# Patient Record
Sex: Female | Born: 1945 | Race: White | Hispanic: No | Marital: Married | Smoking: Never smoker
Health system: Southern US, Community
[De-identification: ages and names within clinical notes are randomized; demographics above are authoritative.]

## PROBLEM LIST (undated history)

## (undated) DIAGNOSIS — E1100 Type 2 diabetes mellitus with hyperosmolarity without nonketotic hyperglycemic-hyperosmolar coma (NKHHC): Secondary | ICD-10-CM

## (undated) DIAGNOSIS — A419 Sepsis, unspecified organism: Secondary | ICD-10-CM

## (undated) DIAGNOSIS — E079 Disorder of thyroid, unspecified: Secondary | ICD-10-CM

## (undated) DIAGNOSIS — M6282 Rhabdomyolysis: Secondary | ICD-10-CM

## (undated) DIAGNOSIS — I4891 Unspecified atrial fibrillation: Secondary | ICD-10-CM

## (undated) DIAGNOSIS — E119 Type 2 diabetes mellitus without complications: Secondary | ICD-10-CM

## (undated) DIAGNOSIS — N179 Acute kidney failure, unspecified: Secondary | ICD-10-CM

## (undated) HISTORY — PX: CHOLECYSTECTOMY: SHX55

---

## 1998-08-01 ENCOUNTER — Other Ambulatory Visit: Admission: RE | Admit: 1998-08-01 | Discharge: 1998-08-01 | Payer: Self-pay | Admitting: Gynecology

## 1999-02-18 ENCOUNTER — Encounter: Payer: Self-pay | Admitting: Gynecology

## 1999-02-18 ENCOUNTER — Encounter: Admission: RE | Admit: 1999-02-18 | Discharge: 1999-02-18 | Payer: Self-pay | Admitting: Gynecology

## 2002-01-25 ENCOUNTER — Encounter: Payer: Self-pay | Admitting: Gynecology

## 2002-01-25 ENCOUNTER — Encounter: Admission: RE | Admit: 2002-01-25 | Discharge: 2002-01-25 | Payer: Self-pay | Admitting: Gynecology

## 2014-04-17 ENCOUNTER — Inpatient Hospital Stay (HOSPITAL_COMMUNITY)
Admission: EM | Admit: 2014-04-17 | Discharge: 2014-04-25 | DRG: 871 | Disposition: A | Discharge status: Skilled Nursing Facility | Payer: Medicare Other | Attending: Internal Medicine | Admitting: Internal Medicine

## 2014-04-17 ENCOUNTER — Emergency Department (HOSPITAL_COMMUNITY): Payer: Medicare Other

## 2014-04-17 ENCOUNTER — Encounter (HOSPITAL_COMMUNITY): Payer: Self-pay | Admitting: Emergency Medicine

## 2014-04-17 DIAGNOSIS — Z833 Family history of diabetes mellitus: Secondary | ICD-10-CM | POA: Diagnosis not present

## 2014-04-17 DIAGNOSIS — B372 Candidiasis of skin and nail: Secondary | ICD-10-CM | POA: Diagnosis present

## 2014-04-17 DIAGNOSIS — A419 Sepsis, unspecified organism: Secondary | ICD-10-CM | POA: Diagnosis present

## 2014-04-17 DIAGNOSIS — I493 Ventricular premature depolarization: Secondary | ICD-10-CM | POA: Diagnosis present

## 2014-04-17 DIAGNOSIS — E663 Overweight: Secondary | ICD-10-CM | POA: Diagnosis present

## 2014-04-17 DIAGNOSIS — Y92009 Unspecified place in unspecified non-institutional (private) residence as the place of occurrence of the external cause: Secondary | ICD-10-CM

## 2014-04-17 DIAGNOSIS — E1100 Type 2 diabetes mellitus with hyperosmolarity without nonketotic hyperglycemic-hyperosmolar coma (NKHHC): Secondary | ICD-10-CM | POA: Diagnosis present

## 2014-04-17 DIAGNOSIS — W19XXXA Unspecified fall, initial encounter: Secondary | ICD-10-CM | POA: Diagnosis present

## 2014-04-17 DIAGNOSIS — E876 Hypokalemia: Secondary | ICD-10-CM | POA: Diagnosis present

## 2014-04-17 DIAGNOSIS — R7989 Other specified abnormal findings of blood chemistry: Secondary | ICD-10-CM

## 2014-04-17 DIAGNOSIS — Z6841 Body Mass Index (BMI) 40.0 and over, adult: Secondary | ICD-10-CM

## 2014-04-17 DIAGNOSIS — Z8249 Family history of ischemic heart disease and other diseases of the circulatory system: Secondary | ICD-10-CM

## 2014-04-17 DIAGNOSIS — N179 Acute kidney failure, unspecified: Secondary | ICD-10-CM | POA: Diagnosis present

## 2014-04-17 DIAGNOSIS — J189 Pneumonia, unspecified organism: Secondary | ICD-10-CM | POA: Diagnosis present

## 2014-04-17 DIAGNOSIS — N183 Chronic kidney disease, stage 3 (moderate): Secondary | ICD-10-CM | POA: Diagnosis present

## 2014-04-17 DIAGNOSIS — E872 Acidosis, unspecified: Secondary | ICD-10-CM

## 2014-04-17 DIAGNOSIS — M6282 Rhabdomyolysis: Secondary | ICD-10-CM

## 2014-04-17 DIAGNOSIS — I480 Paroxysmal atrial fibrillation: Secondary | ICD-10-CM | POA: Diagnosis present

## 2014-04-17 DIAGNOSIS — M79606 Pain in leg, unspecified: Secondary | ICD-10-CM

## 2014-04-17 DIAGNOSIS — D72829 Elevated white blood cell count, unspecified: Secondary | ICD-10-CM

## 2014-04-17 DIAGNOSIS — G9341 Metabolic encephalopathy: Secondary | ICD-10-CM | POA: Diagnosis present

## 2014-04-17 DIAGNOSIS — R531 Weakness: Secondary | ICD-10-CM

## 2014-04-17 DIAGNOSIS — I4891 Unspecified atrial fibrillation: Secondary | ICD-10-CM

## 2014-04-17 DIAGNOSIS — T796XXA Traumatic ischemia of muscle, initial encounter: Secondary | ICD-10-CM

## 2014-04-17 DIAGNOSIS — Z9111 Patient's noncompliance with dietary regimen: Secondary | ICD-10-CM | POA: Diagnosis present

## 2014-04-17 DIAGNOSIS — E860 Dehydration: Secondary | ICD-10-CM | POA: Diagnosis present

## 2014-04-17 DIAGNOSIS — I248 Other forms of acute ischemic heart disease: Secondary | ICD-10-CM | POA: Diagnosis present

## 2014-04-17 DIAGNOSIS — E870 Hyperosmolality and hypernatremia: Secondary | ICD-10-CM | POA: Diagnosis present

## 2014-04-17 DIAGNOSIS — R4182 Altered mental status, unspecified: Secondary | ICD-10-CM

## 2014-04-17 DIAGNOSIS — E1101 Type 2 diabetes mellitus with hyperosmolarity with coma: Secondary | ICD-10-CM

## 2014-04-17 DIAGNOSIS — R739 Hyperglycemia, unspecified: Secondary | ICD-10-CM

## 2014-04-17 DIAGNOSIS — R778 Other specified abnormalities of plasma proteins: Secondary | ICD-10-CM | POA: Diagnosis present

## 2014-04-17 DIAGNOSIS — R799 Abnormal finding of blood chemistry, unspecified: Secondary | ICD-10-CM

## 2014-04-17 DIAGNOSIS — E8729 Other acidosis: Secondary | ICD-10-CM

## 2014-04-17 HISTORY — DX: Disorder of thyroid, unspecified: E07.9

## 2014-04-17 HISTORY — DX: Type 2 diabetes mellitus with hyperosmolarity without nonketotic hyperglycemic-hyperosmolar coma (NKHHC): E11.00

## 2014-04-17 LAB — CBC WITH DIFFERENTIAL/PLATELET
BASOS ABS: 0 10*3/uL (ref 0.0–0.1)
Basophils Relative: 0 % (ref 0–1)
Eosinophils Absolute: 0 10*3/uL (ref 0.0–0.7)
Eosinophils Relative: 0 % (ref 0–5)
HEMATOCRIT: 46.8 % — AB (ref 36.0–46.0)
Hemoglobin: 15.2 g/dL — ABNORMAL HIGH (ref 12.0–15.0)
LYMPHS ABS: 1.2 10*3/uL (ref 0.7–4.0)
LYMPHS PCT: 4 % — AB (ref 12–46)
MCH: 30.2 pg (ref 26.0–34.0)
MCHC: 32.5 g/dL (ref 30.0–36.0)
MCV: 92.9 fL (ref 78.0–100.0)
Monocytes Absolute: 1.2 10*3/uL — ABNORMAL HIGH (ref 0.1–1.0)
Monocytes Relative: 4 % (ref 3–12)
NEUTROS ABS: 26.8 10*3/uL — AB (ref 1.7–7.7)
Neutrophils Relative %: 92 % — ABNORMAL HIGH (ref 43–77)
Platelets: 485 10*3/uL — ABNORMAL HIGH (ref 150–400)
RBC: 5.04 MIL/uL (ref 3.87–5.11)
RDW: 14.2 % (ref 11.5–15.5)
WBC: 29.2 10*3/uL — ABNORMAL HIGH (ref 4.0–10.5)

## 2014-04-17 LAB — BLOOD GAS, VENOUS
Acid-Base Excess: 4 mmol/L — ABNORMAL HIGH (ref 0.0–2.0)
BICARBONATE: 28.3 meq/L — AB (ref 20.0–24.0)
Drawn by: 426371
FIO2: 0.21 %
O2 Saturation: 60.1 %
PCO2 VEN: 41.9 mmHg — AB (ref 45.0–50.0)
PH VEN: 7.442 — AB (ref 7.250–7.300)
Patient temperature: 97.8
TCO2: 24.5 mmol/L (ref 0–100)

## 2014-04-17 LAB — URINALYSIS, ROUTINE W REFLEX MICROSCOPIC
Bilirubin Urine: NEGATIVE
Glucose, UA: >1000 mg/dL — AB
Hgb urine dipstick: NEGATIVE
Ketones, ur: NEGATIVE mg/dL
Leukocytes, UA: NEGATIVE
Nitrite: NEGATIVE
Protein, ur: NEGATIVE mg/dL
SPECIFIC GRAVITY, URINE: 1.028 (ref 1.005–1.030)
UROBILINOGEN UA: 1 mg/dL (ref 0.0–1.0)
pH: 5.5 (ref 5.0–8.0)

## 2014-04-17 LAB — CBG MONITORING, ED: Glucose-Capillary: >600 mg/dL (ref 70–99)

## 2014-04-17 LAB — TROPONIN I: Troponin I: <0.3 ng/mL (ref <0.30)

## 2014-04-17 LAB — COMPREHENSIVE METABOLIC PANEL
ALT: 17 U/L (ref 0–35)
AST: 44 U/L — AB (ref 0–37)
Albumin: 2.6 g/dL — ABNORMAL LOW (ref 3.5–5.2)
Alkaline Phosphatase: 155 U/L — ABNORMAL HIGH (ref 39–117)
Anion gap: 25 — ABNORMAL HIGH (ref 5–15)
BILIRUBIN TOTAL: 1.1 mg/dL (ref 0.3–1.2)
BUN: 17 mg/dL (ref 6–23)
CO2: 25 mEq/L (ref 19–32)
Calcium: 9.4 mg/dL (ref 8.4–10.5)
Chloride: 85 mEq/L — ABNORMAL LOW (ref 96–112)
Creatinine, Ser: 1.92 mg/dL — ABNORMAL HIGH (ref 0.50–1.10)
GFR calc Af Amer: 30 mL/min — ABNORMAL LOW (ref >90)
GFR, EST NON AFRICAN AMERICAN: 26 mL/min — AB (ref >90)
Glucose, Bld: 953 mg/dL (ref 70–99)
Potassium: 2.4 mEq/L — CL (ref 3.7–5.3)
Sodium: 135 mEq/L — ABNORMAL LOW (ref 137–147)
Total Protein: 7.5 g/dL (ref 6.0–8.3)

## 2014-04-17 LAB — PROTIME-INR
INR: 1.49 (ref 0.00–1.49)
Prothrombin Time: 18.2 seconds — ABNORMAL HIGH (ref 11.6–15.2)

## 2014-04-17 LAB — URINE MICROSCOPIC-ADD ON

## 2014-04-17 LAB — PRO B NATRIURETIC PEPTIDE: PRO B NATRI PEPTIDE: 7693 pg/mL — AB (ref 0–125)

## 2014-04-17 LAB — CK: Total CK: 1917 U/L — ABNORMAL HIGH (ref 7–177)

## 2014-04-17 LAB — I-STAT CG4 LACTIC ACID, ED: LACTIC ACID, VENOUS: 8.74 mmol/L — AB (ref 0.5–2.2)

## 2014-04-17 MED ORDER — SODIUM CHLORIDE 0.9 % IV BOLUS (SEPSIS)
1000.0000 mL | Freq: Once | INTRAVENOUS | Status: AC
  Administered 2014-04-17: 1000 mL via INTRAVENOUS

## 2014-04-17 MED ORDER — PIPERACILLIN-TAZOBACTAM 3.375 G IVPB 30 MIN
3.3750 g | Freq: Once | INTRAVENOUS | Status: AC
  Administered 2014-04-17: 3.375 g via INTRAVENOUS
  Filled 2014-04-17: qty 50

## 2014-04-17 MED ORDER — SODIUM CHLORIDE 0.9 % IV SOLN
INTRAVENOUS | Status: AC
  Administered 2014-04-17: 5.4 [IU]/h via INTRAVENOUS
  Filled 2014-04-17: qty 2.5

## 2014-04-17 MED ORDER — DEXTROSE-NACL 5-0.45 % IV SOLN
INTRAVENOUS | Status: DC
  Administered 2014-04-18: 50 mL/h via INTRAVENOUS
  Administered 2014-04-19: 02:00:00 via INTRAVENOUS

## 2014-04-17 MED ORDER — VANCOMYCIN HCL IN DEXTROSE 1-5 GM/200ML-% IV SOLN
1000.0000 mg | Freq: Once | INTRAVENOUS | Status: AC
  Administered 2014-04-17: 1000 mg via INTRAVENOUS
  Filled 2014-04-17: qty 200

## 2014-04-17 NOTE — ED Notes (Signed)
CBG READ HIGH

## 2014-04-17 NOTE — ED Provider Notes (Signed)
CSN: [id number]     Arrival date & time 04/17/14  1932 History   First MD Initiated Contact with Patient 04/17/14 1938     Chief Complaint  Patient presents with  . Fall  . Hyperglycemia     (Consider location/radiation/quality/duration/timing/severity/associated sxs/prior Treatment) HPI Comments: Fall at home - was unable to get up because she was too weak. Per husband, has been sick for a while, hasn't been eating well, stopped eating food, is only drinking.   Patient is a 68 y.o. female presenting with fall and hyperglycemia. The history is provided by the patient.  Fall This is a new problem. The current episode started 6 to 12 hours ago. The problem occurs constantly. The problem has been resolved. Pertinent negatives include no chest pain, no abdominal pain and no shortness of breath. Nothing aggravates the symptoms. Nothing relieves the symptoms. She has tried nothing for the symptoms.  Hyperglycemia Associated symptoms: no abdominal pain, no chest pain, no fever, no shortness of breath and no vomiting     Past Medical History  Diagnosis Date  . Thyroid disease    Past Surgical History  Procedure Laterality Date  . Cholecystectomy     Family History  Problem Relation Age of Onset  . Osteoarthritis Mother   . Cancer Mother   . Diabetes Mother   . Heart failure Mother   . Hypertension Mother   . Osteoarthritis Father   . Cancer Father   . Osteoarthritis Sister   . Cancer Sister   . Osteoarthritis Brother   . Cancer Brother    History  Substance Use Topics  . Smoking status: Never Smoker   . Smokeless tobacco: Not on file  . Alcohol Use: No   OB History    No data available     Review of Systems  Constitutional: Negative for fever.  Respiratory: Negative for cough and shortness of breath.   Cardiovascular: Negative for chest pain.  Gastrointestinal: Negative for vomiting and abdominal pain.  Musculoskeletal: Positive for back pain.  All other systems  reviewed and are negative.     Allergies  Review of patient's allergies indicates not on file.  Home Medications   Prior to Admission medications   Not on File   BP 157/133 mmHg  Pulse 79  Temp(Src) 97.8 F (36.6 C) (Oral)  Resp 22  Ht 5\' 7"  (1.702 m)  Wt 300 lb (136.079 kg)  BMI 46.98 kg/m2  SpO2 92% Physical Exam  Constitutional: She is oriented to person, place, and time. She appears well-developed and well-nourished. No distress.  HENT:  Head: Normocephalic and atraumatic.  Mouth/Throat: Oropharynx is clear and moist.  Eyes: EOM are normal. Pupils are equal, round, and reactive to light.  Neck: Normal range of motion. Neck supple.  Cardiovascular: Normal rate and regular rhythm.  Exam reveals no friction rub.   No murmur heard. Pulmonary/Chest: Effort normal and breath sounds normal. No respiratory distress. She has no wheezes. She has no rales.  Abdominal: Soft. She exhibits no distension. There is no tenderness. There is no rebound.  Musculoskeletal: Normal range of motion. She exhibits no edema.       Cervical back: She exhibits no bony tenderness.       Thoracic back: She exhibits no bony tenderness.       Lumbar back: She exhibits no bony tenderness.  Neurological: She is alert and oriented to person, place, and time.  Skin: She is not diaphoretic.  Nursing note and vitals reviewed.  ED Course  Procedures (including critical care time) Labs Review Labs Reviewed  CULTURE, BLOOD (ROUTINE X 2)  CULTURE, BLOOD (ROUTINE X 2)  URINE CULTURE  COMPREHENSIVE METABOLIC PANEL  TROPONIN I  CBC WITH DIFFERENTIAL  PROTIME-INR  BLOOD GAS, VENOUS  PRO B NATRIURETIC PEPTIDE  URINALYSIS, ROUTINE W REFLEX MICROSCOPIC  TSH  I-STAT CG4 LACTIC ACID, ED    Imaging Review Dg Chest 2 View  04/17/2014   CLINICAL DATA:  Pt arrived via EMS with report of falling without injury d/t "legs given out" on concrete garage floor since 1300 and was found by spouse ast 1845. EMS  reported that CBG was greater than 600, poor appetite, noncompliant with diet d/t noted soft drinks and sweeten tea in house, strong urine smell, and red rash to abd folds. Pt c/o lower back pain, large bruising to left hip and groin, denies any chest complaints, best obtainable images due to pt size and condition  EXAM: CHEST  2 VIEW  COMPARISON:  None.  FINDINGS: There are low lung volumes with patient rotation to the right. The lateral view is limited. There is elevation of the right hemidiaphragm. The aorta appears ectatic and somewhat unfolded. The heart size is normal. Mild atelectasis is present at the right lung base. There is no consolidation, significant pleural effusion or pneumothorax. No acute osseous findings identified.  IMPRESSION: Elevated right hemidiaphragm with associated right basilar atelectasis. No consolidation or significant edema.   Electronically Signed   By: Roxy Horseman M.D.   On: 04/17/2014 21:36   Dg Lumbar Spine 2-3 Views  04/17/2014   CLINICAL DATA:  Fall.  Weakness.  Initial evaluation.  EXAM: LUMBAR SPINE - 2-3 VIEW  COMPARISON:  None.  FINDINGS: Five lumbar type vertebral bodies are present. There is mild levoscoliosis. Vertebral bodies are otherwise normally aligned with preservation of the normal lumbar lordosis.  There is anterior wedging of the L1 and L2 vertebral bodies, favored to be chronic in nature. Vertebral body heights are otherwise preserved. No definite acute fracture listhesis.  No soft tissue abnormality.  Moderate multilevel degenerative disc disease present within the visualized spine.  IMPRESSION: 1. No definite acute traumatic injury within the lumbar spine. 2. Anterior wedging of the L1 and L2 vertebral bodies, favored to be chronic in nature. Possible acute compression fractures are not entirely excluded. Correlation with site of pain recommended.   Electronically Signed   By: Rise Mu M.D.   On: 04/17/2014 21:37   Dg Pelvis 1-2  Views  04/17/2014   CLINICAL DATA:  Patient fell on concrete floor earlier in the day  EXAM: PELVIS - 1-2 VIEW  COMPARISON:  None.  FINDINGS: There is no evidence of pelvic fracture or dislocation. 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 No erosive change.  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 No fracture or dislocation.   Electronically Signed   By: Bretta Bang M.D.   On: 04/17/2014 21:37   Dg Tibia/fibula Left  04/17/2014   CLINICAL DATA:  Status post fall; acute onset of left lower leg pain. Initial encounter.  EXAM: LEFT TIBIA AND FIBULA - 2 VIEW  COMPARISON:  None.  FINDINGS: There is no evidence of fracture or dislocation. The tibia and fibula appear intact.  Marginal osteophytes are seen arising at the medial and lateral compartments of the knee, with wall osteophytes  also seen. No knee joint effusion is identified.  The ankle mortise is incompletely assessed, but appears grossly unremarkable. A plantar calcaneal spur is incidentally noted. No significant soft tissue abnormalities are identified.  IMPRESSION: No evidence of fracture or dislocation.   Electronically Signed   By: Roanna RaiderJeffery  Chang M.D.   On: 04/17/2014 22:52     EKG Interpretation   Date/Time:  Monday April 17 2014 19:37:09 EST Ventricular Rate:  143 PR Interval:  191 QRS Duration: 79 QT Interval:  309 QTC Calculation: 477 R Axis:   -14 Text Interpretation:  Atrial fibrillation with RVR Paired ventricular  premature complexes Inferior infarct, old Anterior infarct, old Lateral  leads are also involved No prior EKG for comparision Confirmed by Gwendolyn GrantWALDEN   MD, Lacosta Hargan [phone]([id number]) on 04/17/2014 8:43:06 PM     Angiocath insertion Performed by: Dagmar HaitWALDEN, WILLIAM Deanette Tullius  Consent: Verbal consent obtained. Risks and benefits: risks, benefits and alternatives were discussed Time out:  Immediately prior to procedure a "time out" was called to verify the correct patient, procedure, equipment, support staff and site/side marked as required.  Preparation: Patient was prepped and draped in the usual sterile fashion.  Vein Location: L AC  Yes Ultrasound Guided  Gauge: 20  Normal blood return and flush without difficulty Patient tolerance: Patient tolerated the procedure well with no immediate complications.   CRITICAL CARE Performed by: Dagmar HaitWALDEN, WILLIAM Macguire Holsinger   Total critical care time: 40 minutes  Critical care time was exclusive of separately billable procedures and treating other patients.  Critical care was necessary to treat or prevent imminent or life-threatening deterioration.  Critical care was time spent personally by me on the following activities: development of treatment plan with patient and/or surrogate as well as nursing, discussions with consultants, evaluation of patient's response to treatment, examination of patient, obtaining history from patient or surrogate, ordering and performing treatments and interventions, ordering and review of laboratory studies, ordering and review of radiographic studies, pulse oximetry and re-evaluation of patient's condition.  MDM   Final diagnoses:  Weakness  Leg pain  HHNC (hyperglycemic hyperosmolar nonketotic coma)  Hyperglycemia  Hypokalemia    86F here after a fall at home. Was too weak to get up. Spent about 6 hours on the floor, only states lower back pain. No preceding symptoms prior to the fall. Patient here with Afib with RVR on the monitor. Patient has no history other than thyroid disease, no PCP, is on no medications. No abdominal pain, no SOB, no CP. Plan on sepsis workup. Will resuscitate prior to starting diltiazem.  Lactate elevated at 8.7. Antibiotics immediately initiated. HR improving with fluids, patient's BP remain soft. Labs show hyperglycemia, hypokalemia. Sugar markedly elevated in the  900s. I spoke with Critical Care who are unable to come over immediately, but did recommended ICU admission. They will be available for consult via the video link. Dr. Konrad DoloresMerrell with the hospitalists with admit to ICU. IV started by me to make for 3 points of access.   Elwin MochaBlair Maximino Cozzolino, MD 04/17/14 2325

## 2014-04-17 NOTE — ED Notes (Signed)
Bed: WA12 Expected date:  Expected time:  Means of arrival:  Comments: Fall 

## 2014-04-17 NOTE — ED Notes (Signed)
Portable xray at bedside.

## 2014-04-17 NOTE — ED Notes (Addendum)
Pt's entire gorin area and abd folds with redness, irritation and lg amt of yeast noted and foul odor.

## 2014-04-17 NOTE — ED Notes (Signed)
Pt arrived via EMS with report of falling without injury d/t "legs given out" on concrete garage floor since 1300 and was found by spouse ast 1845. EMS reported that CBG was greater than 600, poor appetite, noncompliant with diet d/t noted soft drinks and sweeten tea in house, strong urine smell, and red rash to abd folds.

## 2014-04-17 NOTE — ED Notes (Signed)
Patient transported to X-ray 

## 2014-04-17 NOTE — ED Notes (Addendum)
Awake. Verbally responsive. A/O x4. Resp even and unlabored. No audible adventitious breath sounds noted. ABC's intact. A.Fib at 113 on monitor. Family at bedside. Pt had no adverse reaction from IV ABT.

## 2014-04-17 NOTE — H&P (Signed)
[facility] History and Physical  Natalie Renshawlma C Vannote ONG:[id number] DOB: 09/10/1945 DOA: 04/17/2014  Referring physician: Dr. Ellin SabaWalden - [facility] PCP: No primary care provider on file.   Chief Complaint: Fall  HPI: [name] is a 68 y.o. female  Pt reports becoming weak and falling at home several hours ago.  Pt reports becoming weak and having her legs give way while she was walking. Denies striking head, LOC, tongue biting, loss of bowel or bladder function, fevers, CP, SOB, Abd pain, nause or vomiting. Pt has become progressively weaker over the past several weeks. Associated w/ polyuria and polydipsia and anorexia.   LEVEL 5 CAVEAT - pt only able to remember small parts of history.  Much of history obtained from family members in the room at time of admission Family states that pt has been getting progressively sicker for 3-4 years and is in a poor home situation (per brother).   Review of Systems:  Per HPI. Pt altered   Past Medical History  Diagnosis Date  . Thyroid disease    Past Surgical History  Procedure Laterality Date  . Cholecystectomy     Social History:  reports that she has never smoked. She does not have any smokeless tobacco history on file. She reports that she does not drink alcohol or use illicit drugs.  No Known Allergies  Family History  Problem Relation Age of Onset  . Osteoarthritis Mother   . Cancer Mother   . Diabetes Mother   . Heart failure Mother   . Hypertension Mother   . Osteoarthritis Father   . Cancer Father   . Osteoarthritis Sister   . Cancer Sister   . Osteoarthritis Brother   . Cancer Brother      Prior to Admission medications   Not on File   Physical Exam: Filed Vitals:   04/17/14 2200 04/17/14 2230 04/17/14 2300 04/17/14 2330  BP: 96/70 85/53 81/53  124/69  Pulse: 113 84 107 105  Temp:      TempSrc:      Resp: 29  27   Height:      Weight:      SpO2: 96% 97% 95% 96%    Wt Readings from Last 3 Encounters:  04/17/14  136.079 kg (300 lb)    General: Appears uncomfortable  Eyes:  PERRL, normal lids, irises & conjunctiva ENT: Extremely dry mucus membranes Neck:  no LAD, masses or thyromegaly Cardiovascular: II/VI systolic murmur, irregularly irregular.  Trace LE edema  Telemetry: A-Fib Respiratory:  CTA bilaterally, no w/r/r. Normal respiratory effort. Abdomen:  soft, ntnd Skin:  no rash or induration seen on limited exam Musculoskeletal:  grossly normal tone BUE/BLE Psychiatric: AAO to person and month only  Neurologic:  grossly non-focal.          Labs on Admission:  Basic Metabolic Panel:  Recent Labs Lab 04/17/14 2033  NA 135*  K 2.4*  CL 85*  CO2 25  GLUCOSE 953*  BUN 17  CREATININE 1.92*  CALCIUM 9.4   Liver Function Tests:  Recent Labs Lab 04/17/14 2033  AST 44*  ALT 17  ALKPHOS 155*  BILITOT 1.1  PROT 7.5  ALBUMIN 2.6*   No results for input(s): LIPASE, AMYLASE in the last 168 hours. No results for input(s): AMMONIA in the last 168 hours. CBC:  Recent Labs Lab 04/17/14 2033  WBC 29.2*  NEUTROABS 26.8*  HGB 15.2*  HCT 46.8*  MCV 92.9  PLT 485*   Cardiac Enzymes:  Recent  Labs Lab 04/17/14 2033  CKTOTAL 1917*  TROPONINI <0.30    BNP (last 3 results)  Recent Labs  04/17/14 2033  PROBNP 7693.0*   CBG:  Recent Labs Lab 04/17/14 2344  GLUCAP >600*    Radiological Exams on Admission: Dg Chest 2 View  04/17/2014   CLINICAL DATA:  Pt arrived via EMS with report of falling without injury d/t "legs given out" on concrete garage floor since 1300 and was found by spouse ast 1845. EMS reported that CBG was greater than 600, poor appetite, noncompliant with diet d/t noted soft drinks and sweeten tea in house, strong urine smell, and red rash to abd folds. Pt c/o lower back pain, large bruising to left hip and groin, denies any chest complaints, best obtainable images due to pt size and condition  EXAM: CHEST  2 VIEW  COMPARISON:  None.  FINDINGS: There  are low lung volumes with patient rotation to the right. The lateral view is limited. There is elevation of the right hemidiaphragm. The aorta appears ectatic and somewhat unfolded. The heart size is normal. Mild atelectasis is present at the right lung base. There is no consolidation, significant pleural effusion or pneumothorax. No acute osseous findings identified.  IMPRESSION: Elevated right hemidiaphragm with associated right basilar atelectasis. No consolidation or significant edema.   Electronically Signed   By: Roxy HorsemanBill  Veazey M.D.   On: 04/17/2014 21:36   Dg Lumbar Spine 2-3 Views  04/17/2014   CLINICAL DATA:  Fall.  Weakness.  Initial evaluation.  EXAM: LUMBAR SPINE - 2-3 VIEW  COMPARISON:  None.  FINDINGS: Five lumbar type vertebral bodies are present. There is mild levoscoliosis. Vertebral bodies are otherwise normally aligned with preservation of the normal lumbar lordosis.  There is anterior wedging of the L1 and L2 vertebral bodies, favored to be chronic in nature. Vertebral body heights are otherwise preserved. No definite acute fracture listhesis.  No soft tissue abnormality.  Moderate multilevel degenerative disc disease present within the visualized spine.  IMPRESSION: 1. No definite acute traumatic injury within the lumbar spine. 2. Anterior wedging of the L1 and L2 vertebral bodies, favored to be chronic in nature. Possible acute compression fractures are not entirely excluded. Correlation with site of pain recommended.   Electronically Signed   By: Rise MuBenjamin  McClintock M.D.   On: 04/17/2014 21:37   Dg Pelvis 1-2 Views  04/17/2014   CLINICAL DATA:  Patient fell on concrete floor earlier in the day  EXAM: PELVIS - 1-2 VIEW  COMPARISON:  None.  FINDINGS: There is no evidence of pelvic fracture or dislocation. 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 No erosive change.  IMPRESSION: Evidence of osteoarthritic change in the hip  joints, slightly more severe on the right than on the left. No fracture or dislocation.   Electronically Signed   By: Bretta BangWilliam  Woodruff M.D.   On: 04/17/2014 21:37   Dg Tibia/fibula Left  04/17/2014   CLINICAL DATA:  Status post fall; acute onset of left lower leg pain. Initial encounter.  EXAM: LEFT TIBIA AND FIBULA - 2 VIEW  COMPARISON:  None.  FINDINGS: There is no evidence of fracture or dislocation. The tibia and fibula appear intact.  Marginal osteophytes are seen arising at the medial and lateral compartments of the knee, with wall osteophytes also seen. No knee joint effusion is identified.  The ankle mortise is incompletely assessed, but appears grossly unremarkable. A plantar calcaneal spur  is incidentally noted. No significant soft tissue abnormalities are identified.  IMPRESSION: No evidence of fracture or dislocation.   Electronically Signed   By: Roanna Raider M.D.   On: 04/17/2014 22:52    EKG: Independently reviewed. Afib, no ACS, old ant infarct  Assessment/Plan Principal Problem:   Sepsis Active Problems:   Hyperosmolar non-ketotic state in patient with type 2 diabetes mellitus   Elevated brain natriuretic peptide (BNP) level   Atrial fibrillation with RVR   Leukocytosis   Hypokalemia   Rhabdomyolysis   AKI (acute kidney injury)   High anion gap metabolic acidosis   Lactic acidosis   Altered mental status  Sepsis: unknown source. Pt altered on admission. Pt unable to reliably participate in admission process. Process likely complicated by severe HHNK state. CCM consulted and will review pts care. CXR w/o sign of acute infection. Tachycardic, tachypnea, hypotensive, Lacitc acid 8.7, WBC 29.2. Ammonia nml - Admit to ICU - Vanc/Zosyn - BCX and UCX sent - ABD CT - hypoactive BS and abd discomfort on palpation w/ altered pt  - Repeat lactic acid - lipase - ammonia  HHNK: Glucose 953 on admission. No h/o DM. No ketones in urine.  - Glucose stabilizer - A1c - D5 NS  K 153ml/hr.  - BMET Q2  Hypokalemia: 2.4 on admission. Anticipate continued drop as HHNK corrects. Unable to take PO. Anion Gap 33 (25 w/o Na correction) - K in D5 1/2 NS and piggyback ( x6) - Mag level - 2gm Mag  CHF: BNP - [id number]. Not likely in acute failure. Possible false elevation from AKI. Troponin neg. - Echo - I/O - Daily wts  AKI: Cr 1.92. No previous value to compare. Likely from longstanding undiagnosed DM - IVF as above - trend  Afib: RVR vs dehydration. Likely due acute myocardial stress from extreme hyperglycemic stress vs paroxysmal. Improving w/ fluids alone - Consider starting dilt if BP improves and HR remains elevated - Cardiology consult in the am.  - Heparin  Social: pt seen and examined w/ pts brother in the room. Husband had left for the day. There is concern by the brother that the pt is not cared for well at home. States that the pts husband is not allowing the pt to get the proper medical care that is needed adn has been in physical decline for some time.  - social work  Code Status: FULL DVT Prophylaxis: Heparin Family Communication: Brother and brother in Social worker Disposition Plan: pending improvement   Fumio Vandam J [email or web address] Password [email or web address]

## 2014-04-18 ENCOUNTER — Inpatient Hospital Stay (HOSPITAL_COMMUNITY): Payer: Medicare Other

## 2014-04-18 DIAGNOSIS — E872 Acidosis, unspecified: Secondary | ICD-10-CM

## 2014-04-18 DIAGNOSIS — M6282 Rhabdomyolysis: Secondary | ICD-10-CM

## 2014-04-18 DIAGNOSIS — A419 Sepsis, unspecified organism: Secondary | ICD-10-CM

## 2014-04-18 DIAGNOSIS — R531 Weakness: Secondary | ICD-10-CM | POA: Insufficient documentation

## 2014-04-18 DIAGNOSIS — E1101 Type 2 diabetes mellitus with hyperosmolarity with coma: Secondary | ICD-10-CM | POA: Insufficient documentation

## 2014-04-18 DIAGNOSIS — D72829 Elevated white blood cell count, unspecified: Secondary | ICD-10-CM

## 2014-04-18 DIAGNOSIS — N179 Acute kidney failure, unspecified: Secondary | ICD-10-CM

## 2014-04-18 DIAGNOSIS — R778 Other specified abnormalities of plasma proteins: Secondary | ICD-10-CM | POA: Diagnosis present

## 2014-04-18 DIAGNOSIS — E8729 Other acidosis: Secondary | ICD-10-CM

## 2014-04-18 DIAGNOSIS — B372 Candidiasis of skin and nail: Secondary | ICD-10-CM | POA: Diagnosis present

## 2014-04-18 DIAGNOSIS — I4891 Unspecified atrial fibrillation: Secondary | ICD-10-CM

## 2014-04-18 DIAGNOSIS — R739 Hyperglycemia, unspecified: Secondary | ICD-10-CM | POA: Insufficient documentation

## 2014-04-18 DIAGNOSIS — R7989 Other specified abnormal findings of blood chemistry: Secondary | ICD-10-CM | POA: Diagnosis present

## 2014-04-18 DIAGNOSIS — E876 Hypokalemia: Secondary | ICD-10-CM

## 2014-04-18 DIAGNOSIS — R4182 Altered mental status, unspecified: Secondary | ICD-10-CM

## 2014-04-18 DIAGNOSIS — I517 Cardiomegaly: Secondary | ICD-10-CM

## 2014-04-18 HISTORY — DX: Acute kidney failure, unspecified: N17.9

## 2014-04-18 HISTORY — DX: Sepsis, unspecified organism: A41.9

## 2014-04-18 HISTORY — DX: Rhabdomyolysis: M62.82

## 2014-04-18 LAB — BASIC METABOLIC PANEL
ANION GAP: 16 — AB (ref 5–15)
ANION GAP: 15 (ref 5–15)
ANION GAP: 12 (ref 5–15)
ANION GAP: 12 (ref 5–15)
Anion gap: 13 (ref 5–15)
Anion gap: 12 (ref 5–15)
BUN: 21 mg/dL (ref 6–23)
BUN: 21 mg/dL (ref 6–23)
BUN: 21 mg/dL (ref 6–23)
BUN: 22 mg/dL (ref 6–23)
BUN: 23 mg/dL (ref 6–23)
BUN: 23 mg/dL (ref 6–23)
CALCIUM: 7.7 mg/dL — AB (ref 8.4–10.5)
CALCIUM: 7.9 mg/dL — AB (ref 8.4–10.5)
CALCIUM: 8 mg/dL — AB (ref 8.4–10.5)
CHLORIDE: 105 meq/L (ref 96–112)
CHLORIDE: 104 meq/L (ref 96–112)
CHLORIDE: 109 meq/L (ref 96–112)
CHLORIDE: 108 meq/L (ref 96–112)
CO2: 27 mmol/L (ref 19–32)
CO2: 23 mmol/L (ref 19–32)
CO2: 25 mmol/L (ref 19–32)
CO2: 25 mmol/L (ref 19–32)
CO2: 24 mmol/L (ref 19–32)
CO2: 25 mmol/L (ref 19–32)
CREATININE: 2.55 mg/dL — AB (ref 0.50–1.10)
CREATININE: 2.58 mg/dL — AB (ref 0.50–1.10)
CREATININE: 2.73 mg/dL — AB (ref 0.50–1.10)
CREATININE: 2.64 mg/dL — AB (ref 0.50–1.10)
Calcium: 8 mg/dL — ABNORMAL LOW (ref 8.4–10.5)
Calcium: 8.1 mg/dL — ABNORMAL LOW (ref 8.4–10.5)
Calcium: 7.6 mg/dL — ABNORMAL LOW (ref 8.4–10.5)
Chloride: 98 mEq/L (ref 96–112)
Chloride: 106 mEq/L (ref 96–112)
Creatinine, Ser: 2.58 mg/dL — ABNORMAL HIGH (ref 0.50–1.10)
Creatinine, Ser: 2.45 mg/dL — ABNORMAL HIGH (ref 0.50–1.10)
GFR calc Af Amer: 22 mL/min — ABNORMAL LOW (ref >90)
GFR calc non Af Amer: 19 mL/min — ABNORMAL LOW (ref >90)
GFR calc non Af Amer: 18 mL/min — ABNORMAL LOW (ref >90)
GFR calc non Af Amer: 18 mL/min — ABNORMAL LOW (ref >90)
GFR calc non Af Amer: 17 mL/min — ABNORMAL LOW (ref >90)
GFR, EST AFRICAN AMERICAN: 19 mL/min — AB (ref >90)
GFR, EST AFRICAN AMERICAN: 20 mL/min — AB (ref >90)
GFR, EST AFRICAN AMERICAN: 21 mL/min — AB (ref >90)
GFR, EST AFRICAN AMERICAN: 21 mL/min — AB (ref >90)
GFR, EST AFRICAN AMERICAN: 21 mL/min — AB (ref >90)
GFR, EST NON AFRICAN AMERICAN: 17 mL/min — AB (ref >90)
GFR, EST NON AFRICAN AMERICAN: 18 mL/min — AB (ref >90)
GLUCOSE: 822 mg/dL — AB (ref 70–99)
Glucose, Bld: 353 mg/dL — ABNORMAL HIGH (ref 70–99)
Glucose, Bld: 281 mg/dL — ABNORMAL HIGH (ref 70–99)
Glucose, Bld: 204 mg/dL — ABNORMAL HIGH (ref 70–99)
Glucose, Bld: 116 mg/dL — ABNORMAL HIGH (ref 70–99)
Glucose, Bld: 131 mg/dL — ABNORMAL HIGH (ref 70–99)
POTASSIUM: 2.2 mmol/L — AB (ref 3.5–5.1)
POTASSIUM: 2.3 mmol/L — AB (ref 3.5–5.1)
Potassium: 2.1 mmol/L — CL (ref 3.5–5.1)
Potassium: 2.8 mmol/L — ABNORMAL LOW (ref 3.5–5.1)
Potassium: 2.8 mmol/L — ABNORMAL LOW (ref 3.5–5.1)
Potassium: 3 mmol/L — ABNORMAL LOW (ref 3.5–5.1)
Sodium: 138 mmol/L (ref 135–145)
Sodium: 144 mmol/L (ref 135–145)
Sodium: 144 mmol/L (ref 135–145)
Sodium: 143 mmol/L (ref 135–145)
Sodium: 145 mmol/L (ref 135–145)
Sodium: 145 mmol/L (ref 135–145)

## 2014-04-18 LAB — TSH: TSH: 3.143 u[IU]/mL (ref 0.350–4.500)

## 2014-04-18 LAB — GLUCOSE, CAPILLARY
GLUCOSE-CAPILLARY: 317 mg/dL — AB (ref 70–99)
GLUCOSE-CAPILLARY: 295 mg/dL — AB (ref 70–99)
GLUCOSE-CAPILLARY: 168 mg/dL — AB (ref 70–99)
GLUCOSE-CAPILLARY: 121 mg/dL — AB (ref 70–99)
GLUCOSE-CAPILLARY: 119 mg/dL — AB (ref 70–99)
Glucose-Capillary: 347 mg/dL — ABNORMAL HIGH (ref 70–99)
Glucose-Capillary: 426 mg/dL — ABNORMAL HIGH (ref 70–99)
Glucose-Capillary: >600 mg/dL (ref 70–99)
Glucose-Capillary: >600 mg/dL (ref 70–99)
Glucose-Capillary: >600 mg/dL (ref 70–99)
Glucose-Capillary: 191 mg/dL — ABNORMAL HIGH (ref 70–99)
Glucose-Capillary: >600 mg/dL (ref 70–99)
Glucose-Capillary: 188 mg/dL — ABNORMAL HIGH (ref 70–99)
Glucose-Capillary: 186 mg/dL — ABNORMAL HIGH (ref 70–99)
Glucose-Capillary: 136 mg/dL — ABNORMAL HIGH (ref 70–99)
Glucose-Capillary: 141 mg/dL — ABNORMAL HIGH (ref 70–99)
Glucose-Capillary: 147 mg/dL — ABNORMAL HIGH (ref 70–99)
Glucose-Capillary: 152 mg/dL — ABNORMAL HIGH (ref 70–99)

## 2014-04-18 LAB — CBC
HEMATOCRIT: 39.3 % (ref 36.0–46.0)
HEMOGLOBIN: 13.3 g/dL (ref 12.0–15.0)
MCH: 30.2 pg (ref 26.0–34.0)
MCHC: 33.8 g/dL (ref 30.0–36.0)
MCV: 89.1 fL (ref 78.0–100.0)
Platelets: 306 10*3/uL (ref 150–400)
RBC: 4.41 MIL/uL (ref 3.87–5.11)
RDW: 13.7 % (ref 11.5–15.5)
WBC: 23.5 10*3/uL — ABNORMAL HIGH (ref 4.0–10.5)

## 2014-04-18 LAB — TYPE AND SCREEN
ABO/RH(D): AB POS
Antibody Screen: NEGATIVE

## 2014-04-18 LAB — TROPONIN I
TROPONIN I: 0.21 ng/mL — AB (ref <0.031)
Troponin I: 0.18 ng/mL — ABNORMAL HIGH (ref <0.031)
Troponin I: 0.27 ng/mL — ABNORMAL HIGH (ref <0.031)
Troponin I: 0.28 ng/mL — ABNORMAL HIGH (ref <0.031)

## 2014-04-18 LAB — LIPASE, BLOOD: LIPASE: 29 U/L (ref 11–59)

## 2014-04-18 LAB — RAPID URINE DRUG SCREEN, HOSP PERFORMED
Amphetamines: NOT DETECTED
Barbiturates: NOT DETECTED
Benzodiazepines: NOT DETECTED
Cocaine: NOT DETECTED
Opiates: POSITIVE — AB
TETRAHYDROCANNABINOL: NOT DETECTED

## 2014-04-18 LAB — APTT: aPTT: 36 seconds (ref 24–37)

## 2014-04-18 LAB — MRSA PCR SCREENING: MRSA BY PCR: NEGATIVE

## 2014-04-18 LAB — ABO/RH: ABO/RH(D): AB POS

## 2014-04-18 LAB — POTASSIUM: Potassium: 3.4 mmol/L — ABNORMAL LOW (ref 3.5–5.1)

## 2014-04-18 LAB — HEPARIN LEVEL (UNFRACTIONATED): Heparin Unfractionated: 0.13 IU/mL — ABNORMAL LOW (ref 0.30–0.70)

## 2014-04-18 LAB — CORTISOL: CORTISOL PLASMA: 41.3 ug/dL

## 2014-04-18 LAB — AMMONIA: Ammonia: 35 umol/L — ABNORMAL HIGH (ref 11–32)

## 2014-04-18 LAB — MAGNESIUM: Magnesium: 1.7 mg/dL (ref 1.5–2.5)

## 2014-04-18 LAB — FIBRINOGEN: FIBRINOGEN: 674 mg/dL — AB (ref 204–475)

## 2014-04-18 LAB — LACTIC ACID, PLASMA: Lactic Acid, Venous: 5.1 mmol/L — ABNORMAL HIGH (ref 0.5–2.2)

## 2014-04-18 MED ORDER — POTASSIUM CHLORIDE 10 MEQ/100ML IV SOLN
10.0000 meq | INTRAVENOUS | Status: AC
  Administered 2014-04-18 (×6): 10 meq via INTRAVENOUS
  Filled 2014-04-18 (×5): qty 100

## 2014-04-18 MED ORDER — MORPHINE SULFATE 2 MG/ML IJ SOLN
2.0000 mg | INTRAMUSCULAR | Status: DC | PRN
  Administered 2014-04-18 – 2014-04-20 (×7): 2 mg via INTRAVENOUS
  Filled 2014-04-18 (×7): qty 1

## 2014-04-18 MED ORDER — HEPARIN BOLUS VIA INFUSION
4000.0000 [IU] | Freq: Once | INTRAVENOUS | Status: DC
  Administered 2014-04-18: 4000 [IU] via INTRAVENOUS
  Filled 2014-04-18: qty 4000

## 2014-04-18 MED ORDER — VANCOMYCIN HCL 10 G IV SOLR: 1500.0000 mg | INTRAVENOUS | Status: DC

## 2014-04-18 MED ORDER — SODIUM CHLORIDE 0.9 % IJ SOLN
3.0000 mL | Freq: Two times a day (BID) | INTRAMUSCULAR | Status: DC
  Administered 2014-04-18 – 2014-04-19 (×3): 3 mL via INTRAVENOUS

## 2014-04-18 MED ORDER — HEPARIN (PORCINE) IN NACL 100-0.45 UNIT/ML-% IJ SOLN
1800.0000 [IU]/h | INTRAMUSCULAR | Status: DC
  Administered 2014-04-18: 1200 [IU]/h via INTRAVENOUS
  Administered 2014-04-18: 1450 [IU]/h via INTRAVENOUS
  Administered 2014-04-18: 1200 [IU]/h via INTRAVENOUS
  Filled 2014-04-18 (×4): qty 250

## 2014-04-18 MED ORDER — POTASSIUM CHLORIDE IN NACL 40-0.9 MEQ/L-% IV SOLN
INTRAVENOUS | Status: DC
  Administered 2014-04-18: 125 mL/h via INTRAVENOUS
  Filled 2014-04-18 (×3): qty 1000

## 2014-04-18 MED ORDER — INSULIN ASPART 100 UNIT/ML ~~LOC~~ SOLN: 0.0000 [IU] | SUBCUTANEOUS | Status: DC

## 2014-04-18 MED ORDER — ACETAMINOPHEN 325 MG PO TABS
650.0000 mg | ORAL_TABLET | Freq: Four times a day (QID) | ORAL | Status: DC | PRN
  Administered 2014-04-24: 650 mg via ORAL
  Filled 2014-04-18: qty 2

## 2014-04-18 MED ORDER — METOPROLOL TARTRATE 1 MG/ML IV SOLN
INTRAVENOUS | Status: AC
  Administered 2014-04-18: 5 mg
  Filled 2014-04-18: qty 5

## 2014-04-18 MED ORDER — POTASSIUM CHLORIDE 10 MEQ/100ML IV SOLN
10.0000 meq | INTRAVENOUS | Status: AC
  Administered 2014-04-18 (×4): 10 meq via INTRAVENOUS
  Filled 2014-04-18 (×4): qty 100

## 2014-04-18 MED ORDER — CHLORHEXIDINE GLUCONATE 0.12 % MT SOLN
15.0000 mL | Freq: Two times a day (BID) | OROMUCOSAL | Status: DC
  Administered 2014-04-18 – 2014-04-25 (×13): 15 mL via OROMUCOSAL
  Filled 2014-04-18 (×15): qty 15

## 2014-04-18 MED ORDER — DIAZEPAM 5 MG/ML IJ SOLN
INTRAMUSCULAR | Status: AC
  Filled 2014-04-18: qty 2

## 2014-04-18 MED ORDER — POLYETHYLENE GLYCOL 3350 17 G PO PACK
17.0000 g | PACK | Freq: Every day | ORAL | Status: DC | PRN
  Filled 2014-04-18: qty 1

## 2014-04-18 MED ORDER — TERBINAFINE HCL 1 % EX CREA
TOPICAL_CREAM | Freq: Two times a day (BID) | CUTANEOUS | Status: DC
  Administered 2014-04-18 (×2): via TOPICAL
  Administered 2014-04-19 (×2): 1 via TOPICAL
  Administered 2014-04-20 – 2014-04-25 (×10): via TOPICAL
  Filled 2014-04-18: qty 12
  Filled 2014-04-18: qty 30

## 2014-04-18 MED ORDER — PIPERACILLIN-TAZOBACTAM 3.375 G IVPB
3.3750 g | Freq: Three times a day (TID) | INTRAVENOUS | Status: DC
  Administered 2014-04-18: 3.375 g via INTRAVENOUS
  Filled 2014-04-18: qty 50

## 2014-04-18 MED ORDER — METOPROLOL TARTRATE 1 MG/ML IV SOLN: 2.5000 mg | Freq: Four times a day (QID) | INTRAVENOUS | Status: DC

## 2014-04-18 MED ORDER — INSULIN ASPART 100 UNIT/ML ~~LOC~~ SOLN: 0.0000 [IU] | Freq: Every day | SUBCUTANEOUS | Status: DC

## 2014-04-18 MED ORDER — POTASSIUM CHLORIDE 10 MEQ/100ML IV SOLN
10.0000 meq | INTRAVENOUS | Status: AC
  Administered 2014-04-18 – 2014-04-19 (×6): 10 meq via INTRAVENOUS
  Filled 2014-04-18 (×4): qty 100

## 2014-04-18 MED ORDER — ONDANSETRON HCL 4 MG/2ML IJ SOLN
4.0000 mg | Freq: Four times a day (QID) | INTRAMUSCULAR | Status: DC | PRN
  Administered 2014-04-24: 4 mg via INTRAVENOUS
  Filled 2014-04-18: qty 2

## 2014-04-18 MED ORDER — LEVOFLOXACIN IN D5W 500 MG/100ML IV SOLN
500.0000 mg | Freq: Once | INTRAVENOUS | Status: AC
  Administered 2014-04-18: 500 mg via INTRAVENOUS
  Filled 2014-04-18: qty 100

## 2014-04-18 MED ORDER — CETYLPYRIDINIUM CHLORIDE 0.05 % MT LIQD
7.0000 mL | Freq: Two times a day (BID) | OROMUCOSAL | Status: DC
  Administered 2014-04-18 – 2014-04-23 (×10): 7 mL via OROMUCOSAL

## 2014-04-18 MED ORDER — LEVOFLOXACIN IN D5W 250 MG/50ML IV SOLN
250.0000 mg | INTRAVENOUS | Status: DC
  Filled 2014-04-18: qty 50

## 2014-04-18 MED ORDER — ACETAMINOPHEN 650 MG RE SUPP: 650.0000 mg | Freq: Four times a day (QID) | RECTAL | Status: DC | PRN

## 2014-04-18 MED ORDER — ONDANSETRON HCL 4 MG PO TABS: 4.0000 mg | ORAL_TABLET | Freq: Four times a day (QID) | ORAL | Status: DC | PRN

## 2014-04-18 MED ORDER — SODIUM CHLORIDE 0.9 % IV BOLUS (SEPSIS): 2500.0000 mL | Freq: Once | INTRAVENOUS | Status: DC

## 2014-04-18 MED ORDER — DIAZEPAM 5 MG/ML IJ SOLN
5.0000 mg | INTRAMUSCULAR | Status: DC | PRN
  Administered 2014-04-18 – 2014-04-22 (×2): 5 mg via INTRAVENOUS
  Filled 2014-04-18: qty 2

## 2014-04-18 MED ORDER — PERFLUTREN LIPID MICROSPHERE
1.0000 mL | Freq: Once | INTRAVENOUS | Status: AC | PRN
  Administered 2014-04-18: 2 mL via INTRAVENOUS
  Filled 2014-04-18: qty 10

## 2014-04-18 MED ORDER — KCL IN DEXTROSE-NACL 40-5-0.45 MEQ/L-%-% IV SOLN
INTRAVENOUS | Status: DC
  Filled 2014-04-18: qty 1000

## 2014-04-18 MED ORDER — INSULIN ASPART 100 UNIT/ML ~~LOC~~ SOLN
0.0000 [IU] | Freq: Three times a day (TID) | SUBCUTANEOUS | Status: DC
  Administered 2014-04-19 (×3): 5 [IU] via SUBCUTANEOUS
  Administered 2014-04-20: 3 [IU] via SUBCUTANEOUS
  Administered 2014-04-20: 5 [IU] via SUBCUTANEOUS
  Administered 2014-04-20 – 2014-04-21 (×3): 3 [IU] via SUBCUTANEOUS
  Administered 2014-04-21: 2 [IU] via SUBCUTANEOUS
  Administered 2014-04-22: 3 [IU] via SUBCUTANEOUS
  Administered 2014-04-22: 2 [IU] via SUBCUTANEOUS
  Administered 2014-04-22 – 2014-04-23 (×3): 3 [IU] via SUBCUTANEOUS
  Administered 2014-04-23: 2 [IU] via SUBCUTANEOUS
  Administered 2014-04-24: 3 [IU] via SUBCUTANEOUS
  Administered 2014-04-24: 2 [IU] via SUBCUTANEOUS
  Administered 2014-04-24: 3 [IU] via SUBCUTANEOUS

## 2014-04-18 MED ORDER — INSULIN GLARGINE 100 UNIT/ML ~~LOC~~ SOLN
10.0000 [IU] | Freq: Every day | SUBCUTANEOUS | Status: DC
  Administered 2014-04-18 – 2014-04-24 (×6): 10 [IU] via SUBCUTANEOUS
  Filled 2014-04-18 (×7): qty 0.1

## 2014-04-18 MED ORDER — INSULIN ASPART 100 UNIT/ML ~~LOC~~ SOLN
4.0000 [IU] | Freq: Three times a day (TID) | SUBCUTANEOUS | Status: DC
  Administered 2014-04-24 – 2014-04-25 (×2): 4 [IU] via SUBCUTANEOUS

## 2014-04-18 MED ORDER — SODIUM CHLORIDE 0.9 % IV SOLN
INTRAVENOUS | Status: DC
  Administered 2014-04-18: 125 mL/h via INTRAVENOUS
  Administered 2014-04-18 – 2014-04-22 (×5): via INTRAVENOUS

## 2014-04-18 MED ORDER — SODIUM CHLORIDE 0.9 % IV BOLUS (SEPSIS)
1000.0000 mL | Freq: Once | INTRAVENOUS | Status: AC
Start: 2014-04-18 — End: 2014-04-18
  Administered 2014-04-18: 1000 mL via INTRAVENOUS

## 2014-04-18 MED ORDER — METOPROLOL TARTRATE 1 MG/ML IV SOLN
5.0000 mg | Freq: Four times a day (QID) | INTRAVENOUS | Status: DC
  Administered 2014-04-18 – 2014-04-21 (×11): 5 mg via INTRAVENOUS
  Filled 2014-04-18 (×9): qty 5

## 2014-04-18 MED ORDER — MAGNESIUM SULFATE 2 GM/50ML IV SOLN
2.0000 g | Freq: Once | INTRAVENOUS | Status: AC
  Administered 2014-04-18: 2 g via INTRAVENOUS
  Filled 2014-04-18: qty 50

## 2014-04-18 MED ORDER — VANCOMYCIN HCL 10 G IV SOLR
1500.0000 mg | Freq: Once | INTRAVENOUS | Status: AC
  Administered 2014-04-18: 1500 mg via INTRAVENOUS
  Filled 2014-04-18: qty 1500

## 2014-04-18 NOTE — Progress Notes (Signed)
90 ml of urine from 7 am to 1800.

## 2014-04-18 NOTE — Progress Notes (Signed)
ANTICOAGULATION CONSULT NOTE - Initial Consult  Pharmacy Consult for IV Heparin Indication: atrial fibrillation  No Known Allergies  Patient Measurements: Height: 5\' 7"  (170.2 cm) Weight: 300 lb (136.079 kg) IBW/kg (Calculated) : 61.6 Heparin Dosing Weight: 84 kg  Vital Signs: Temp: 97.8 F (36.6 C) (12/22 0800) Temp Source: Oral (12/22 0800) BP: 104/77 mmHg (12/22 0900)  Labs:  Recent Labs  04/17/14 2033 04/18/14 0017 04/18/14 0047 04/18/14 0209  04/18/14 0650 04/18/14 0900 04/18/14 1045  HGB 15.2*  --   --  13.3  --   --   --   --   HCT 46.8*  --   --  39.3  --   --   --   --   PLT 485*  --   --  306  --   --   --   --   APTT  --   --  36  --   --   --   --   --   LABPROT 18.2*  --   --   --   --   --   --   --   INR 1.49  --   --   --   --   --   --   --   HEPARINUNFRC  --   --   --   --   --   --   --  <0.10*  CREATININE 1.92*  --  2.58*  --   < > 2.55* 2.55* 2.58*  CKTOTAL 1917*  --   --   --   --   --   --   --   TROPONINI <0.30 0.18*  --   --   --   --  0.27*  --   < > = values in this interval not displayed.  Estimated Creatinine Clearance: 29.7 mL/min (by C-G formula based on Cr of 2.58).   Medical History: Past Medical History  Diagnosis Date  . Thyroid disease     Medications:  Scheduled:  . antiseptic oral rinse  7 mL Mouth Rinse q12n4p  . chlorhexidine  15 mL Mouth Rinse BID  . insulin aspart  0-24 Units Subcutaneous 6 times per day  . [START ON 04/19/2014] levofloxacin (LEVAQUIN) IV  250 mg Intravenous Q24H  . levofloxacin (LEVAQUIN) IV  500 mg Intravenous Once  . potassium chloride  10 mEq Intravenous Q1 Hr x 6  . sodium chloride  3 mL Intravenous Q12H  . terbinafine   Topical BID   Infusions:  . sodium chloride 125 mL/hr at 04/18/14 0946  . 0.9 % NaCl with KCl 40 mEq / L 125 mL/hr at 04/18/14 0700  . dextrose 5 % and 0.45% NaCl Stopped (04/17/14 2300)  . heparin 1,200 Units/hr (04/18/14 0800)  . insulin (NOVOLIN-R) infusion 5.1  Units/hr (04/18/14 0957)    Assessment: 89 yoF admitted s/p weakness/fall.  Found to be in A-fib.  Given UFH bolus of 4000 units, then started at 1200 units/hr. PMH thyroid disease.  Note also treating for sepsis from CAP.   Hgb, plt wnl  Baseline INR, aPTT both on high end of normal  1st heparin level SUBtherapeutic at < 0.1.  Per RN, patient pulled line out at ~0900 today, and lumen noted to be bent.  Unknown if bent during removal or prior  No PTA meds per family  No plans yet for outpatient anticoagulation  Goal of Therapy:  Heparin level 0.3-0.7 units/ml Monitor platelets by anticoagulation protocol:  Yes   Plan:   Given RN report, will continue heparin as ordered and re-check a level in 6 hrs today  Daily heparin level once stable  Daily CBC  F/u for signs of bleeding or other complications  F/u transition to PO anticoag if necessary  Bernadene Personrew Fanny Agan, PharmD Pager: [phone] 04/18/2014, 11:58 AM

## 2014-04-18 NOTE — Progress Notes (Signed)
HR 110-140, Anuria, Odd posture/positioning.  Notified Dr. Darnelle Catalanama.  Dr. Darnelle Catalanama wanted verified with charge nurse.  Orders received.

## 2014-04-18 NOTE — Progress Notes (Signed)
Utilization review completed.  

## 2014-04-18 NOTE — ED Notes (Signed)
Dr. Konrad DoloresMerrell called and made aware of Potassium at 2.4 and reported currently putting in orders and it is OK for pt to be transferred to ICU.

## 2014-04-18 NOTE — Progress Notes (Signed)
Echocardiogram 2D Echocardiogram with Definity has been performed.  [name] 04/18/2014, 1:49 PM

## 2014-04-18 NOTE — Progress Notes (Signed)
ANTIBIOTIC CONSULT NOTE - INITIAL  Pharmacy Consult for Zosyn and Vancomycin >> Levofloxacin Indication: Sepsis d/t CAP  No Known Allergies  Patient Measurements: Height: 5\' 7"  (170.2 cm) Weight: 300 lb (136.079 kg) IBW/kg (Calculated) : 61.6   Vital Signs: Temp: 99.3 F (37.4 C) (12/22 0339) Temp Source: Axillary (12/22 0339) BP: 104/77 mmHg (12/22 0900) Pulse Rate: 105 (12/21 2330) Intake/Output from previous day: 12/21 0701 - 12/22 0700 In: 1702.4 [I.V.:652.4; IV Piggyback:1050] Out: 30 [Urine:30] Intake/Output from this shift: Total I/O In: 112 [I.V.:12; IV Piggyback:100] Out: 10 [Urine:10]  Labs:  Recent Labs  04/17/14 2033  04/18/14 0209 04/18/14 0515 04/18/14 0650 04/18/14 0900  WBC 29.2*  --  23.5*  --   --   --   HGB 15.2*  --  13.3  --   --   --   PLT 485*  --  306  --   --   --   CREATININE 1.92*  < >  --  2.45* 2.55* 2.55*  < > = values in this interval not displayed. Estimated Creatinine Clearance: 30 mL/min (by C-G formula based on Cr of 2.55). No results for input(s): VANCOTROUGH, VANCOPEAK, VANCORANDOM, GENTTROUGH, GENTPEAK, GENTRANDOM, TOBRATROUGH, TOBRAPEAK, TOBRARND, AMIKACINPEAK, AMIKACINTROU, AMIKACIN in the last 72 hours.   Microbiology: Recent Results (from the past 720 hour(s))  MRSA PCR Screening     Status: None   Collection Time: 04/18/14  3:55 AM  Result Value Ref Range Status   MRSA by PCR NEGATIVE NEGATIVE Final    Comment:        The GeneXpert MRSA Assay (FDA approved for NASAL specimens only), is one component of a comprehensive MRSA colonization surveillance program. It is not intended to diagnose MRSA infection nor to guide or monitor treatment for MRSA infections.     Medical History: Past Medical History  Diagnosis Date  . Thyroid disease     Medications:  Scheduled:  . antiseptic oral rinse  7 mL Mouth Rinse q12n4p  . chlorhexidine  15 mL Mouth Rinse BID  . insulin aspart  0-24 Units Subcutaneous 6 times  per day  . potassium chloride  10 mEq Intravenous Q1 Hr x 6  . sodium chloride  3 mL Intravenous Q12H  . terbinafine   Topical BID   Infusions:  . sodium chloride 125 mL/hr at 04/18/14 0946  . 0.9 % NaCl with KCl 40 mEq / L 125 mL/hr at 04/18/14 0700  . dextrose 5 % and 0.45% NaCl Stopped (04/17/14 2300)  . heparin 1,200 Units/hr (04/18/14 0800)  . insulin (NOVOLIN-R) infusion 5.1 Units/hr (04/18/14 0957)   Assessment: [age] yoF c/o weakness/fall at home.  CBG=953 on admission.  Started on Zosyn and Vancomycin per Rx for Sepsis.  CT shows RLL pna, CXR w/o significant infiltrates.  UA not c/w UTI.  MD would like to narrow antibiotics to levofloxacin.  12/21 >> zosyn >> 12/22 12/21 >> vancomycin >> 12/22 12/22 >> levofloxacin >>   Tmax: Afebrile WBCs: Elevated, trending down Renal: AKI - SCr increasing after admission, now stable around 2.55; CrCl 30 CG, 23 Norm QTc 424 ms  12/21 blood: IP 12/21 urine: IP 12/21 sputum:  12/21 MRSA swab: negative   Goal of Therapy:  Eradication of infection Appropriate dosing of antibiotics for renal function and indication   Plan:   Levofloxacin 500 mg IV now  Start levofloxacin 250 mg IV q24 tomorrow at 1200  F/u Scr/levels/cultures as needed  F/u LOT, opportunity for IV-PO conversion  [employer]  Sonora Catlin, PharmD Pager: [phone] 04/18/2014, 11:28 AM

## 2014-04-18 NOTE — Progress Notes (Signed)
ANTICOAGULATION CONSULT NOTE - Initial Consult  Pharmacy Consult for IV Heparin Indication: atrial fibrillation  No Known Allergies  Patient Measurements: Height: 5\' 7"  (170.2 cm) Weight: 300 lb (136.079 kg) IBW/kg (Calculated) : 61.6 Heparin Dosing Weight: 84 kg  Vital Signs: Temp: 97.8 F (36.6 C) (12/21 1938) Temp Source: Oral (12/21 1938) BP: 124/69 mmHg (12/21 2330) Pulse Rate: 105 (12/21 2330)  Labs:  Recent Labs  04/17/14 2033  HGB 15.2*  HCT 46.8*  PLT 485*  LABPROT 18.2*  INR 1.49  CREATININE 1.92*  CKTOTAL 1917*  TROPONINI <0.30    Estimated Creatinine Clearance: 39.9 mL/min (by C-G formula based on Cr of 1.92).   Medical History: Past Medical History  Diagnosis Date  . Thyroid disease     Medications:  Scheduled:  . heparin  4,000 Units Intravenous Once  . insulin aspart  0-24 Units Subcutaneous 6 times per day  . magnesium sulfate 1 - 4 g bolus IVPB  2 g Intravenous Once  . piperacillin-tazobactam (ZOSYN)  IV  3.375 g Intravenous 3 times per day  . potassium chloride  10 mEq Intravenous Q1 Hr x 6  . sodium chloride  2,500 mL Intravenous Once  . sodium chloride  3 mL Intravenous Q12H  . vancomycin  1,500 mg Intravenous Once  . vancomycin  1,500 mg Intravenous Q24H   Infusions:  . dextrose 5 % and 0.45 % NaCl with KCl 40 mEq/L    . dextrose 5 % and 0.45% NaCl    . heparin    . insulin (NOVOLIN-R) infusion 5.4 Units/hr (04/17/14 2349)    Assessment: [age] yoF admitted s/p weakness/fall.  Found to be in A-fib.  Per husband pt takes no medications at home. IV heparin per Rx for A-fib.  Goal of Therapy:  Heparin level 0.3-0.7 units/ml Monitor platelets by anticoagulation protocol: Yes   Plan:   Baseline aPtt now  Heparin 4000 unit bolus x1  Start drip @ 1200 units/hr  Daily CBC/HL  Check 1st HL in 8 hours  Susanne GreenhouseGreen, Christopher Hink R 04/18/2014,1:32 AM

## 2014-04-18 NOTE — Progress Notes (Signed)
CRITICAL VALUE ALERT  Critical value received:  822  Date of notification:  04/18/14  Time of notification:  0309  Critical value read back: yes  Nurse who received alert: T.Naara Kelty, RN  MD notified (1st page):  K.Schorr, NP  Time of first page:  0310

## 2014-04-18 NOTE — ED Notes (Signed)
Awake. Verbally responsive. A/O x4. Resp even and unlabored. No audible adventitious breath sounds noted. ABC's intact. A.Fib on monitor. Family at bedside. Insulin drip infusing at 5.454ml/hr without difficulty. No adverse reaction noted from IV ABT's. NS infusing at 3050ml/hr.

## 2014-04-18 NOTE — Progress Notes (Signed)
CRITICAL VALUE ALERT  Critical value received: Potassium 2.2  Date of notification:  04/18/14  Time of notification:  0625  Critical value read back:Yes.    Nurse who received alert:  T.Lenus Trauger,RN  MD notified (1st page):  Merdis DelayK. Schorr, NP  Time of first page:  [phone]

## 2014-04-18 NOTE — Progress Notes (Signed)
ANTIBIOTIC CONSULT NOTE - INITIAL  Pharmacy Consult for Zosyn and Vancomycin Indication: Sepsis  No Known Allergies  Patient Measurements: Height: 5\' 7"  (170.2 cm) Weight: 300 lb (136.079 kg) IBW/kg (Calculated) : 61.6   Vital Signs: Temp: 97.8 F (36.6 C) (12/21 1938) Temp Source: Oral (12/21 1938) BP: 124/69 mmHg (12/21 2330) Pulse Rate: 105 (12/21 2330) Intake/Output from previous day:   Intake/Output from this shift:    Labs:  Recent Labs  04/17/14 2033  WBC 29.2*  HGB 15.2*  PLT 485*  CREATININE 1.92*   Estimated Creatinine Clearance: 39.9 mL/min (by C-G formula based on Cr of 1.92). No results for input(s): VANCOTROUGH, VANCOPEAK, VANCORANDOM, GENTTROUGH, GENTPEAK, GENTRANDOM, TOBRATROUGH, TOBRAPEAK, TOBRARND, AMIKACINPEAK, AMIKACINTROU, AMIKACIN in the last 72 hours.   Microbiology: No results found for this or any previous visit (from the past 720 hour(s)).  Medical History: Past Medical History  Diagnosis Date  . Thyroid disease     Medications:  Scheduled:  . heparin  4,000 Units Intravenous Once  . insulin aspart  0-24 Units Subcutaneous 6 times per day  . magnesium sulfate 1 - 4 g bolus IVPB  2 g Intravenous Once  . piperacillin-tazobactam (ZOSYN)  IV  3.375 g Intravenous 3 times per day  . potassium chloride  10 mEq Intravenous Q1 Hr x 6  . sodium chloride  2,500 mL Intravenous Once  . sodium chloride  3 mL Intravenous Q12H  . vancomycin  1,500 mg Intravenous Once  . vancomycin  1,500 mg Intravenous Q24H   Infusions:  . dextrose 5 % and 0.45 % NaCl with KCl 40 mEq/L    . dextrose 5 % and 0.45% NaCl    . heparin    . insulin (NOVOLIN-R) infusion 5.4 Units/hr (04/17/14 2349)   Assessment: 68 yoF c/o weakness/fall at home.  Pt with glucose = 953. IV heparin for A-fib ordered and Zosyn and Vancomycin per Rx for Sepsis.   Goal of Therapy:  Vancomycin trough level 15-20 mcg/ml  Plan:   Zosyn 3.375 Gm IV q8h EI infusion  Vancomycin 1Gm  x1 Ed then 1500mg  = 2500mg  load then 1500 mg IV q24h   F/u Scr/levels/cultures as needed  Susanne GreenhouseGreen, Allister Lessley R 04/18/2014,1:24 AM

## 2014-04-18 NOTE — Progress Notes (Signed)
Brief Pharmacy note:  In brief, [age] yoF on IV heparin for new-onset AF.  Heparin level this morning subtherapeutic however pt pulled line out and needle was bent.  Therefore infusion continued at current rate and repeat heparin level ordered which is again subtherapeutic. No issues per RN and no s/sxs bleeding.  CBC this AM WNL.   Heparin dosing weight = 84kg  Plan: Increase heparin infusion to 1450 units/hr = 14.525ml/hr.  F/u heparin level in 8 hours.    Haynes Hoehnolleen Eleanore Junio, PharmD, BCPS 04/18/2014, 7:18 PM  Pager: [phone]

## 2014-04-18 NOTE — Progress Notes (Signed)
CRITICAL VALUE ALERT  Critical value received: Potassium 2.1  Date of notification:  04/18/14  Time of notification:  0309  Critical value read back:Yes.    Nurse who received alert:  Odis Hollingshead.Takeria Marquina, RN  MD notified (1st page):  Merdis DelayK. Schorr, NP  Time of first page:  [phone]

## 2014-04-18 NOTE — Progress Notes (Signed)
Progress Note   [name]:[id number] DOB: 1945/09/17 DOA: 04/17/2014 PCP: No primary care provider on file.   Brief Narrative:   [name] is an 68 y.o. female with a PMH of thyroid disease, not on home medications, who was admitted 04/17/14 after becoming weak and falling at home. There was no loss of consciousness. Family reports generalized failure to thrive over the past 3-4 years. Upon initial evaluation in the ED, the patient was found to have a potassium of 2.4, a glucose of 953, a creatinine of 1.92, WBC of 29.2, CK 1917, proBNP 7693.0 and a lactic acid of 8.74. She was admitted for evaluation and treatment of sepsis, HONK, and rhabdomyolysis.  Assessment/Plan:   Principal Problem:   Sepsis secondary to community-acquired pneumonia with lactic acidosis and leukocytosis  Source identified as a right lower lobe pneumonia on CT. Chest x-ray without significant infiltrates. Urinalysis negative for nitrites and leukocytes.  Blood cultures, urine culture pending.  Currently on empiric Zosyn and vancomycin. We'll narrow antibiotics to Levaquin.  Follow-up serum cortisol.  Active Problems:   Hyperosmolar non-ketotic state in patient with type 2 diabetes mellitus / high anion gap metabolic acidosis  Anion gap 25 on admission, 16 on most recent chemistries.  CBG this a.m. 290.  Continue insulin drip.    Continue every 2 hour chemistry checks.  Continue aggressive IV fluids.    Elevated brain natriuretic peptide (BNP) level / elevated troponin  2-D echocardiogram ordered.  Troponin elevation likely from demand ischemia. Cycle troponins every 6 hours 3 sets.  Currently on a heparin drip.     Yeast dermatitis  Lamisil cream ordered.    History of thyroid disease  Follow-up TSH.     Atrial fibrillation with RVR  Patient in atrial fibrillation with rapid ventricular response, heart rate 143 on EKG done on admission.  Heart rate currently  108-120.  Currently on a heparin drip.    Hypokalemia  Magnesium level okay. Replete. 6 runs of IV potassium ordered for this morning.    Rhabdomyolysis  Continue aggressive IV fluids. Follow-up CK levels in a.m.    AKI (acute kidney injury)  Likely secondary to sepsis and rhabdomyolysis.  Hydrating.    Altered mental status / metabolic encephalopathy  Correct metabolic derangements and monitor.    DVT Prophylaxis  Currently on a heparin drip.  Code Status: Full. Family Communication: No family at the bedside. Disposition Plan: Home vs SNF when stable.   IV Access:    Peripheral IV   Procedures and diagnostic studies:   Ct Abdomen Pelvis Wo Contrast 04/18/2014: 1. Right lower lobe pneumonia noted; this likely explains the patient's symptoms. 2. Cholelithiasis; gallbladder otherwise unremarkable. 3. Small to moderate umbilical hernia, containing only fat. 4. Mildly enlarged fibroid uterus noted.   Electronically Signed   By: Roanna Raider M.D.   On: 04/18/2014 05:07   Dg Chest 2 View 04/17/2014: Elevated right hemidiaphragm with associated right basilar atelectasis. No consolidation or significant edema.   Electronically Signed   By: Roxy Horseman M.D.   On: 04/17/2014 21:36   Dg Lumbar Spine 2-3 Views 04/17/2014: 1. No definite acute traumatic injury within the lumbar spine. 2. Anterior wedging of the L1 and L2 vertebral bodies, favored to be chronic in nature. Possible acute compression fractures are not entirely excluded. Correlation with site of pain recommended.     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 No fracture or dislocation.   Electronically Signed   By: Bretta BangWilliam  Woodruff M.D.   On: 04/17/2014 21:37   Dg Tibia/fibula Left 04/17/2014: No evidence of fracture or dislocation.       Medical Consultants:    None.  Anti-Infectives:    Vancomycin 04/17/14--->  04/18/14  Zosyn 04/17/14---> 04/18/14  Levaquin 04/18/14--->  Subjective:   [name] denies chest pain, dyspnea.  Feels "sleepy" and "tired".  Has not seen a doctor in many years, and does not have a PCP.    Objective:    Filed Vitals:   04/18/14 0400 04/18/14 0500 04/18/14 0700 04/18/14 0800  BP: 99/49 91/37 109/42 99/40  Pulse:      Temp:      TempSrc:      Resp: 19 24 29 26   Height:      Weight:      SpO2: 94% 96% 96% 96%    Intake/Output Summary (Last 24 hours) at 04/18/14 0838 Last data filed at 04/18/14 0800  Gross per 24 hour  Intake 1814.35 ml  Output     40 ml  Net 1774.35 ml    Exam: Gen:  NAD, weak. Cardiovascular:  HSIR/tachy, No M/R/G Respiratory:  Lungs diminished Gastrointestinal:  Abdomen soft, NT/ND, + BS Skin: Yeast infection/erythema groin folds Extremities:  No C/E/C   Data Reviewed:    Labs: Basic Metabolic Panel:  Recent Labs Lab 04/17/14 2033 04/18/14 0047 04/18/14 0515  NA 135* 138 144  K 2.4* 2.1* 2.2*  CL 85* 98 105  CO2 25 27 23   GLUCOSE 953* 822* 353*  BUN 17 21 21   CREATININE 1.92* 2.58* 2.45*  CALCIUM 9.4 8.0* 8.0*  MG  --  1.7  --    GFR Estimated Creatinine Clearance: 31.3 mL/min (by C-G formula based on Cr of 2.45). Liver Function Tests:  Recent Labs Lab 04/17/14 2033  AST 44*  ALT 17  ALKPHOS 155*  BILITOT 1.1  PROT 7.5  ALBUMIN 2.6*    Recent Labs Lab 04/18/14 0047  LIPASE 29    Recent Labs Lab 04/18/14 0049  AMMONIA 35*   Coagulation profile  Recent Labs Lab 04/17/14 2033  INR 1.49    CBC:  Recent Labs Lab 04/17/14 2033 04/18/14 0209  WBC 29.2* 23.5*  NEUTROABS 26.8*  --   HGB 15.2* 13.3  HCT 46.8* 39.3  MCV 92.9 89.1  PLT 485* 306   Cardiac Enzymes:  Recent Labs Lab 04/17/14 2033 04/18/14 0017  CKTOTAL 1917*  --   TROPONINI <0.30 0.18*   BNP (last 3 results)  Recent Labs  04/17/14 2033  PROBNP 7693.0*   CBG:  Recent Labs Lab 04/18/14 0251  04/18/14 0356 04/18/14 0459 04/18/14 0605 04/18/14 0655  GLUCAP >600* 426* 347* 317* 295*   Sepsis Labs:  Recent Labs Lab 04/17/14 2015 04/17/14 2033 04/18/14 0207 04/18/14 0209  WBC  --  29.2*  --  23.5*  LATICACIDVEN 8.74*  --  5.1*  --    Microbiology Recent Results (from the past 240 hour(s))  MRSA PCR Screening     Status: None   Collection Time: 04/18/14  3:55 AM  Result Value Ref Range Status   MRSA by PCR NEGATIVE NEGATIVE Final    Comment:        The GeneXpert MRSA Assay (FDA approved for NASAL specimens only), is one component of a comprehensive MRSA colonization surveillance program. It is not intended to diagnose MRSA  infection nor to guide or monitor treatment for MRSA infections.      Medications:   . antiseptic oral rinse  7 mL Mouth Rinse q12n4p  . chlorhexidine  15 mL Mouth Rinse BID  . insulin aspart  0-24 Units Subcutaneous 6 times per day  . potassium chloride  10 mEq Intravenous Q1 Hr x 6  . potassium chloride  10 mEq Intravenous Q1 Hr x 6  . sodium chloride  3 mL Intravenous Q12H  . terbinafine   Topical BID   Continuous Infusions: . 0.9 % NaCl with KCl 40 mEq / L 125 mL/hr at 04/18/14 0700  . dextrose 5 % and 0.45% NaCl Stopped (04/17/14 2300)  . heparin 1,200 Units/hr (04/18/14 0800)  . insulin (NOVOLIN-R) infusion 3.9 mL/hr at 04/18/14 0800    Time spent: 35 minutes.  The patient is medically complex and requires high complexity decision making.    LOS: 1 day   Toron Bowring  [facility] Pager [phone]. If unable to reach me by pager, please call my cell phone at [phone].  *Please refer to [email or web address], password [email or web address] to get updated schedule on who will round on this patient, as hospitalists switch teams weekly. If 7PM-7AM, please contact night-coverage at [email or web address], password [email or web address] for any overnight needs.  04/18/2014, 8:38 AM

## 2014-04-19 LAB — BASIC METABOLIC PANEL
ANION GAP: 13 (ref 5–15)
ANION GAP: 9 (ref 5–15)
BUN: 21 mg/dL (ref 6–23)
BUN: 27 mg/dL — ABNORMAL HIGH (ref 6–23)
CALCIUM: 7.3 mg/dL — AB (ref 8.4–10.5)
CHLORIDE: 104 meq/L (ref 96–112)
CO2: 27 mmol/L (ref 19–32)
CO2: 22 mmol/L (ref 19–32)
CREATININE: 2.55 mg/dL — AB (ref 0.50–1.10)
Calcium: 7.9 mg/dL — ABNORMAL LOW (ref 8.4–10.5)
Chloride: 111 mEq/L (ref 96–112)
Creatinine, Ser: 2.67 mg/dL — ABNORMAL HIGH (ref 0.50–1.10)
GFR calc non Af Amer: 18 mL/min — ABNORMAL LOW (ref >90)
GFR, EST AFRICAN AMERICAN: 21 mL/min — AB (ref >90)
GFR, EST AFRICAN AMERICAN: 20 mL/min — AB (ref >90)
GFR, EST NON AFRICAN AMERICAN: 17 mL/min — AB (ref >90)
Glucose, Bld: 188 mg/dL — ABNORMAL HIGH (ref 70–99)
Glucose, Bld: 198 mg/dL — ABNORMAL HIGH (ref 70–99)
POTASSIUM: 2.7 mmol/L — AB (ref 3.5–5.1)
Potassium: 3.9 mmol/L (ref 3.5–5.1)
SODIUM: 144 mmol/L (ref 135–145)
Sodium: 142 mmol/L (ref 135–145)

## 2014-04-19 LAB — URINE CULTURE
COLONY COUNT: NO GROWTH
Culture: NO GROWTH
Special Requests: NORMAL

## 2014-04-19 LAB — GLUCOSE, CAPILLARY
GLUCOSE-CAPILLARY: 153 mg/dL — AB (ref 70–99)
GLUCOSE-CAPILLARY: 232 mg/dL — AB (ref 70–99)
GLUCOSE-CAPILLARY: 206 mg/dL — AB (ref 70–99)
Glucose-Capillary: 185 mg/dL — ABNORMAL HIGH (ref 70–99)
Glucose-Capillary: 227 mg/dL — ABNORMAL HIGH (ref 70–99)

## 2014-04-19 LAB — CBC
HCT: 34.8 % — ABNORMAL LOW (ref 36.0–46.0)
Hemoglobin: 11.8 g/dL — ABNORMAL LOW (ref 12.0–15.0)
MCH: 30 pg (ref 26.0–34.0)
MCHC: 33.9 g/dL (ref 30.0–36.0)
MCV: 88.5 fL (ref 78.0–100.0)
PLATELETS: 246 10*3/uL (ref 150–400)
RBC: 3.93 MIL/uL (ref 3.87–5.11)
RDW: 14 % (ref 11.5–15.5)
WBC: 21 10*3/uL — ABNORMAL HIGH (ref 4.0–10.5)

## 2014-04-19 LAB — HEPARIN LEVEL (UNFRACTIONATED): Heparin Unfractionated: 0.22 IU/mL — ABNORMAL LOW (ref 0.30–0.70)

## 2014-04-19 LAB — CK: Total CK: 1760 U/L — ABNORMAL HIGH (ref 7–177)

## 2014-04-19 LAB — MAGNESIUM: MAGNESIUM: 1.8 mg/dL (ref 1.5–2.5)

## 2014-04-19 MED ORDER — LEVOFLOXACIN IN D5W 750 MG/150ML IV SOLN
750.0000 mg | INTRAVENOUS | Status: DC
  Administered 2014-04-20 – 2014-04-23 (×3): 750 mg via INTRAVENOUS
  Filled 2014-04-19 (×4): qty 150

## 2014-04-19 MED ORDER — POTASSIUM CHLORIDE 10 MEQ/100ML IV SOLN
INTRAVENOUS | Status: AC
  Administered 2014-04-19: 10 meq via INTRAVENOUS
  Filled 2014-04-19: qty 100

## 2014-04-19 MED ORDER — HEPARIN (PORCINE) IN NACL 100-0.45 UNIT/ML-% IJ SOLN
2300.0000 [IU]/h | INTRAMUSCULAR | Status: DC
  Administered 2014-04-20: 1950 [IU]/h via INTRAVENOUS
  Filled 2014-04-19 (×4): qty 250

## 2014-04-19 NOTE — Progress Notes (Signed)
PHARMACY - HEPARIN (brief note)  IV heparin infusing @ 1800 units/hr Heparin level = 0.22 (subtherapeutic as goal = 0.3-0.7) No complications of therapy noted  Heparin dosing weight = 84 kg  Plan:  Increase heparin to 1950 units/hr           Check heparin level 8 hr after rate increased  Terrilee FilesLeann Aslee Such, PharmD 04/19/14 @ 16:12

## 2014-04-19 NOTE — Progress Notes (Signed)
12 Lead EKG showed atrial tachycardia with frequent PACs and PVCs. Rhythm continues to be irregular but does appear to be in sinus. Dr. Elisabeth Pigeonevine paged with findings.

## 2014-04-19 NOTE — Progress Notes (Signed)
ANTIBIOTIC CONSULT NOTE - Follow-up  Pharmacy Consult for Zosyn and Vancomycin >> Levofloxacin Indication: Sepsis d/t CAP  No Known Allergies  Patient Measurements: Height: 5\' 7"  (170.2 cm) Weight: 300 lb (136.079 kg) IBW/kg (Calculated) : 61.6   Vital Signs: Temp: 97.8 F (36.6 C) (12/23 0400) Temp Source: Oral (12/23 0400) BP: 92/58 mmHg (12/23 0600) Intake/Output from previous day: 12/22 0701 - 12/23 0700 In: 5870 [P.O.:580; I.V.:4190; IV Piggyback:1100] Out: 375 [Urine:375] Intake/Output from this shift:    Labs:  Recent Labs  04/17/14 2033  04/18/14 0209  04/18/14 1319 04/18/14 1500 04/19/14 0359  WBC 29.2*  --  23.5*  --   --   --  21.0*  HGB 15.2*  --  13.3  --   --   --  11.8*  PLT 485*  --  306  --   --   --  246  CREATININE 1.92*  < >  --   < > 2.73* 2.64* 2.67*  < > = values in this interval not displayed. Estimated Creatinine Clearance: 28.7 mL/min (by C-G formula based on Cr of 2.67). No results for input(s): VANCOTROUGH, VANCOPEAK, VANCORANDOM, GENTTROUGH, GENTPEAK, GENTRANDOM, TOBRATROUGH, TOBRAPEAK, TOBRARND, AMIKACINPEAK, AMIKACINTROU, AMIKACIN in the last 72 hours.   Microbiology: Recent Results (from the past 720 hour(s))  Urine culture     Status: None   Collection Time: 04/17/14  8:47 PM  Result Value Ref Range Status   Specimen Description URINE, CATHETERIZED  Final   Special Requests Normal  Final   Culture  Setup Time   Final    04/18/2014 04:23 Performed at [facility]    Colony Count NO GROWTH Performed at [facility]   Final   Culture NO GROWTH Performed at [facility]   Final   Report Status 04/19/2014 FINAL  Final  MRSA PCR Screening     Status: None   Collection Time: 04/18/14  3:55 AM  Result Value Ref Range Status   MRSA by PCR NEGATIVE NEGATIVE Final    Comment:        The GeneXpert MRSA Assay (FDA approved for NASAL specimens only), is one component of a comprehensive MRSA  colonization surveillance program. It is not intended to diagnose MRSA infection nor to guide or monitor treatment for MRSA infections.     Medical History: Past Medical History  Diagnosis Date  . Thyroid disease     Medications:  Scheduled:  . antiseptic oral rinse  7 mL Mouth Rinse q12n4p  . chlorhexidine  15 mL Mouth Rinse BID  . insulin aspart  0-15 Units Subcutaneous TID WC  . insulin aspart  0-5 Units Subcutaneous QHS  . insulin aspart  4 Units Subcutaneous TID WC  . insulin glargine  10 Units Subcutaneous QHS  . levofloxacin (LEVAQUIN) IV  250 mg Intravenous Q24H  . metoprolol  5 mg Intravenous 4 times per day  . sodium chloride  3 mL Intravenous Q12H  . terbinafine   Topical BID   Infusions:  . sodium chloride 125 mL/hr at 04/19/14 0700  . dextrose 5 % and 0.45% NaCl 50 mL/hr at 04/19/14 0700  . heparin 1,450 Units/hr (04/19/14 0600)   Assessment: 68 yoF c/o weakness/fall at home.  CBG=953 on admission.  Started on Zosyn and Vancomycin per Rx for Sepsis.  CT shows RLL pna, CXR w/o significant infiltrates.  UA not c/w UTI.  MD would like to narrow antibiotics to levofloxacin.  12/21 >> zosyn >> 12/22 12/21 >>  vancomycin >> 12/22 12/22 >> levofloxacin >>   Tmax: Afebrile on admission, spiked fever to 100.6 overnight WBCs: Elevated, trending down Renal: AKI - SCr increasing after admission, now 2.67; CrCl 39 CG, 22 Norm QTc 424 ms  12/21 blood: IP 12/21 urine: NGF 12/21 sputum:  12/21 MRSA swab: negative  Drug level / dose changes info: 12/21: D0 Zosyn 3.375 gm IV q8h EI and Vancomycin 1000 + 1500 mg x 1, then 1500 mg IV q24h for Sepsis.  12/22: switched to levofloxacin 500 mg IV x 1, then 250 mg IV q24 12/23: switched to levofloxacin 750 mg IV q48  Goal of Therapy:  Eradication of infection Appropriate dosing of antibiotics for renal function and indication   Plan:   Started on levofloxacin dosing based on 500 mg daily; given overnight fevers and  initial severity of presentation with sepsis, will switch to regimen based on 750 mg daily.  Last dose 500 mg yesterday at 1200.  Start levofloxacin 750 mg IV q48 at 2200 tonight  F/u clinical course, SCr, cultures as needed  F/u LOT, opportunity for IV-PO conversion  Natalie Personrew Shelley Cocke, PharmD Pager: [phone] 04/19/2014, 8:16 AM

## 2014-04-19 NOTE — Progress Notes (Signed)
Inpatient Diabetes Program Recommendations  AACE/ADA: New Consensus Statement on Inpatient Glycemic Control (2013)  Target Ranges:  Prepandial:   less than 140 mg/dL      Peak postprandial:   less than 180 mg/dL (1-2 hours)      Critically ill patients:  140 - 180 mg/dL   Reason for Visit: Diabetes Consult  Diabetes history: DM2 Outpatient Diabetes medications: None Current orders for Inpatient glycemic control: Lantus 10 units QHS, Novolog moderate tidwc and hs + 4 units tidwc  68 y.o. female with a PMH of thyroid disease, not on home medications, who was admitted 04/17/14 after becoming weak and falling at home. Admitted for evaluation and treatment of sepsis, HONK, and rhabdomyolysis. Hospital course is complicated with development of atrial fibrillation with rapid ventricular rate, cardiology has seen the patient in consultation. AG is 9. GlucoStabilizer discontinued and Lantus given.     Inpatient Diabetes Program Recommendations Insulin - Basal: Increase Lantus to 20 units QHS Insulin - Meal Coverage: Novolog 4 units tidwc - titrate if CBGs > 180 mg/dL [id number]: Check [id number] to assess glycemic control prior to hospitalization Outpatient Referral: Will order OP Diabetes Education consult   Note: RN states not appropriate for writer to discuss diabetes diagnosis, etc at this point. Will check back in late afternoon.   Will continue to follow. Thank you. Ailene Ardshonda Tonnie Friedel, RD, LDN, CDE Inpatient Diabetes Coordinator [phone]

## 2014-04-19 NOTE — Progress Notes (Addendum)
Patient ID: LUMA CLOPPER, female   DOB: 09-07-1945, 68 y.o.   MRN: [id number] [facility] PROGRESS NOTE  MELINNA LINAREZ WUJ:[id number] DOB: 10-22-1945 DOA: 04/17/2014 PCP: No primary care provider on file.  Brief narrative:    68 y.o. female with a PMH of thyroid disease, not on home medications, who was admitted 04/17/14 after becoming weak and falling at home. There was no loss of consciousness. Family reports generalized failure to thrive over the past 3-4 years. Upon initial evaluation in the ED, the patient was found to have a potassium of 2.4, a glucose of 953, a creatinine of 1.92, WBC of 29.2, CK 1917, proBNP 7693.0 and a lactic acid of 8.74. She was admitted for evaluation and treatment of sepsis, HONK, and rhabdomyolysis. Hospital course is complicated with development of atrial fibrillation with rapid ventricular rate, cardiology has seen the patient in consultation.  Assessment/Plan:     Principal Problem:  Sepsis secondary to community-acquired pneumonia with lactic acidosis and leukocytosis  Based on CT scan, right lower lobe pneumonia. Chest x-ray did not show significant infiltrates. Urinalysis did not reveal UTI.  Blood cultures are pending at this time. Urine culture shows no growth.  Patient was initially on empiric antibiotics, Zosyn and vancomycin but this was narrowed down to Levaquin.  Leukocytosis is improving.   Active Problems:   New atrial fibrillation  Appreciate cardiology consult and recommendations. Patient is on heparin drip. She may require Cardizem drip if heart rate does not improve.  Because of atrial fibrillation, possibility that patient may require Cardizem drip, hypotension we will keep the patient in ICU for next 24 hours.   Hyperosmolar non-ketotic state in patient with type 2 diabetes mellitus / high anion gap metabolic acidosis  Anion gap 25 on admission. Based on BMP this morning, anion gap is within normal limits. CO2 is 27.  Current  insulin regimen includes Lantus 10 units at bedtime along with NovoLog 4 units 3 times daily with meals and sliding scale insulin.  Would use low rate IV fluids because of elevated BNP level.   Elevated brain natriuretic peptide (BNP) level / elevated troponin  2-D echocardiogram shows preserved ejection fraction.  Troponin elevation likely from demand ischemia. Cycle troponins. Last troponin level is 0.21.  Currently on a heparin drip.  Appreciate cardiology following.   Yeast dermatitis  Lamisil cream ordered.   History of thyroid disease  TSH is within normal limits.    Hypokalemia  Magnesium level within normal limits. Potassium supplemented.   Rhabdomyolysis  Continue low rate IV fluids because of high BNP level.  Follow-up CK level in the morning.   AKI (acute kidney injury)  Likely secondary to sepsis and rhabdomyolysis versus possible chronic kidney disease but we do not have previous values for comparison.  Creatinine is 2.67 this morning. Follow-up BMP in the morning.   Altered mental status / metabolic encephalopathy  Patient is alert and oriented to self, place, time. She does seem to be intermittently confused. I spoke with the patient about getting cardiology consult for atrial fibrillation but she responded "no please don't I just need to rest".   DVT Prophylaxis  Currently on a heparin drip.  Code Status: Full. Family Communication: No family at the bedside. Disposition Plan: remains in SDU   IV access:   Peripheral IV  Procedures and diagnostic studies:     Ct Abdomen Pelvis Wo Contrast 04/18/2014: 1. Right lower lobe pneumonia noted; this likely explains the patient's symptoms. 2. Cholelithiasis;  gallbladder otherwise unremarkable. 3. Small to moderate umbilical hernia, containing only fat. 4. Mildly enlarged fibroid uterus noted. Electronically Signed By: Roanna RaiderJeffery Chang M.D. On: 04/18/2014 05:07   Dg Chest 2 View  04/17/2014: Elevated right hemidiaphragm with associated right basilar atelectasis. No consolidation or significant edema. Electronically Signed By: Roxy HorsemanBill Veazey M.D. On: 04/17/2014 21:36   Dg Lumbar Spine 2-3 Views 04/17/2014: 1. No definite acute traumatic injury within the lumbar spine. 2. Anterior wedging of the L1 and L2 vertebral bodies, favored to be chronic in nature. Possible acute compression fractures are not entirely excluded. Correlation with site of pain recommended.   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 No fracture or dislocation. Electronically Signed By: Bretta BangWilliam Woodruff M.D. On: 04/17/2014 21:37   Dg Tibia/fibula Left 04/17/2014: No evidence of fracture or dislocation.   Medical Consultants:   Cardiology   Other Consultants:   None   IAnti-Infectives:    Vancomycin 04/17/14---> 04/18/14  Zosyn 04/17/14---> 04/18/14  Levaquin 04/18/14--->   Manson PasseyEVINE, Patriciaann, MD  [facility] Pager [phone]  If 7PM-7AM, please contact night-coverage [email or web address] Password [email or web address] 04/19/2014, 8:29 AM   LOS: 2 days    HPI/Subjective: No acute overnight events.  Objective: Filed Vitals:   04/19/14 0320 04/19/14 0400 04/19/14 0500 04/19/14 0600  BP: 101/64 106/44 109/68 92/58  Pulse:      Temp:  97.8 F (36.6 C)    TempSrc:  Oral    Resp: 22 29 26 25   Height:      Weight:      SpO2: 95% 96% 97% 96%    Intake/Output Summary (Last 24 hours) at 04/19/14 0829 Last data filed at 04/19/14 0700  Gross per 24 hour  Intake 5757.96 ml  Output    365 ml  Net 5392.96 ml    Exam:   General:  Pt is alert, follows commands appropriately, not in acute distress  Cardiovascular: irregular rhythm tachycardic, S1/S2, no murmurs  Respiratory: Clear to auscultation bilaterally, no wheezing, no crackles, no rhonchi  Abdomen: Soft, non tender, non distended,  bowel sounds present  Extremities: No edema, pulses DP and PT palpable bilaterally  Neuro: Grossly nonfocal  Data Reviewed: Basic Metabolic Panel:  Recent Labs Lab 04/18/14 0047  04/18/14 0900 04/18/14 1045 04/18/14 1319 04/18/14 1500 04/18/14 2123 04/19/14 0359  NA 138  < > 144 143 145 145  --  142  K 2.1*  < > 2.7* 2.8* 2.8* 3.0* 3.4* 3.9  CL 98  < > 104 106 109 108  --  111  CO2 27  < > 27 25 24 25   --  22  GLUCOSE 822*  < > 188* 204* 116* 131*  --  198*  BUN 21  < > 21 22 23 23   --  27*  CREATININE 2.58*  < > 2.55* 2.58* 2.73* 2.64*  --  2.67*  CALCIUM 8.0*  < > 7.9* 7.6* 7.9* 7.7*  --  7.3*  MG 1.7  --   --   --   --   --   --  1.8  < > = values in this interval not displayed. Liver Function Tests:  Recent Labs Lab 04/17/14 2033  AST 44*  ALT 17  ALKPHOS 155*  BILITOT 1.1  PROT 7.5  ALBUMIN 2.6*    Recent Labs Lab 04/18/14 0047  LIPASE 29    Recent Labs Lab 04/18/14 0049  AMMONIA  35*   CBC:  Recent Labs Lab 04/17/14 2033 04/18/14 0209 04/19/14 0359  WBC 29.2* 23.5* 21.0*  NEUTROABS 26.8*  --   --   HGB 15.2* 13.3 11.8*  HCT 46.8* 39.3 34.8*  MCV 92.9 89.1 88.5  PLT 485* 306 246   Cardiac Enzymes:  Recent Labs Lab 04/17/14 2033 04/18/14 0017 04/18/14 0900 04/18/14 1500 04/18/14 2123 04/19/14 0359  CKTOTAL 1917*  --   --   --   --  1760*  TROPONINI <0.30 0.18* 0.27* 0.28* 0.21*  --    BNP: Invalid input(s): POCBNP CBG:  Recent Labs Lab 04/18/14 1514 04/18/14 1618 04/18/14 1725 04/18/14 1937 04/18/14 2246  GLUCAP 147* 136* 141* 153* 185*    Urine culture     Status: None   Collection Time: 04/17/14  8:47 PM  Result Value Ref Range Status   Specimen Description URINE, CATHETERIZED  Final   Special Requests Normal  Final   Culture  Setup Time   Final   Culture NO GROWTH  Final   Report Status 04/19/2014 FINAL  Final  MRSA PCR Screening     Status: None   Collection Time: 04/18/14  3:55 AM  Result Value Ref Range  Status   MRSA by PCR NEGATIVE NEGATIVE Final     Scheduled Meds: . insulin aspart  0-15 Units Subcutaneous TID WC  . insulin aspart  0-5 Units Subcutaneous QHS  . insulin aspart  4 Units Subcutaneous TID WC  . insulin glargine  10 Units Subcutaneous QHS  . levofloxacin (LEVAQUIN) IV  750 mg Intravenous Q48H  . metoprolol  5 mg Intravenous 4 times per day  . terbinafine   Topical BID   Continuous Infusions: . sodium chloride 125 mL/hr at 04/19/14 0700  . dextrose 5 % and 0.45% NaCl 50 mL/hr at 04/19/14 0700  . heparin 1,800 Units/hr (04/19/14 0646)

## 2014-04-19 NOTE — Progress Notes (Signed)
ANTICOAGULATION CONSULT NOTE - F/u Consult  Pharmacy Consult for IV Heparin Indication: atrial fibrillation  No Known Allergies  Patient Measurements: Height: 5\' 7"  (170.2 cm) Weight: 300 lb (136.079 kg) IBW/kg (Calculated) : 61.6 Heparin Dosing Weight: 84 kg  Vital Signs: Temp: 97.8 F (36.6 C) (12/23 0400) Temp Source: Oral (12/23 0400) BP: 92/58 mmHg (12/23 0600)  Labs:  Recent Labs  04/17/14 2033  04/18/14 0047 04/18/14 0209  04/18/14 0900 04/18/14 1045 04/18/14 1319 04/18/14 1500 04/18/14 1837 04/18/14 2123 04/19/14 0359  HGB 15.2*  --   --  13.3  --   --   --   --   --   --   --  11.8*  HCT 46.8*  --   --  39.3  --   --   --   --   --   --   --  34.8*  PLT 485*  --   --  306  --   --   --   --   --   --   --  246  APTT  --   --  36  --   --   --   --   --   --   --   --   --   LABPROT 18.2*  --   --   --   --   --   --   --   --   --   --   --   INR 1.49  --   --   --   --   --   --   --   --   --   --   --   HEPARINUNFRC  --   --   --   --   --   --  <0.10*  --   --  0.13*  --  <0.10*  CREATININE 1.92*  --  2.58*  --   < > 2.55* 2.58* 2.73* 2.64*  --   --  2.67*  CKTOTAL 1917*  --   --   --   --   --   --   --   --   --   --  1760*  TROPONINI <0.30  < >  --   --   --  0.27*  --   --  0.28*  --  0.21*  --   < > = values in this interval not displayed.  Estimated Creatinine Clearance: 28.7 mL/min (by C-G formula based on Cr of 2.67).   Medical History: Past Medical History  Diagnosis Date  . Thyroid disease     Medications:  Scheduled:  . antiseptic oral rinse  7 mL Mouth Rinse q12n4p  . chlorhexidine  15 mL Mouth Rinse BID  . insulin aspart  0-15 Units Subcutaneous TID WC  . insulin aspart  0-5 Units Subcutaneous QHS  . insulin aspart  4 Units Subcutaneous TID WC  . insulin glargine  10 Units Subcutaneous QHS  . levofloxacin (LEVAQUIN) IV  250 mg Intravenous Q24H  . metoprolol  5 mg Intravenous 4 times per day  . potassium chloride  10 mEq  Intravenous Q1 Hr x 6  . sodium chloride  3 mL Intravenous Q12H  . terbinafine   Topical BID   Infusions:  . sodium chloride 125 mL/hr at 04/19/14 0600  . dextrose 5 % and 0.45% NaCl 50 mL/hr at 04/19/14 0600  . heparin 1,450 Units/hr (04/19/14 0600)  Assessment: [age] yoF admitted s/p weakness/fall.  Found to be in A-fib.  Given UFH bolus of 4000 units, then started at 1200 units/hr. PMH thyroid disease.  Note also treating for sepsis from CAP. 12/22  Hgb, plt wnl  Baseline INR, aPTT both on high end of normal  1st heparin level SUBtherapeutic at < 0.1.  Per RN, patient pulled line out at ~0900 today, and lumen noted to be bent.  Unknown if bent during removal or prior  No PTA meds per family  No plans yet for outpatient anticoagulation  12/23  HL=<0.10 units/ml, no problems per RN with IV or bleeding    Goal of Therapy:  Heparin level 0.3-0.7 units/ml Monitor platelets by anticoagulation protocol: Yes   Plan:   Increase heparin drip to 1800 units/hr  Recheck HL in 8 hours  Daily heparin level once stable  Daily CBC  F/u for signs of bleeding or other complications  F/u transition to PO anticoag if necessary   Lorenza EvangelistGreen, Meryle Pugmire R 04/19/2014, 6:42 AM

## 2014-04-19 NOTE — Consult Note (Signed)
Reason for Consult: Atrial fibrillation with rapid ventricular response  Requesting Physician: Tried hospitalist  HPI: Ms [name]  is a 68 year old moderately overweight married Caucasian female with no children admitted 2 days ago with presumed pneumonia/sepsis with diabetic ketoacidosis and altered mental status. Her primary care physician is Dr. Creola CornJohn Russo. A chest CT scan suggested pneumonia. She had lactic acidosis, acute kidney injury and rhabdomyolysis from being on the floor at home for 6 hours before she came to medical attention. She has been treated appropriately for her metabolic abnormalities and her infectious disease issues with insulin drip, IV fluids, antibiotics. She was in atrial fibrillation with rapid ventricular response and we were asked to see her for this problem. It's unclear whether this was with a chronic problem although she gives no history of prior A. Fib or heart related issues. A 2-D echocardiogram revealed normal LV systolic function. She did have mild elevation of troponin at 0.2 probably related to "demand ischemia" in her BNP was elevated at 7693. She is 7 L positive. She denies chest pain or shortness of breath.   Problem List: Patient Active Problem List   Diagnosis Date Noted  . Elevated brain natriuretic peptide (BNP) level 04/18/2014  . Atrial fibrillation with RVR 04/18/2014  . Leukocytosis 04/18/2014  . Hypokalemia 04/18/2014  . Rhabdomyolysis 04/18/2014  . AKI (acute kidney injury) 04/18/2014  . High anion gap metabolic acidosis 04/18/2014  . Lactic acidosis 04/18/2014  . Sepsis 04/18/2014  . Altered mental status 04/18/2014  . Elevated troponin 04/18/2014  . Yeast dermatitis 04/18/2014  . HHNC (hyperglycemic hyperosmolar nonketotic coma)   . Hyperglycemia   . Weakness   . Hyperosmolar non-ketotic state in patient with type 2 diabetes mellitus 04/17/2014    PMHx:  Past Medical History  Diagnosis Date  . Thyroid disease    Past  Surgical History  Procedure Laterality Date  . Cholecystectomy      FAMHx: Family History  Problem Relation Age of Onset  . Osteoarthritis Mother   . Cancer Mother   . Diabetes Mother   . Heart failure Mother   . Hypertension Mother   . Osteoarthritis Father   . Cancer Father   . Osteoarthritis Sister   . Cancer Sister   . Osteoarthritis Brother   . Cancer Brother     SOCHx:  reports that she has never smoked. She does not have any smokeless tobacco history on file. She reports that she does not drink alcohol or use illicit drugs.  ALLERGIES: No Known Allergies  ROS: Pertinent items are noted in HPI.  HOME MEDICATIONS: No prescriptions prior to admission    HOSPITAL MEDICATIONS: I have reviewed the patient's current medications.  VITALS: Blood pressure 113/75, pulse 105, temperature 97.9 F (36.6 C), temperature source Oral, resp. rate 20, height 5\' 7"  (1.702 m), weight 300 lb (136.079 kg), SpO2 97 %.  INPUT/OUTPUT I/O last 3 completed shifts: In: 7587.6 [P.O.:580; I.V.:4857.6; IV Piggyback:2150] Out: 405 [Urine:405] Total I/O In: 579 [I.V.:579] Out: 450 [Urine:450]    PHYSICAL EXAM: General appearance: alert and no distress Neck: no adenopathy, no carotid bruit, no JVD, supple, symmetrical, trachea midline and thyroid not enlarged, symmetric, no tenderness/mass/nodules Lungs: clear to auscultation bilaterally Heart: irregularly irregular rhythm Extremities: extremities normal, atraumatic, no cyanosis or edema  LABS:  BMP  Recent Labs  04/18/14 1319 04/18/14 1500 04/18/14 2123 04/19/14 0359  NA 145 145  --  142  K 2.8* 3.0* 3.4* 3.9  CL 109 108  --  111  CO2 24 25  --  22  GLUCOSE 116* 131*  --  198*  BUN 23 23  --  27*  CREATININE 2.73* 2.64*  --  2.67*  CALCIUM 7.9* 7.7*  --  7.3*  GFRNONAA 17* 17*  --  17*  GFRAA 19* 20*  --  20*    CBC  Recent Labs Lab 04/19/14 0359  WBC 21.0*  RBC 3.93  HGB 11.8*  HCT 34.8*  PLT 246  MCV  88.5    HEMOGLOBIN A1C No results found for: HGBA1C, MPG  Cardiac Panel (last 3 results)  Recent Labs  04/17/14 2033  04/18/14 0900 04/18/14 1500 04/18/14 2123 04/19/14 0359  CKTOTAL 1917*  --   --   --   --  1760*  TROPONINI <0.30  < > 0.27* 0.28* 0.21*  --   < > = values in this interval not displayed.  BNP (last 3 results)  Recent Labs  04/17/14 2033  PROBNP 7693.0*    TSH  Recent Labs  04/18/14 0044  TSH 3.143    CHOLESTEROL No results for input(s): CHOL in the last 8760 hours.  Hepatic Function Panel  Recent Labs  04/17/14 2033  PROT 7.5  ALBUMIN 2.6*  AST 44*  ALT 17  ALKPHOS 155*  BILITOT 1.1    IMAGING: Ct Abdomen Pelvis Wo Contrast  04/18/2014   CLINICAL DATA:  Status post fall; found on floor. Sepsis, with progressive weakness for several weeks. Erythema at the groin and abdominal wall. Leukocytosis. Initial encounter.  EXAM: CT ABDOMEN AND PELVIS WITHOUT CONTRAST  TECHNIQUE: Multidetector CT imaging of the abdomen and pelvis was performed following the standard protocol without IV contrast.  COMPARISON:  Lumbar spine radiograph performed 04/17/2014  FINDINGS: Right lower lobe airspace opacification is compatible with pneumonia.  The liver and spleen are unremarkable in appearance. Scattered stones are seen within the gallbladder. The gallbladder is otherwise unremarkable. The pancreas and adrenal glands are unremarkable.  Nonspecific perinephric stranding is noted bilaterally. Mild right-sided pelvicaliectasis remains within normal limits. There is no evidence of hydronephrosis. No renal or ureteral stones are seen.  No free fluid is identified. The small bowel is unremarkable in appearance. The stomach is within normal limits. No acute vascular abnormalities are seen. Minimal calcification is seen along the abdominal aorta and its branches.  The appendix is not definitely seen; there is no evidence for appendicitis. The colon is unremarkable in  appearance.  A small-to-moderate umbilical hernia is noted, containing only fat.  The bladder is largely decompressed and grossly unremarkable. A large 8.0 cm fibroid is suspected within the uterus. The uterus is mildly enlarged. The ovaries are grossly symmetric. No suspicious adnexal masses are seen. Stop No inguinal lymphadenopathy is seen.  No acute osseous abnormalities are identified.  IMPRESSION: 1. Right lower lobe pneumonia noted; this likely explains the patient's symptoms. 2. Cholelithiasis; gallbladder otherwise unremarkable. 3. Small to moderate umbilical hernia, containing only fat. 4. Mildly enlarged fibroid uterus noted.   Electronically Signed   By: Roanna Raider M.D.   On: 04/18/2014 05:07   Dg Chest 2 View  04/17/2014   CLINICAL DATA:  Pt arrived via EMS with report of falling without injury d/t "legs given out" on concrete garage floor since 1300 and was found by spouse ast 1845. EMS reported that CBG was greater than 600, poor appetite, noncompliant with diet d/t noted soft drinks and sweeten tea in house, strong urine smell, and red rash to abd folds.  Pt c/o lower back pain, large bruising to left hip and groin, denies any chest complaints, best obtainable images due to pt size and condition  EXAM: CHEST  2 VIEW  COMPARISON:  None.  FINDINGS: There are low lung volumes with patient rotation to the right. The lateral view is limited. There is elevation of the right hemidiaphragm. The aorta appears ectatic and somewhat unfolded. The heart size is normal. Mild atelectasis is present at the right lung base. There is no consolidation, significant pleural effusion or pneumothorax. No acute osseous findings identified.  IMPRESSION: Elevated right hemidiaphragm with associated right basilar atelectasis. No consolidation or significant edema.   Electronically Signed   By: Roxy Horseman M.D.   On: 04/17/2014 21:36   Dg Lumbar Spine 2-3 Views  04/17/2014   CLINICAL DATA:  Fall.  Weakness.  Initial  evaluation.  EXAM: LUMBAR SPINE - 2-3 VIEW  COMPARISON:  None.  FINDINGS: Five lumbar type vertebral bodies are present. There is mild levoscoliosis. Vertebral bodies are otherwise normally aligned with preservation of the normal lumbar lordosis.  There is anterior wedging of the L1 and L2 vertebral bodies, favored to be chronic in nature. Vertebral body heights are otherwise preserved. No definite acute fracture listhesis.  No soft tissue abnormality.  Moderate multilevel degenerative disc disease present within the visualized spine.  IMPRESSION: 1. No definite acute traumatic injury within the lumbar spine. 2. Anterior wedging of the L1 and L2 vertebral bodies, favored to be chronic in nature. Possible acute compression fractures are not entirely excluded. Correlation with site of pain recommended.   Electronically Signed   By: Rise Mu M.D.   On: 04/17/2014 21:37   Dg Pelvis 1-2 Views  04/17/2014   CLINICAL DATA:  Patient fell on concrete floor earlier in the day  EXAM: PELVIS - 1-2 VIEW  COMPARISON:  None.  FINDINGS: There is no evidence of pelvic fracture or dislocation. 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 No erosive change.  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 No fracture or dislocation.   Electronically Signed   By: Bretta Bang M.D.   On: 04/17/2014 21:37   Dg Tibia/fibula Left  04/17/2014   CLINICAL DATA:  Status post fall; acute onset of left lower leg pain. Initial encounter.  EXAM: LEFT TIBIA AND FIBULA - 2 VIEW  COMPARISON:  None.  FINDINGS: There is no evidence of fracture or dislocation. The tibia and fibula appear intact.  Marginal osteophytes are seen arising at the medial and lateral compartments of the knee, with wall osteophytes also seen. No knee joint effusion is identified.  The ankle mortise is incompletely assessed, but  appears grossly unremarkable. A plantar calcaneal spur is incidentally noted. No significant soft tissue abnormalities are identified.  IMPRESSION: No evidence of fracture or dislocation.   Electronically Signed   By: Roanna Raider M.D.   On: 04/17/2014 22:52    Tele- Afib with VR 100-110  IMPRESSION: 1. A. Fib with RVR-her heart rate is in the low 100 range. Her blood pressure is in the low 100 range as well. She has normal LV systolic function. I suspect her A. Fib is related to her pneumonia/sepsis and metabolic abnormalities. She is currently on IV heparin. She is also written for IV beta blocker for rate control. I suspect that her A. Fib will spontaneously convert to  sinus rhythm once her infectious and metabolic modalities have been adequately addressed. 2. Positive troponin-she had a low level troponin leak of 0.2 which I suspect was related to demand ischemia requires no further workup. 3. pneumonia-sepsis-currently being treated with antibiotics 4. DKA-being properly treated by the primary care team with an insulin drip, IV fluids and replenishment of electrolytes. 5. Altered mental status-this appears to have been improving with treatment of her metabolic Modalities. 6. Elevated BNP-this is probably related to her metabolic analysis well. She is 7 L positive secondary to appropriate treatment of her DKA. Her urinary output is approximately 100 mL per hour. She does not appear wet on exam. Her chest x-ray showed no evidence of congestive heart failure.  RECOMMENDATION: 1. A. Fib with RVR secondary to pneumonia/sepsis and metabolic abnormalities related to DKA. Her heart rate below 100 range and is being appropriately managed with intravenous low-dose beta blockers. She has an IV heparin. Her LV function is normal. An IV Cardizem drip can be started should she require more careful titration. Her blood pressure is somewhat soft however. She has normal LV function by 2-D echo. I suspect that  her rhythm will convert to regular rhythm once her primary problem is adequately treated. We will be happy to follow her along with you.  Time Spent Directly with Patient: 30 minutes  Justyce Yeater J 04/19/2014, 10:39 AM

## 2014-04-20 LAB — BASIC METABOLIC PANEL
Anion gap: 5 (ref 5–15)
BUN: 24 mg/dL — ABNORMAL HIGH (ref 6–23)
CO2: 24 mmol/L (ref 19–32)
CREATININE: 2.06 mg/dL — AB (ref 0.50–1.10)
Calcium: 7.7 mg/dL — ABNORMAL LOW (ref 8.4–10.5)
Chloride: 115 mEq/L — ABNORMAL HIGH (ref 96–112)
GFR, EST AFRICAN AMERICAN: 27 mL/min — AB (ref >90)
GFR, EST NON AFRICAN AMERICAN: 23 mL/min — AB (ref >90)
Glucose, Bld: 218 mg/dL — ABNORMAL HIGH (ref 70–99)
POTASSIUM: 3.1 mmol/L — AB (ref 3.5–5.1)
Sodium: 144 mmol/L (ref 135–145)

## 2014-04-20 LAB — CBC
HEMATOCRIT: 33.5 % — AB (ref 36.0–46.0)
Hemoglobin: 11.1 g/dL — ABNORMAL LOW (ref 12.0–15.0)
MCH: 29.8 pg (ref 26.0–34.0)
MCHC: 33.1 g/dL (ref 30.0–36.0)
MCV: 90.1 fL (ref 78.0–100.0)
PLATELETS: 235 10*3/uL (ref 150–400)
RBC: 3.72 MIL/uL — AB (ref 3.87–5.11)
RDW: 14.3 % (ref 11.5–15.5)
WBC: 12.7 10*3/uL — AB (ref 4.0–10.5)

## 2014-04-20 LAB — CK: Total CK: 747 U/L — ABNORMAL HIGH (ref 7–177)

## 2014-04-20 LAB — GLUCOSE, CAPILLARY
GLUCOSE-CAPILLARY: 157 mg/dL — AB (ref 70–99)
Glucose-Capillary: 137 mg/dL — ABNORMAL HIGH (ref 70–99)
Glucose-Capillary: 144 mg/dL — ABNORMAL HIGH (ref 70–99)
Glucose-Capillary: 174 mg/dL — ABNORMAL HIGH (ref 70–99)
Glucose-Capillary: 188 mg/dL — ABNORMAL HIGH (ref 70–99)
Glucose-Capillary: 205 mg/dL — ABNORMAL HIGH (ref 70–99)

## 2014-04-20 LAB — HEPARIN LEVEL (UNFRACTIONATED): Heparin Unfractionated: 0.19 IU/mL — ABNORMAL LOW (ref 0.30–0.70)

## 2014-04-20 MED ORDER — MAGIC MOUTHWASH W/LIDOCAINE
2.0000 mL | Freq: Four times a day (QID) | ORAL | Status: DC
  Administered 2014-04-20 – 2014-04-24 (×9): 2 mL via ORAL
  Filled 2014-04-20 (×23): qty 5

## 2014-04-20 MED ORDER — HEPARIN SODIUM (PORCINE) 5000 UNIT/ML IJ SOLN
5000.0000 [IU] | Freq: Three times a day (TID) | INTRAMUSCULAR | Status: DC
  Administered 2014-04-20 – 2014-04-25 (×15): 5000 [IU] via SUBCUTANEOUS
  Filled 2014-04-20 (×17): qty 1

## 2014-04-20 MED ORDER — POTASSIUM CHLORIDE CRYS ER 20 MEQ PO TBCR
40.0000 meq | EXTENDED_RELEASE_TABLET | Freq: Once | ORAL | Status: DC
  Filled 2014-04-20: qty 2

## 2014-04-20 MED ORDER — POTASSIUM CHLORIDE 10 MEQ/100ML IV SOLN
10.0000 meq | INTRAVENOUS | Status: AC
  Administered 2014-04-20 (×3): 10 meq via INTRAVENOUS
  Filled 2014-04-20 (×3): qty 100

## 2014-04-20 MED ORDER — MORPHINE SULFATE 2 MG/ML IJ SOLN: 1.0000 mg | INTRAMUSCULAR | Status: DC | PRN

## 2014-04-20 NOTE — Progress Notes (Signed)
SUBJECTIVE:  No acute complaints.  Weak   PHYSICAL EXAM Filed Vitals:   04/20/14 0300 04/20/14 0400 04/20/14 0500 04/20/14 0600  BP:  99/40    Pulse:  79    Temp:  98.1 F (36.7 C)    TempSrc:  Oral    Resp: 20 20 22 25   Height:      Weight:  252 lb 13.9 oz (114.7 kg)    SpO2:  98% 87% 93%   General:  No acute distress Lungs:  Decreased breath sounds Heart:  RRR with ectopy Abdomen:  Positive bowel sounds, no rebound no guarding Extremities:  Diffuse edema   LABS: Lab Results  Component Value Date   TROPONINI 0.21* 04/18/2014   Results for orders placed or performed during the hospital encounter of 04/17/14 (from the past 24 hour(s))  Glucose, capillary     Status: Abnormal   Collection Time: 04/19/14  7:31 AM  Result Value Ref Range   Glucose-Capillary 227 (H) 70 - 99 mg/dL   Comment 1 Documented in Chart    Comment 2 Notify RN   Glucose, capillary     Status: Abnormal   Collection Time: 04/19/14 12:28 PM  Result Value Ref Range   Glucose-Capillary 232 (H) 70 - 99 mg/dL   Comment 1 Documented in Chart    Comment 2 Notify RN   Heparin level (unfractionated)     Status: Abnormal   Collection Time: 04/19/14  3:37 PM  Result Value Ref Range   Heparin Unfractionated 0.22 (L) 0.30 - 0.70 IU/mL  Glucose, capillary     Status: Abnormal   Collection Time: 04/19/14  4:15 PM  Result Value Ref Range   Glucose-Capillary 206 (H) 70 - 99 mg/dL   Comment 1 Documented in Chart    Comment 2 Notify RN   Glucose, capillary     Status: Abnormal   Collection Time: 04/19/14  9:08 PM  Result Value Ref Range   Glucose-Capillary 188 (H) 70 - 99 mg/dL  Basic metabolic panel     Status: Abnormal   Collection Time: 04/20/14  1:50 AM  Result Value Ref Range   Sodium 144 135 - 145 mmol/L   Potassium 3.1 (L) 3.5 - 5.1 mmol/L   Chloride 115 (H) 96 - 112 mEq/L   CO2 24 19 - 32 mmol/L   Glucose, Bld 218 (H) 70 - 99 mg/dL   BUN 24 (H) 6 - 23 mg/dL   Creatinine, Ser 0.632.06 (H) 0.50 -  1.10 mg/dL   Calcium 7.7 (L) 8.4 - 10.5 mg/dL   GFR calc non Af Amer 23 (L) >90 mL/min   GFR calc Af Amer 27 (L) >90 mL/min   Anion gap 5 5 - 15  CK     Status: Abnormal   Collection Time: 04/20/14  1:50 AM  Result Value Ref Range   Total CK 747 (H) 7 - 177 U/L  CBC     Status: Abnormal   Collection Time: 04/20/14  1:50 AM  Result Value Ref Range   WBC 12.7 (H) 4.0 - 10.5 K/uL   RBC 3.72 (L) 3.87 - 5.11 MIL/uL   Hemoglobin 11.1 (L) 12.0 - 15.0 g/dL   HCT 01.633.5 (L) 01.036.0 - 93.246.0 %   MCV 90.1 78.0 - 100.0 fL   MCH 29.8 26.0 - 34.0 pg   MCHC 33.1 30.0 - 36.0 g/dL   RDW 35.514.3 73.211.5 - 20.215.5 %   Platelets 235 150 - 400 K/uL  Heparin  level (unfractionated)     Status: Abnormal   Collection Time: 04/20/14  1:51 AM  Result Value Ref Range   Heparin Unfractionated 0.19 (L) 0.30 - 0.70 IU/mL    Intake/Output Summary (Last 24 hours) at 04/20/14 0645 Last data filed at 04/20/14 0600  Gross per 24 hour  Intake 4107.92 ml  Output   1951 ml  Net 2156.92 ml     ASSESSMENT AND PLAN:  ATRIAL FIB WITH RVR:  Now in NSR with frequent atrial ectopy.  I would continue the beta blocker today if her BP allows.  Can change to DVT prophylaxis per primary team.   ELEVATED TROPONIN:  Thought to be demand ischemia.  No further work up is planned.    Fayrene FearingJames [facility] 04/20/2014 6:45 AM

## 2014-04-20 NOTE — Progress Notes (Signed)
ANTICOAGULATION CONSULT NOTE - Follow up  Pharmacy Consult for IV Heparin Indication: atrial fibrillation  No Known Allergies  Patient Measurements: Height: 5\' 7"  (170.2 cm) Weight: 300 lb (136.079 kg) IBW/kg (Calculated) : 61.6 Heparin Dosing Weight: 84 kg  Vital Signs: Temp: 99.5 F (37.5 C) (12/24 0000) Temp Source: Oral (12/24 0000) BP: 114/59 mmHg (12/24 0000) Pulse Rate: 76 (12/23 2000)  Labs:  Recent Labs  04/17/14 2033  04/18/14 0047 04/18/14 0209  04/18/14 0900  04/18/14 1500  04/18/14 2123 04/19/14 0359 04/19/14 1537 04/20/14 0150 04/20/14 0151  HGB 15.2*  --   --  13.3  --   --   --   --   --   --  11.8*  --  11.1*  --   HCT 46.8*  --   --  39.3  --   --   --   --   --   --  34.8*  --  33.5*  --   PLT 485*  --   --  306  --   --   --   --   --   --  246  --  235  --   APTT  --   --  36  --   --   --   --   --   --   --   --   --   --   --   LABPROT 18.2*  --   --   --   --   --   --   --   --   --   --   --   --   --   INR 1.49  --   --   --   --   --   --   --   --   --   --   --   --   --   HEPARINUNFRC  --   --   --   --   --   --   < >  --   < >  --  <0.10* 0.22*  --  0.19*  CREATININE 1.92*  --  2.58*  --   < > 2.55*  < > 2.64*  --   --  2.67*  --  2.06*  --   CKTOTAL 1917*  --   --   --   --   --   --   --   --   --  1760*  --  747*  --   TROPONINI <0.30  < >  --   --   --  0.27*  --  0.28*  --  0.21*  --   --   --   --   < > = values in this interval not displayed.  Estimated Creatinine Clearance: 37.2 mL/min (by C-G formula based on Cr of 2.06).  Assessment: [age] yoF admitted s/p weakness/fall.  Found to be in A-fib.  Given UFH bolus of 4000 units, then started at 1200 units/hr. PMH thyroid disease.  Note also treating for sepsis from CAP. Hgb, plt wnl. Baseline INR, aPTT both on high end of normal  Significant events: 12/22: 1st heparin level <0.1 on 1200units/hr. Per RN, patient had pulled line out and the lumen noted to be bent.  Unknown if bent  during removal or prior. Heparin level rechecked, 0.13. Increased to 1450units/hr.  12/23: HL <0.1, increased to 1800units/hr. Rechecked and still low at 0.22, increased  to 1950units/hr.  Today, 12/24: HL 0.19 on 1950units/hr. No interruptions or problems per RN.  CBC ok. No bleeding reported/documented. SCr improving.   Goal of Therapy:  Heparin level 0.3-0.7 units/ml Monitor platelets by anticoagulation protocol: Yes   Plan:   Increase heparin drip to 2300units/hr  Recheck HL in 8 hours  Daily heparin level once stable  Daily CBC  F/u for signs of bleeding or other complications  F/u transition to PO anticoag if necessary  Charolotte Ekeom Jaysiah Marchetta, PharmD, pager [phone]. 04/20/2014,3:04 AM.

## 2014-04-20 NOTE — Progress Notes (Signed)
PT Cancellation Note  Patient Details Name: [name] MRN: [id number] DOB: 01/17/1946   Cancelled Treatment:     PT eval attempted x 2.  Am session deferred with pt stating "I am finally getting something to eat".  Re-attempted in pm with pt refusing 2* husbands imminent arrival "and he has a lot of problems".  Will follow.   Tamya Denardo 04/20/2014, 3:52 PM

## 2014-04-20 NOTE — Progress Notes (Signed)
Clinical Social Work Department BRIEF PSYCHOSOCIAL ASSESSMENT 04/20/2014  Patient:  [name], Natalie [name]     Account Number:  [id number]     Admit date:  04/17/2014  Clinical Social Worker:  Natalie [name]  Date/Time:  04/20/2014 12:30 PM  Referred by:  Physician  Date Referred:  04/20/2014 Referred for  SNF Placement  Psychosocial assessment   Other Referral:   Interview type:  Patient Other interview type:    PSYCHOSOCIAL DATA Living Status:  FAMILY Admitted from facility:   Level of care:   Primary support name:  Natalie [name] Primary support relationship to patient:  SPOUSE Degree of support available:   Adequate    CURRENT CONCERNS Current Concerns  Post-Acute Placement   Other Concerns:    SOCIAL WORK ASSESSMENT / PLAN CSW received referral to assist with DC planning. CSW reviewed chart and spoke with bedside RN who reports concerns about possible self-neglect. CSW reviewed chart which stated PT has been ordered to evaluate patient. CSW met with patient and sister Natalie [name]) at bedside. CSW introduced myself and explained role.    Patient lives at home with husband. 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 Husband is often gone during the day and patient stays alone. Patient had [name] dtr who passed away but has [name] brother and sister who is supportive.    Patient reports she has been weak at the hospital and unable to get out of bed. Patient reports she was independent at home prior to admission and plans to return home at DC. CSW explained that MD has ordered PT but voiced concern about patient returning home if patient is not stable and does not have 24 hour support. Patient reports she would never go to [name] and does not want any information. CSW explained process and Medicare coverage for SNF but patient still refusing and reports she will return home as soon as possible.    Sister stepped outside of room and spoke with CSW in the hallway. Sister  reports she does not feel comfortable speaking in front of patient but has concerns about her returning home. Patient usually stays in the garage because she cannot get around the house well. Husband wants patient to return home as well to assist with the business. Sister reports patient has [name] social issue and does not allow people into her home and does not allow others to help her. Sister reports that patient and husband have the finances to pay for help but will not reach out for help. Sister understanding that patient cannot be forced to go to SNF against her will even if it seems like it is the best decision for her. Sister understanding and reports she and brother will try and talk to patient as well.    CSW will continue to follow after PT evaluates but patient refusing SNF even if PT recommends placement at this time.   Assessment/plan status:  Psychosocial Support/Ongoing Assessment of Needs Other assessment/ plan:   Information/referral to community resources:   SNF information    PATIENT'S/FAMILY'S RESPONSE TO PLAN OF CARE: Patient alert and oriented. Patient has flat affect and guarded when discussing SNF. No concerns for abuse presented but sister  believes that patient is neglecting her wellbeing. Patient reports no needs from CSW and is hoping to DC soon. Patient reports no safety concerns with returning home and that family can assist as needed. Sister thanked CSW for speaking with her but is aware that patient has ability  to make her own decisions.       Sindy Messing, LCSW  (Coverage for [employer])

## 2014-04-20 NOTE — Progress Notes (Addendum)
Patient ID: [name], female   DOB: 04/07/1946, 68 y.o.   MRN: [id number] [facility] PROGRESS NOTE  [name] JYN:[id number] DOB: 09/08/1945 DOA: 04/17/2014 PCP: No primary care provider on file.  Brief narrative:    68 y.o. female with a PMH of thyroid disease, not on home medications, who was admitted 04/17/14 after becoming weak and falling at home. There was no loss of consciousness. Family reports generalized failure to thrive over the past 3-4 years. Upon initial evaluation in the ED, the patient was found to have a potassium of 2.4, a glucose of 953, a creatinine of 1.92, WBC of 29.2, CK 1917, proBNP 7693.0 and a lactic acid of 8.74. She was admitted for evaluation and treatment of sepsis, HONK, and rhabdomyolysis.   Hospital course is complicated with development of atrial fibrillation with rapid ventricular rate, patient was started on heparin drip and cardiology has seen the patient in consultation. Patient has converted to sinus rhythm and per cardiology we stopped heparin drip 04/20/2014.   Assessment/Plan:     Principal Problem:  Sepsis secondary to community-acquired pneumonia with lactic acidosis and leukocytosis  Based on CT scan, right lower lobe pneumonia. Chest x-ray did not show significant infiltrates. Urinalysis did not reveal UTI.  Blood cultures are pending. Urine culture shows no growth.  Patient was initially on empiric antibiotics, Zosyn and vancomycin but this was narrowed down to Levaquin.  Leukocytosis is improving, 21 --> 12.7.   Active Problems:   New atrial fibrillation  Appreciate cardiology consult and recommendations. Patient was on heparin drip but stopped 04/20/2014 because patient converted to sinus rhythm.  Continue beta blockers, she is currently on metoprolol 25 mg PO BID   Hyperosmolar non-ketotic state in patient with type 2 diabetes mellitus / high anion gap metabolic acidosis  Anion gap 25 on admission. Based on BMP  04/19/2014, anion gap is within normal limits. CO2 is 27.  Current insulin regimen includes Lantus 10 units at bedtime along with NovoLog 4 units 3 times daily with meals and sliding scale insulin.   Elevated brain natriuretic peptide (BNP) level / elevated troponin  2-D echocardiogram shows preserved ejection fraction.  Troponin elevation likely from demand ischemia. Troponin level trended down, last 2 sets: 0.27 --> 0.21  Pt initially on heparin but this stopped 04/20/2014.Marland Kitchen.  Continue beta blockers.   Yeast dermatitis  Lamisil cream ordered.   History of thyroid disease  TSH is within normal limits.   Hypokalemia  Magnesium level within normal limits. Potassium being supplemented.   Rhabdomyolysis  Continue low rate IV fluids because of high BNP level.  CPK down from 1760 to 747.  Follow up CPK in am.      AKI (acute kidney injury)  Likely secondary to sepsis and rhabdomyolysis versus possible chronic kidney disease but we do not have previous values for comparison.  Creatinine has improved with IV fluids.   Altered mental status / metabolic encephalopathy  Unclear etiology. Patient is better this morning, she is oriented to time, place and person.   DVT Prophylaxis  Heparin subcutaneous for DVT prophylaxis.  Code Status: Full. Family Communication: No family at the bedside. Disposition Plan: home when stable.    IV access:   Peripheral IV  Procedures and diagnostic studies:     Ct Abdomen Pelvis Wo Contrast 04/18/2014: 1. Right lower lobe pneumonia noted; this likely explains the patient's symptoms. 2. Cholelithiasis; gallbladder otherwise unremarkable. 3. Small to moderate umbilical hernia, containing only fat. 4. Mildly  enlarged fibroid uterus noted. Electronically Signed By: Roanna Raider M.D. On: 04/18/2014 05:07   Dg Chest 2 View 04/17/2014: Elevated right hemidiaphragm with associated right basilar atelectasis. No consolidation or  significant edema. Electronically Signed By: Roxy Horseman M.D. On: 04/17/2014 21:36   Dg Lumbar Spine 2-3 Views 04/17/2014: 1. No definite acute traumatic injury within the lumbar spine. 2. Anterior wedging of the L1 and L2 vertebral bodies, favored to be chronic in nature. Possible acute compression fractures are not entirely excluded. Correlation with site of pain recommended.   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 No fracture or dislocation. Electronically Signed By: Bretta Bang M.D. On: 04/17/2014 21:37   Dg Tibia/fibula Left 04/17/2014: No evidence of fracture or dislocation.   Medical Consultants:   Cardiology   Other Consultants:   None   IAnti-Infectives:    Vancomycin 04/17/14---> 04/18/14  Zosyn 04/17/14---> 04/18/14  Levaquin 04/18/14--->   Natalie Passey, MD  [facility] Pager [phone]  If 7PM-7AM, please contact night-coverage [email or web address] Password [email or web address] 04/20/2014, 11:02 AM   LOS: 3 days    HPI/Subjective: No acute overnight events.  Objective: Filed Vitals:   04/20/14 0705 04/20/14 0800 04/20/14 0900 04/20/14 1000  BP: 126/106     Pulse:      Temp:  97.6 F (36.4 C)    TempSrc:  Oral    Resp: 22 24 26 22   Height:      Weight:      SpO2: 95% 97% 94% 93%    Intake/Output Summary (Last 24 hours) at 04/20/14 1102 Last data filed at 04/20/14 1000  Gross per 24 hour  Intake 3590.6 ml  Output   1776 ml  Net 1814.6 ml    Exam:   General:  Pt is alert, follows commands appropriately, not in acute distress  Cardiovascular: rate controlled, irregular, S1/S2 appreciated   Respiratory: no wheezing, no crackles, no rhonchi  Abdomen: non tender, non distended, bowel sounds present  Extremities: pulses DP and PT palpable bilaterally  Neuro: Grossly nonfocal  Data Reviewed: Basic Metabolic Panel:  Recent Labs Lab  04/18/14 0047  04/18/14 1045 04/18/14 1319 04/18/14 1500 04/18/14 2123 04/19/14 0359 04/20/14 0150  NA 138  < > 143 145 145  --  142 144  K 2.1*  < > 2.8* 2.8* 3.0* 3.4* 3.9 3.1*  CL 98  < > 106 109 108  --  111 115*  CO2 27  < > 25 24 25   --  22 24  GLUCOSE 822*  < > 204* 116* 131*  --  198* 218*  BUN 21  < > 22 23 23   --  27* 24*  CREATININE 2.58*  < > 2.58* 2.73* 2.64*  --  2.67* 2.06*  CALCIUM 8.0*  < > 7.6* 7.9* 7.7*  --  7.3* 7.7*  MG 1.7  --   --   --   --   --  1.8  --   < > = values in this interval not displayed. Liver Function Tests:  Recent Labs Lab 04/17/14 2033  AST 44*  ALT 17  ALKPHOS 155*  BILITOT 1.1  PROT 7.5  ALBUMIN 2.6*    Recent Labs Lab 04/18/14 0047  LIPASE 29    Recent Labs Lab 04/18/14 0049  AMMONIA 35*   CBC:  Recent Labs Lab 04/17/14 2033 04/18/14 0209 04/19/14 0359 04/20/14 0150  WBC 29.2* 23.5* 21.0* 12.7*  NEUTROABS 26.8*  --   --   --   HGB 15.2* 13.3 11.8* 11.1*  HCT 46.8* 39.3 34.8* 33.5*  MCV 92.9 89.1 88.5 90.1  PLT 485* 306 246 235   Cardiac Enzymes:  Recent Labs Lab 04/17/14 2033 04/18/14 0017 04/18/14 0900 04/18/14 1500 04/18/14 2123 04/19/14 0359 04/20/14 0150  CKTOTAL 1917*  --   --   --   --  1760* 747*  TROPONINI <0.30 0.18* 0.27* 0.28* 0.21*  --   --    BNP: Invalid input(s): POCBNP CBG:  Recent Labs Lab 04/19/14 0731 04/19/14 1228 04/19/14 1615 04/19/14 2108 04/20/14 0759  GLUCAP 227* 232* 206* 188* 205*    Urine culture     Status: None   Collection Time: 04/17/14  8:47 PM  Result Value Ref Range Status   Specimen Description URINE, CATHETERIZED  Final   Special Requests Normal  Final   Culture  Setup Time   Final    04/18/2014 04:23 Performed at [facility]    Colony Count NO GROWTH Performed at [facility]   Final   Report Status 04/19/2014 FINAL  Final  MRSA PCR Screening     Status: None   Collection Time: 04/18/14  3:55 AM  Result Value Ref Range  Status   MRSA by PCR NEGATIVE NEGATIVE Final     Scheduled Meds: . heparin subcutaneous  5,000 Units Subcutaneous 3 times per day  . insulin aspart  0-15 Units Subcutaneous TID WC  . insulin aspart  0-5 Units Subcutaneous QHS  . insulin aspart  4 Units Subcutaneous TID WC  . insulin glargine  10 Units Subcutaneous QHS  . levofloxacin (LEVAQUIN) IV  750 mg Intravenous Q48H  . metoprolol  5 mg Intravenous 4 times per day  . sodium chloride  3 mL Intravenous Q12H  . terbinafine   Topical BID   Continuous Infusions: . sodium chloride 50 mL/hr at 04/20/14 0600  . dextrose 5 % and 0.45% NaCl 50 mL/hr at 04/20/14 0600

## 2014-04-21 LAB — GLUCOSE, CAPILLARY
GLUCOSE-CAPILLARY: 161 mg/dL — AB (ref 70–99)
Glucose-Capillary: 140 mg/dL — ABNORMAL HIGH (ref 70–99)
Glucose-Capillary: 189 mg/dL — ABNORMAL HIGH (ref 70–99)
Glucose-Capillary: 112 mg/dL — ABNORMAL HIGH (ref 70–99)

## 2014-04-21 MED ORDER — METOPROLOL TARTRATE 25 MG PO TABS
25.0000 mg | ORAL_TABLET | Freq: Two times a day (BID) | ORAL | Status: DC
  Administered 2014-04-21: 25 mg via ORAL
  Filled 2014-04-21 (×2): qty 1

## 2014-04-21 NOTE — Progress Notes (Signed)
    SUBJECTIVE:  Episode of confusion last night after morphine.   Still very confused.    PHYSICAL EXAM Filed Vitals:   04/20/14 2110 04/20/14 2255 04/21/14 0115 04/21/14 0550  BP: 120/51 106/66 115/96 145/77  Pulse: 111 84 84 119  Temp: 98.4 F (36.9 C) 98.5 F (36.9 C)  97.7 F (36.5 C)  TempSrc: Oral Oral  Oral  Resp: 20 24  24   Height:      Weight:      SpO2: 98% 94%  100%   General:  No acute distress, confused Lungs:  Decreased breath sounds Heart:  RRR with ectopy Abdomen:  Positive bowel sounds, no rebound no guarding Extremities:  Mild diffuse edema   LABS:  Results for orders placed or performed during the hospital encounter of 04/17/14 (from the past 24 hour(s))  Glucose, capillary     Status: Abnormal   Collection Time: 04/20/14 11:56 AM  Result Value Ref Range   Glucose-Capillary 174 (H) 70 - 99 mg/dL  Glucose, capillary     Status: Abnormal   Collection Time: 04/20/14  4:32 PM  Result Value Ref Range   Glucose-Capillary 157 (H) 70 - 99 mg/dL  Glucose, capillary     Status: Abnormal   Collection Time: 04/20/14  9:13 PM  Result Value Ref Range   Glucose-Capillary 144 (H) 70 - 99 mg/dL   Comment 1 Documented in Chart    Comment 2 Notify RN   Glucose, capillary     Status: Abnormal   Collection Time: 04/20/14 11:56 PM  Result Value Ref Range   Glucose-Capillary 137 (H) 70 - 99 mg/dL  Glucose, capillary     Status: Abnormal   Collection Time: 04/21/14  7:35 AM  Result Value Ref Range   Glucose-Capillary 140 (H) 70 - 99 mg/dL    Intake/Output Summary (Last 24 hours) at 04/21/14 1042 Last data filed at 04/21/14 0700  Gross per 24 hour  Intake 1790.44 ml  Output   1300 ml  Net 490.44 ml     ASSESSMENT AND PLAN:  ATRIAL FIB WITH RVR:  Now in NSR with frequent atrial ectopy.   Start on oral beta blocker.  She is having short runs of atrial fib and frequent atrial ectopy.    [name] has a CHA2DS2 - VASc score of 3 with a risk of stroke of  3.2%.  Consideration should be given to long term anticoagulation.  However, this can be deferred until she is much recovered from acute events and we see what her final creat clearance is going to be and situation with anticoagulation compliance.  She could also have an out patient Holter to see, after she has recovered, if she still has any evidence of atrial fib.  Starting anticoagulation now would be higher risk than benefit.   ELEVATED TROPONIN:  Thought to be demand ischemia.  No further work up is planned.   Please call with further questions.   Fayrene FearingJames [facility] 04/21/2014 10:42 AM

## 2014-04-21 NOTE — Progress Notes (Signed)
Notified NP on call of change in mental status of patient.  Pt unable to focus, unable to state her name and birthday and speech is now slurred.  Only medication given was prn Morphine IV.  Vitals stable, EKG performed, and CBG WNL.  Patient is not in any acute respiratory distress.  Will continue to keep a close watch on patient and notify on-call of any new changes.

## 2014-04-21 NOTE — Evaluation (Signed)
Physical Therapy Evaluation Patient Details Name: [name] MRN: [id number] DOB: 08/05/1945 Today's Date: 04/21/2014   History of Present Illness    68 y.o. female with a PMH of thyroid disease, not on home medications, who was admitted 04/17/14 after becoming weak and falling at home. There was no loss of consciousness. Family reports generalized failure to thrive over the past 3-4 years. She was admitted for evaluation and treatment of sepsis, HONK, and rhabdomyolysis.   Clinical Impression  Pt admitted as above and with increased level of confusion, generalized weakness and elevated anxiety level limiting functional mobility.  Pt is currently having difficulty focusing on task and following cues and states repeatedly "I can't do anything, you have to do it all, I can't help"  Pt would benefit from follow up rehab at SNF level.  However, if confusion clears, pt may progress to d/c home with follow up HHPT.   Follow Up Recommendations Home health PT;SNF;Supervision/Assistance - 24 hour (Dependent on acute stay progress and level of assist at home)    Equipment Recommendations       Recommendations for Other Services OT consult     Precautions / Restrictions Precautions Precautions: Fall Precaution Comments: Pt confused and with difficulty following commands. Restrictions Weight Bearing Restrictions: No      Mobility  Bed Mobility Overal bed mobility: Needs Assistance;+2 for physical assistance Bed Mobility: Sit to Supine       Sit to supine: Mod assist;+2 for physical assistance   General bed mobility comments: cues for sequence with assist to manage trunk and LEs  Transfers Overall transfer level: Needs assistance Equipment used: Rolling walker (2 wheeled) Transfers: Sit to/from Stand Sit to Stand: Mod assist;+2 physical assistance;+2 safety/equipment Stand pivot transfers: Mod assist;+2 physical assistance       General transfer comment: cues for transition  position and use of UEs to self assist: on stand pvt with RW, pt completed partial turn and then froze in place unable/unwilling to follow cues to move feet.  Bed pulled behind pt and pt assisted to sitting  Ambulation/Gait             General Gait Details: Pt with difficulty following cues to complete stand pvt.  Unsafe at this time to attempt ambulation 2* pt size and level of confusion  Stairs            Wheelchair Mobility    Modified Rankin (Stroke Patients Only)       Balance Overall balance assessment: Needs assistance Sitting-balance support: Feet supported Sitting balance-Leahy Scale: Good     Standing balance support: Bilateral upper extremity supported Standing balance-Leahy Scale: Poor                               Pertinent Vitals/Pain Pain Assessment: No/denies pain    Home Living Family/patient expects to be discharged to:: Private residence                 Additional Comments: Pt unable to provide reliable information this am.  Nursing reports increased confusion this date    Prior Function Level of Independence: Independent         Comments: Based on pt report during brief session 04/20/14     Hand Dominance        Extremity/Trunk Assessment   Upper Extremity Assessment: Generalized weakness           Lower Extremity Assessment: Generalized weakness  Communication   Communication: Other (comment) (confused and difficulty following cues)  Cognition Arousal/Alertness: Awake/alert Behavior During Therapy: Anxious;Impulsive Overall Cognitive Status: Impaired/Different from baseline Area of Impairment: Orientation;Attention;Following commands;Safety/judgement Orientation Level: Situation     Following Commands: Follows one step commands inconsistently Safety/Judgement: Decreased awareness of safety     General Comments: Pt repeatedly stating.  I cant do anything, I need someone to help me.       General Comments      Exercises        Assessment/Plan    PT Assessment Patient needs continued PT services  PT Diagnosis Difficulty walking;Altered mental status   PT Problem List Decreased strength;Decreased range of motion;Decreased activity tolerance;Decreased balance;Decreased mobility;Decreased cognition;Decreased knowledge of use of DME;Obesity;Decreased safety awareness  PT Treatment Interventions DME instruction;Gait training;Stair training;Functional mobility training;Therapeutic activities;Therapeutic exercise;Cognitive remediation;Patient/family education   PT Goals (Current goals can be found in the Care Plan section) Acute Rehab PT Goals Patient Stated Goal: Be able to walk to bathroom PT Goal Formulation: Patient unable to participate in goal setting Time For Goal Achievement: 05/05/14 Potential to Achieve Goals: Fair    Frequency Min 3X/week   Barriers to discharge Decreased caregiver support Spouse ltd in ability to assist    Co-evaluation               End of Session Equipment Utilized During Treatment: Gait belt Activity Tolerance: Patient limited by fatigue;Other (comment) (confusion) Patient left: in bed;with call bell/phone within reach Nurse Communication: Mobility status         Time: [id number] PT Time Calculation (min) (ACUTE ONLY): 15 min   Charges:   PT Evaluation $Initial PT Evaluation Tier I: 1 Procedure     PT G Codes:        [name] 04/21/2014, 12:48 PM

## 2014-04-22 LAB — BASIC METABOLIC PANEL
Anion gap: 5 (ref 5–15)
BUN: 18 mg/dL (ref 6–23)
CO2: 23 mmol/L (ref 19–32)
CREATININE: 1.53 mg/dL — AB (ref 0.50–1.10)
Calcium: 8.1 mg/dL — ABNORMAL LOW (ref 8.4–10.5)
Chloride: 116 mEq/L — ABNORMAL HIGH (ref 96–112)
GFR calc non Af Amer: 34 mL/min — ABNORMAL LOW (ref >90)
GFR, EST AFRICAN AMERICAN: 39 mL/min — AB (ref >90)
Glucose, Bld: 141 mg/dL — ABNORMAL HIGH (ref 70–99)
Potassium: 3 mmol/L — ABNORMAL LOW (ref 3.5–5.1)
Sodium: 144 mmol/L (ref 135–145)

## 2014-04-22 LAB — CBC
HEMATOCRIT: 34.4 % — AB (ref 36.0–46.0)
Hemoglobin: 11.4 g/dL — ABNORMAL LOW (ref 12.0–15.0)
MCH: 29.8 pg (ref 26.0–34.0)
MCHC: 33.1 g/dL (ref 30.0–36.0)
MCV: 89.8 fL (ref 78.0–100.0)
Platelets: 229 10*3/uL (ref 150–400)
RBC: 3.83 MIL/uL — ABNORMAL LOW (ref 3.87–5.11)
RDW: 14.4 % (ref 11.5–15.5)
WBC: 10.8 10*3/uL — ABNORMAL HIGH (ref 4.0–10.5)

## 2014-04-22 LAB — CK: Total CK: 96 U/L (ref 7–177)

## 2014-04-22 LAB — GLUCOSE, CAPILLARY
Glucose-Capillary: 138 mg/dL — ABNORMAL HIGH (ref 70–99)
Glucose-Capillary: 158 mg/dL — ABNORMAL HIGH (ref 70–99)
Glucose-Capillary: 152 mg/dL — ABNORMAL HIGH (ref 70–99)
Glucose-Capillary: 185 mg/dL — ABNORMAL HIGH (ref 70–99)

## 2014-04-22 MED ORDER — METOPROLOL TARTRATE 1 MG/ML IV SOLN
5.0000 mg | Freq: Once | INTRAVENOUS | Status: AC
  Administered 2014-04-22: 5 mg via INTRAVENOUS
  Filled 2014-04-22: qty 5

## 2014-04-22 MED ORDER — ASPIRIN 81 MG PO CHEW
81.0000 mg | CHEWABLE_TABLET | Freq: Every day | ORAL | Status: DC
  Administered 2014-04-22 – 2014-04-25 (×2): 81 mg via ORAL
  Filled 2014-04-22 (×4): qty 1

## 2014-04-22 MED ORDER — METOPROLOL TARTRATE 50 MG PO TABS
50.0000 mg | ORAL_TABLET | Freq: Two times a day (BID) | ORAL | Status: DC
  Administered 2014-04-22: 50 mg via ORAL
  Filled 2014-04-22 (×3): qty 1

## 2014-04-22 MED ORDER — POTASSIUM CHLORIDE CRYS ER 20 MEQ PO TBCR
40.0000 meq | EXTENDED_RELEASE_TABLET | Freq: Once | ORAL | Status: DC
  Filled 2014-04-22: qty 2

## 2014-04-22 NOTE — Progress Notes (Signed)
Patient refused her PO potassium today. Dr. Elisabeth Pigeonevine made aware. Will continue to monitor patient.

## 2014-04-22 NOTE — Progress Notes (Addendum)
Patient ID: Natalie Renshawlma C Menchaca, female   DOB: 07/05/1945, 68 y.o.   MRN: [id number] [facility] PROGRESS NOTE  [name] WUJ:[id number] DOB: 12/04/1945 DOA: 04/17/2014 PCP: No primary care provider on file.  Brief narrative:    68 y.o. female with a PMH of thyroid disease, not on home medications, who was admitted 04/17/14 after becoming weak and falling at home. There was no loss of consciousness. Family reports generalized failure to thrive over the past 3-4 years. Upon initial evaluation in the ED, the patient was found to have a potassium of 2.4, a glucose of 953, a creatinine of 1.92, WBC of 29.2, CK 1917, proBNP 7693.0 and a lactic acid of 8.74. She was admitted for evaluation and treatment of sepsis, HONK, and rhabdomyolysis.   Hospital course is complicated with development of atrial fibrillation with rapid ventricular rate, patient was started on heparin drip and cardiology has seen the patient in consultation. Patient has converted to sinus rhythm and per cardiology we stopped heparin drip 04/20/2014.   Assessment/Plan:     Principal Problem:  Sepsis secondary to community-acquired pneumonia with lactic acidosis and leukocytosis  Based on CT scan, right lower lobe pneumonia. Chest x-ray did not show significant infiltrates. Urinalysis did not reveal UTI.  Urine culture shows no growth.  Patient was initially on empiric antibiotics, Zosyn and vancomycin but this was narrowed down to Levaquin.  Leukocytosis improved.   Active Problems:   New atrial fibrillation  Appreciate cardiology consult and recommendations. Patient was on heparin drip but stopped 04/20/2014 because patient converted to sinus rhythm.  Continue beta blockers, she is currently on metoprolol 25 mg PO BID   Hyperosmolar non-ketotic state in patient with type 2 diabetes mellitus / high anion gap metabolic acidosis  Anion gap 25 on admission. Based on BMP 04/19/2014, anion gap is within normal limits. CO2  is 27.  Current insulin regimen includes Lantus 10 units at bedtime along with NovoLog 4 units 3 times daily with meals and sliding scale insulin.   Elevated brain natriuretic peptide (BNP) level / elevated troponin  2-D echocardiogram shows preserved ejection fraction.  Troponin elevation likely from demand ischemia. Troponin level trended down, last 2 sets: 0.27 --> 0.21  Pt initially on heparin but this stopped 04/20/2014.Marland Kitchen.  Continue beta blockers.   Yeast dermatitis  Lamisil cream ordered.   History of thyroid disease  TSH is within normal limits.   Hypokalemia  Magnesium level within normal limits. Potassium being supplemented.   Rhabdomyolysis  Continue low rate IV fluids because of high BNP level.  CPK down from 1760 to 747.      AKI (acute kidney injury)  Likely secondary to sepsis and rhabdomyolysis versus possible chronic kidney disease but we do not have previous values for comparison.  Creatinine has improved with IV fluids.   Altered mental status / metabolic encephalopathy  Unclear etiology. Patient is better this morning, she is oriented to time, place and person.   DVT Prophylaxis  Heparin subcutaneous for DVT prophylaxis.  Code Status: Full. Family Communication: No family at the bedside. Disposition Plan: home when stable.    IV access:   Peripheral IV  Procedures and diagnostic studies:     Ct Abdomen Pelvis Wo Contrast 04/18/2014: 1. Right lower lobe pneumonia noted; this likely explains the patient's symptoms. 2. Cholelithiasis; gallbladder otherwise unremarkable. 3. Small to moderate umbilical hernia, containing only fat. 4. Mildly enlarged fibroid uterus noted. Electronically Signed By: Roanna RaiderJeffery Chang M.D. On: 04/18/2014 05:07  Dg Chest 2 View 04/17/2014: Elevated right hemidiaphragm with associated right basilar atelectasis. No consolidation or significant edema. Electronically Signed By: Roxy HorsemanBill Veazey M.D. On:  04/17/2014 21:36   Dg Lumbar Spine 2-3 Views 04/17/2014: 1. No definite acute traumatic injury within the lumbar spine. 2. Anterior wedging of the L1 and L2 vertebral bodies, favored to be chronic in nature. Possible acute compression fractures are not entirely excluded. Correlation with site of pain recommended.   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 No fracture or dislocation. Electronically Signed By: Bretta BangWilliam Woodruff M.D. On: 04/17/2014 21:37   Dg Tibia/fibula Left 04/17/2014: No evidence of fracture or dislocation.   Medical Consultants:   Cardiology   Other Consultants:   None   IAnti-Infectives:    Vancomycin 04/17/14---> 04/18/14  Zosyn 04/17/14---> 04/18/14  Levaquin 04/18/14--->    Manson PasseyEVINE, Viveka, MD  [facility] Pager [phone]  If 7PM-7AM, please contact night-coverage [email or web address] Password [email or web address] 04/22/2014, 2:32 PM   LOS: 5 days    HPI/Subjective: No acute overnight events.  Objective: Filed Vitals:   04/21/14 1431 04/21/14 2201 04/22/14 0513 04/22/14 1126  BP: 140/85 136/61 132/84 131/75  Pulse: 79 88 101 82  Temp: 98.2 F (36.8 C) 97.4 F (36.3 C) 98.8 F (37.1 C)   TempSrc: Axillary Oral Oral   Resp: 22 20 20    Height:      Weight:      SpO2: 99% 100% 96%     Intake/Output Summary (Last 24 hours) at 04/22/14 1432 Last data filed at 04/22/14 0955  Gross per 24 hour  Intake   1510 ml  Output   2200 ml  Net   -690 ml    Exam:   General:  Pt is alert, follows commands appropriately, not in acute distress  Cardiovascular: Regular rate and rhythm, S1/S2 appreciated   Respiratory: Clear to auscultation bilaterally, no wheezing, no crackles, no rhonchi   Data Reviewed: Basic Metabolic Panel:  Recent Labs Lab 04/18/14 0047  04/18/14 1319 04/18/14 1500 04/18/14 2123 04/19/14 0359 04/20/14 0150 04/22/14 0533  NA  138  < > 145 145  --  142 144 144  K 2.1*  < > 2.8* 3.0* 3.4* 3.9 3.1* 3.0*  CL 98  < > 109 108  --  111 115* 116*  CO2 27  < > 24 25  --  22 24 23   GLUCOSE 822*  < > 116* 131*  --  198* 218* 141*  BUN 21  < > 23 23  --  27* 24* 18  CREATININE 2.58*  < > 2.73* 2.64*  --  2.67* 2.06* 1.53*  CALCIUM 8.0*  < > 7.9* 7.7*  --  7.3* 7.7* 8.1*  MG 1.7  --   --   --   --  1.8  --   --   < > = values in this interval not displayed. Liver Function Tests:  Recent Labs Lab 04/17/14 2033  AST 44*  ALT 17  ALKPHOS 155*  BILITOT 1.1  PROT 7.5  ALBUMIN 2.6*    Recent Labs Lab 04/18/14 0047  LIPASE 29    Recent Labs Lab 04/18/14 0049  AMMONIA 35*   CBC:  Recent Labs Lab 04/17/14 2033 04/18/14 0209 04/19/14 0359 04/20/14 0150 04/22/14 0533  WBC 29.2* 23.5* 21.0* 12.7* 10.8*  NEUTROABS 26.8*  --   --   --   --  HGB 15.2* 13.3 11.8* 11.1* 11.4*  HCT 46.8* 39.3 34.8* 33.5* 34.4*  MCV 92.9 89.1 88.5 90.1 89.8  PLT 485* 306 246 235 229   Cardiac Enzymes:  Recent Labs Lab 04/17/14 2033 04/18/14 0017 04/18/14 0900 04/18/14 1500 04/18/14 2123 04/19/14 0359 04/20/14 0150 04/22/14 0533  CKTOTAL 1917*  --   --   --   --  1760* 747* 96  TROPONINI <0.30 0.18* 0.27* 0.28* 0.21*  --   --   --    BNP: Invalid input(s): POCBNP CBG:  Recent Labs Lab 04/21/14 1226 04/21/14 1635 04/21/14 2149 04/22/14 0743 04/22/14 1210  GLUCAP 189* 161* 112* 138* 158*    Recent Results (from the past 240 hour(s))  Urine culture     Status: None   Collection Time: 04/17/14  8:47 PM  Result Value Ref Range Status   Specimen Description URINE, CATHETERIZED  Final   Special Requests Normal  Final   Culture  Setup Time   Final    04/18/2014 04:23 Performed at [facility]    Colony Count NO GROWTH Performed at [facility]   Final   Culture NO GROWTH Performed at [facility]   Final   Report Status 04/19/2014 FINAL  Final  MRSA PCR Screening     Status:  None   Collection Time: 04/18/14  3:55 AM  Result Value Ref Range Status   MRSA by PCR NEGATIVE NEGATIVE Final    Comment:        The GeneXpert MRSA Assay (FDA approved for NASAL specimens only), is one component of a comprehensive MRSA colonization surveillance program. It is not intended to diagnose MRSA infection nor to guide or monitor treatment for MRSA infections.      Scheduled Meds: . antiseptic oral rinse  7 mL Mouth Rinse q12n4p  . aspirin  81 mg Oral Daily  . chlorhexidine  15 mL Mouth Rinse BID  . heparin subcutaneous  5,000 Units Subcutaneous 3 times per day  . insulin aspart  0-15 Units Subcutaneous TID WC  . insulin aspart  0-5 Units Subcutaneous QHS  . insulin aspart  4 Units Subcutaneous TID WC  . insulin glargine  10 Units Subcutaneous QHS  . levofloxacin (LEVAQUIN) IV  750 mg Intravenous Q48H  . magic mouthwash w/lidocaine  2 mL Oral QID  . metoprolol tartrate  50 mg Oral BID  . potassium chloride  40 mEq Oral Once  . sodium chloride  3 mL Intravenous Q12H  . terbinafine   Topical BID   Continuous Infusions: . sodium chloride 50 mL/hr at 04/22/14 0457

## 2014-04-22 NOTE — Progress Notes (Signed)
ANTIBIOTIC CONSULT NOTE - Follow-up  Pharmacy Consult for Levofloxacin Indication: Sepsis due to CAP  No Known Allergies  Patient Measurements: Height: 5\' 7"  (170.2 cm) Weight: 252 lb 13.9 oz (114.7 kg) IBW/kg (Calculated) : 61.6  Vital Signs: Temp: 98.8 F (37.1 C) (12/26 0513) Temp Source: Oral (12/26 0513) BP: 132/84 mmHg (12/26 0513) Pulse Rate: 101 (12/26 0513) Intake/Output from previous day: 12/25 0701 - 12/26 0700 In: 1510 [P.O.:360; I.V.:1000; IV Piggyback:150] Out: 1850 [Urine:1850] Intake/Output from this shift: Total I/O In: -  Out: 350 [Urine:350]  Labs:  Recent Labs  04/20/14 0150 04/22/14 0533  WBC 12.7* 10.8*  HGB 11.1* 11.4*  PLT 235 229  CREATININE 2.06* 1.53*   Estimated Creatinine Clearance: 45.4 mL/min (by C-G formula based on Cr of 1.53). No results for input(s): VANCOTROUGH, VANCOPEAK, VANCORANDOM, GENTTROUGH, GENTPEAK, GENTRANDOM, TOBRATROUGH, TOBRAPEAK, TOBRARND, AMIKACINPEAK, AMIKACINTROU, AMIKACIN in the last 72 hours.   Microbiology: Recent Results (from the past 720 hour(s))  Urine culture     Status: None   Collection Time: 04/17/14  8:47 PM  Result Value Ref Range Status   Specimen Description URINE, CATHETERIZED  Final   Special Requests Normal  Final   Culture  Setup Time   Final    04[facility]18/2014 04:23 [facility]    Colony Count NO GROWTH [facility]   Final   Culture NO GROWTH [facility]   Final   Report Status 04/19/2014 FINAL  Final  MRSA PCR Screening     Status: None   Collection Time: 04/18/14  3:55 AM  Result Value Ref Range Status   MRSA by PCR NEGATIVE NEGATIVE Final    Comment:        The GeneXpert MRSA Assay (FDA approved for NASAL specimens only), is one component of a comprehensive MRSA colonization surveillance program. It is not intended to diagnose MRSA infection nor to guide or monitor treatment for MRSA infections.     Medications:  Scheduled:  . antiseptic oral rinse  7 mL Mouth Rinse q12n4p  . aspirin  81 mg Oral Daily  . chlorhexidine  15 mL Mouth Rinse BID  . heparin subcutaneous  5,000 Units Subcutaneous 3 times per day  . insulin aspart  0-15 Units Subcutaneous TID WC  . insulin aspart  0-5 Units Subcutaneous QHS  . insulin aspart  4 Units Subcutaneous TID WC  . insulin glargine  10 Units Subcutaneous QHS  . levofloxacin (LEVAQUIN) IV  750 mg Intravenous Q48H  . magic mouthwash w/lidocaine  2 mL Oral QID  . metoprolol tartrate  50 mg Oral BID  . potassium chloride  40 mEq Oral Once  . sodium chloride  3 mL Intravenous Q12H  . terbinafine   Topical BID   Assessment: 68 yoF c/o weakness/fall at home.  CBG=953 on admission.  Started on Zosyn and Vancomycin per Rx for Sepsis.  CT shows RLL pna, CXR w/o significant infiltrates.  UA not c/w UTI.  MD would like to narrow antibiotics to levofloxacin.  12/21 >> zosyn >> 12/22 12/21 >> vancomycin >> 12/22 12/22 >> levofloxacin >>   Tmax: improved to AF WBCs: 29.2-> 10.8K Renal: AKI resolving: SCr 1.53, Cl ~ 39N  12/21 blood: no growth 12/21 urine: NGF 12/21 MRSA swab: negative  Drug level / dose changes info: 12/21: D0 Zosyn 3.375 gm IV q8h EI and Vancomycin 1000 + 1500 mg x 1, then 1500 mg IV q24h for Sepsis.  12/22: switched  to levofloxacin 500 mg IV x 1, then 250 mg IV q24 12/23: switched to levofloxacin 750 mg IV q48  Goal of Therapy:  Eradication of infection Appropriate dosing of antibiotics for renal function and indication  Plan:   Levofloxacin 750 mg IV q48-currrently D5 Levaquin, D7 abx  F/u clinical course, SCr, cultures as needed  F/u LOT, opportunity for IV-PO conversion  Otho BellowsGreen, Jiaire Rosebrook L PharmD Pager [phone] 04/22/2014, 10:16 AM

## 2014-04-22 NOTE — Progress Notes (Signed)
    Subjective:  Remains very confused; Denies CP or dyspnea   Objective:  Filed Vitals:   04/21/14 0550 04/21/14 1431 04/21/14 2201 04/22/14 0513  BP: 145/77 140/85 136/61 132/84  Pulse: 119 79 88 101  Temp: 97.7 F (36.5 C) 98.2 F (36.8 C) 97.4 F (36.3 C) 98.8 F (37.1 C)  TempSrc: Oral Axillary Oral Oral  Resp: 24 22 20 20   Height:      Weight:      SpO2: 100% 99% 100% 96%    Intake/Output from previous day:  Intake/Output Summary (Last 24 hours) at 04/22/14 0732 Last data filed at 04/22/14 0546  Gross per 24 hour  Intake   1510 ml  Output   1850 ml  Net   -340 ml    Physical Exam: Physical exam: Well-developed obese in no acute distress.  Skin is warm and dry.  HEENT is normal.  Neck is supple.  Chest is clear to auscultation with normal expansion anteriorly Cardiovascular exam is irregular Abdominal exam nontender or distended. No masses palpated. Extremities show no edema. neuro grossly intact    Lab Results: Basic Metabolic Panel:  Recent Labs  16/01/9611/24/15 0150 04/22/14 0533  NA 144 144  K 3.1* 3.0*  CL 115* 116*  CO2 24 23  GLUCOSE 218* 141*  BUN 24* 18  CREATININE 2.06* 1.53*  CALCIUM 7.7* 8.1*   CBC:  Recent Labs  04/20/14 0150 04/22/14 0533  WBC 12.7* 10.8*  HGB 11.1* 11.4*  HCT 33.5* 34.4*  MCV 90.1 89.8  PLT 235 229   Cardiac Enzymes:  Recent Labs  04/20/14 0150 04/22/14 0533  CKTOTAL 747* 96     Assessment/Plan:  1 paroxysmal atrial fibrillation-this is felt secondary to the hyperadrenergic state associated with her infection. She remains in sinus with frequent PACs and brief runs of PAT. Increase metoprolol to 50 mg by mouth twice a day. Add aspirin. Plan to initiate anticoagulation once her mental status improves. 2 elevated troponin-felt secondary to atrial fibrillation, pneumonia, rhabdomyolysis and renal insufficiency. Would favor nuclear study when she recovers from her present illness. This could be performed  as an outpatient. 3 pneumonia-continue antibiotics per primary care. 4 confusion-etiology unclear. Further evaluation per primary care. 5 acute on chronic stage III renal failure-renal function slowly improving. Patient will need close follow-up with Dr. Allyson SabalBerry following discharge. Natalie MillersBrian [name] 04/22/2014, 7:32 AM

## 2014-04-22 NOTE — Progress Notes (Addendum)
Patient ID: [name], female   DOB: November 29, 1945, 68 y.o.   MRN: [id number] [facility] PROGRESS NOTE  [name] WUJ:[id number] DOB: Jun 09, 1945 DOA: 04/17/2014 PCP: No primary care provider on file.  Brief narrative:    68 y.o. female with a PMH of thyroid disease, not on home medications, who was admitted 04/17/14 after becoming weak and falling at home. There was no loss of consciousness. Family reports generalized failure to thrive over the past 3-4 years. Upon initial evaluation in the ED, the patient was found to have a potassium of 2.4, a glucose of 953, a creatinine of 1.92, WBC of 29.2, CK 1917, proBNP 7693.0 and a lactic acid of 8.74. She was admitted for evaluation and treatment of sepsis, HONK, and rhabdomyolysis.   Hospital course is complicated with development of atrial fibrillation with rapid ventricular rate, patient was started on heparin drip and cardiology has seen the patient in consultation. Patient has converted to sinus rhythm and per cardiology we stopped heparin drip 04/20/2014. She will likely need nuclear study once acute infection resolves.   Assessment/Plan:    Principal Problem:  Sepsis secondary to community-acquired pneumonia with lactic acidosis and leukocytosis  Based on CT scan, right lower lobe pneumonia. Chest x-ray did not show significant infiltrates. Urinalysis did not reveal UTI.   Looks likely blood cultures not collected on admission. In EPIC still pending. Urine culture shows no growth.  Patient was initially on empiric antibiotics, Zosyn and vancomycin but this was narrowed down to Levaquin.  Leukocytosis is improving, 21 --> 12.7 --> 10.8   Active Problems:   New atrial fibrillation  Appreciate cardiology consult and recommendations. Patient was on heparin drip but stopped 04/20/2014 because patient converted to sinus rhythm.  Continue beta blockers. She had few PAC, PAT. Metoprolol increased to 50 mg BID per cardio.  Patient  will nee nuclear study done once acute infection clears.    Hyperosmolar non-ketotic state in patient with type 2 diabetes mellitus / high anion gap metabolic acidosis  Anion gap 25 on admission. Based on BMP 04/19/2014, anion gap is within normal limits. CO2 is 27.  Current insulin regimen includes Lantus 10 units at bedtime along with NovoLog 4 units 3 times daily with meals and sliding scale insulin.   Elevated brain natriuretic peptide (BNP) level / elevated troponin  2-D echocardiogram shows preserved ejection fraction.  Troponin elevation likely from demand ischemia. Troponin level trended down, last 2 sets: 0.27 --> 0.21  Pt initially on heparin but this was stopped 04/20/2014.Marland Kitchen  Continue beta blockers as indicated above metoprolol 50 gm PO BID. Will follow up with cardio if plan for nuclear study sometimes next week.  Yeast dermatitis  Lamisil cream ordered.   History of thyroid disease  TSH is within normal limits.   Hypokalemia  Magnesium level within normal limits. Potassium being supplemented.   Rhabdomyolysis  Continue low rate IV fluids because of high BNP level.  CPK down from 1760 to 747 to WNL.     AKI (acute kidney injury)  Likely secondary to sepsis and rhabdomyolysis versus possible chronic kidney disease but we do not have previous values for comparison.  Creatinine has improved with IV fluids from 2.73 to 1.53.   Altered mental status / metabolic encephalopathy  Unclear etiology. Continue to monitor mental status.    DVT Prophylaxis  Heparin subcutaneous for DVT prophylaxis.  Code Status: Full. Family Communication: No family at the bedside. Disposition Plan: home when stable. Needs PT evaluation.  IV access:   Peripheral IV  Procedures and diagnostic studies:     Ct Abdomen Pelvis Wo Contrast 04/18/2014: 1. Right lower lobe pneumonia noted; this likely explains the patient's symptoms. 2. Cholelithiasis; gallbladder  otherwise unremarkable. 3. Small to moderate umbilical hernia, containing only fat. 4. Mildly enlarged fibroid uterus noted. Electronically Signed By: Roanna RaiderJeffery Chang M.D. On: 04/18/2014 05:07   Dg Chest 2 View 04/17/2014: Elevated right hemidiaphragm with associated right basilar atelectasis. No consolidation or significant edema. Electronically Signed By: Roxy HorsemanBill Veazey M.D. On: 04/17/2014 21:36   Dg Lumbar Spine 2-3 Views 04/17/2014: 1. No definite acute traumatic injury within the lumbar spine. 2. Anterior wedging of the L1 and L2 vertebral bodies, favored to be chronic in nature. Possible acute compression fractures are not entirely excluded. Correlation with site of pain recommended.   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 No fracture or dislocation. Electronically Signed By: Bretta BangWilliam Woodruff M.D. On: 04/17/2014 21:37   Dg Tibia/fibula Left 04/17/2014: No evidence of fracture or dislocation.   Medical Consultants:   Cardiology   Other Consultants:   None   IAnti-Infectives:    Vancomycin 04/17/14---> 04/18/14  Zosyn 04/17/14---> 04/18/14  Levaquin 04/18/14--->    Manson PasseyEVINE, Rafia, MD  [facility] Pager [phone]  If 7PM-7AM, please contact night-coverage [email or web address] Password [email or web address] 04/22/2014, 2:34 PM   LOS: 5 days    HPI/Subjective: No acute overnight events.  Objective: Filed Vitals:   04/21/14 1431 04/21/14 2201 04/22/14 0513 04/22/14 1126  BP: 140/85 136/61 132/84 131/75  Pulse: 79 88 101 82  Temp: 98.2 F (36.8 C) 97.4 F (36.3 C) 98.8 F (37.1 C)   TempSrc: Axillary Oral Oral   Resp: 22 20 20    Height:      Weight:      SpO2: 99% 100% 96%     Intake/Output Summary (Last 24 hours) at 04/22/14 1434 Last data filed at 04/22/14 0955  Gross per 24 hour  Intake   1390 ml  Output   1650 ml  Net   -260 ml    Exam:   General:   Pt is not in distress  Cardiovascular: S1/S2 appreciated   Respiratory: no wheezing, no crackles, no rhonchi  Abdomen: Soft, non tender, non distended, bowel sounds present  Extremities: pulses DP and PT palpable bilaterally  Neuro: Grossly nonfocal  Data Reviewed: Basic Metabolic Panel:  Recent Labs Lab 04/18/14 0047  04/18/14 1319 04/18/14 1500 04/18/14 2123 04/19/14 0359 04/20/14 0150 04/22/14 0533  NA 138  < > 145 145  --  142 144 144  K 2.1*  < > 2.8* 3.0* 3.4* 3.9 3.1* 3.0*  CL 98  < > 109 108  --  111 115* 116*  CO2 27  < > 24 25  --  22 24 23   GLUCOSE 822*  < > 116* 131*  --  198* 218* 141*  BUN 21  < > 23 23  --  27* 24* 18  CREATININE 2.58*  < > 2.73* 2.64*  --  2.67* 2.06* 1.53*  CALCIUM 8.0*  < > 7.9* 7.7*  --  7.3* 7.7* 8.1*  MG 1.7  --   --   --   --  1.8  --   --   < > = values in this interval not displayed. Liver Function Tests:  Recent Labs Lab 04/17/14 2033  AST 44*  ALT 17  ALKPHOS  155*  BILITOT 1.1  PROT 7.5  ALBUMIN 2.6*    Recent Labs Lab 04/18/14 0047  LIPASE 29    Recent Labs Lab 04/18/14 0049  AMMONIA 35*   CBC:  Recent Labs Lab 04/17/14 2033 04/18/14 0209 04/19/14 0359 04/20/14 0150 04/22/14 0533  WBC 29.2* 23.5* 21.0* 12.7* 10.8*  NEUTROABS 26.8*  --   --   --   --   HGB 15.2* 13.3 11.8* 11.1* 11.4*  HCT 46.8* 39.3 34.8* 33.5* 34.4*  MCV 92.9 89.1 88.5 90.1 89.8  PLT 485* 306 246 235 229   Cardiac Enzymes:  Recent Labs Lab 04/17/14 2033 04/18/14 0017 04/18/14 0900 04/18/14 1500 04/18/14 2123 04/19/14 0359 04/20/14 0150 04/22/14 0533  CKTOTAL 1917*  --   --   --   --  1760* 747* 96  TROPONINI <0.30 0.18* 0.27* 0.28* 0.21*  --   --   --    BNP: Invalid input(s): POCBNP CBG:  Recent Labs Lab 04/21/14 1226 04/21/14 1635 04/21/14 2149 04/22/14 0743 04/22/14 1210  GLUCAP 189* 161* 112* 138* 158*    Recent Results (from the past 240 hour(s))  Urine culture     Status: None   Collection Time:  04/17/14  8:47 PM  Result Value Ref Range Status   Specimen Description URINE, CATHETERIZED  Final   Special Requests Normal  Final   Culture  Setup Time   Final    04/18/2014 04:23 [facility]    Colony Count NO GROWTH Performed at [facility]   Final   Culture NO GROWTH Performed at [facility]   Final   Report Status 04/19/2014 FINAL  Final  MRSA PCR Screening     Status: None   Collection Time: 04/18/14  3:55 AM  Result Value Ref Range Status   MRSA by PCR NEGATIVE NEGATIVE Final    Comment:        The GeneXpert MRSA Assay (FDA approved for NASAL specimens only), is one component of a comprehensive MRSA colonization surveillance program. It is not intended to diagnose MRSA infection nor to guide or monitor treatment for MRSA infections.      Scheduled Meds: . antiseptic oral rinse  7 mL Mouth Rinse q12n4p  . aspirin  81 mg Oral Daily  . chlorhexidine  15 mL Mouth Rinse BID  . heparin subcutaneous  5,000 Units Subcutaneous 3 times per day  . insulin aspart  0-15 Units Subcutaneous TID WC  . insulin aspart  0-5 Units Subcutaneous QHS  . insulin aspart  4 Units Subcutaneous TID WC  . insulin glargine  10 Units Subcutaneous QHS  . levofloxacin (LEVAQUIN) IV  750 mg Intravenous Q48H  . magic mouthwash w/lidocaine  2 mL Oral QID  . metoprolol tartrate  50 mg Oral BID  . potassium chloride  40 mEq Oral Once  . sodium chloride  3 mL Intravenous Q12H  . terbinafine   Topical BID   Continuous Infusions: . sodium chloride 50 mL/hr at 04/22/14 0457

## 2014-04-23 LAB — CLOSTRIDIUM DIFFICILE BY PCR: CDIFFPCR: NEGATIVE

## 2014-04-23 LAB — GLUCOSE, CAPILLARY
GLUCOSE-CAPILLARY: 172 mg/dL — AB (ref 70–99)
Glucose-Capillary: 169 mg/dL — ABNORMAL HIGH (ref 70–99)
Glucose-Capillary: 139 mg/dL — ABNORMAL HIGH (ref 70–99)

## 2014-04-23 MED ORDER — METOPROLOL TARTRATE 1 MG/ML IV SOLN
5.0000 mg | Freq: Four times a day (QID) | INTRAVENOUS | Status: DC
  Administered 2014-04-23 – 2014-04-25 (×8): 5 mg via INTRAVENOUS
  Filled 2014-04-23 (×8): qty 5

## 2014-04-23 NOTE — Progress Notes (Signed)
PT Cancellation Note  Patient Details Name: [name] MRN: [id number] DOB: 04/26/1946   Cancelled Treatment:     PT attempted but deferred at request of RN 2* pts ongoing confusion and bouts of diarrhea.  Will follow.   [name] 04/23/2014, 12:12 PM

## 2014-04-23 NOTE — Clinical Social Work Psychosocial (Signed)
Clinical Social Work Department BRIEF PSYCHOSOCIAL ASSESSMENT 04/23/2014  Patient:  [name], [name]     Account Number:  [id number]     Admit date:  04/17/2014  Clinical Social Worker:  Earlie Server  Date/Time:  04/20/2014 12:30 PM  Referred by:  Physician  Date Referred:  04/20/2014 Referred for  SNF Placement  Psychosocial assessment   Other Referral:   Interview type:  Patient Other interview type:    PSYCHOSOCIAL DATA Living Status:  FAMILY Admitted from facility:   Level of care:   Primary support name:  Deidre Ala Primary support relationship to patient:  SPOUSE Degree of support available:   Adequate    CURRENT CONCERNS Current Concerns  Post-Acute Placement   Other Concerns:    SOCIAL WORK ASSESSMENT / PLAN CSW received referral to assist with DC planning. CSW reviewed chart and spoke with bedside RN who reports concerns about possible self-neglect. CSW reviewed chart which stated PT has been ordered to evaluate patient. CSW met with patient and sister [name]) at bedside. CSW introduced myself and explained role.    Patient lives at home with husband. 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 Husband is often gone during the day and patient stays alone. Patient had a dtr who passed away but has a brother and sister who is supportive.    Patient reports she has been weak at the hospital and unable to get out of bed. Patient reports she was independent at home prior to admission and plans to return home at DC. CSW explained that MD has ordered PT but voiced concern about patient returning home if patient is not stable and does not have 24 hour support. Patient reports she would never go to a SNF and does not want any information. CSW explained process and Medicare coverage for SNF but patient still refusing and reports she will return home as soon as possible.    Sister stepped outside of room and spoke with CSW in the hallway. Sister  reports she does not feel comfortable speaking in front of patient but has concerns about her returning home. Patient usually stays in the garage because she cannot get around the house well. Husband wants patient to return home as well to assist with the business. Sister reports patient has a social issue and does not allow people into her home and does not allow others to help her. Sister reports that patient and husband have the finances to pay for help but will not reach out for help. Sister understanding that patient cannot be forced to go to SNF against her will even if it seems like it is the best decision for her. Sister understanding and reports she and brother will try and talk to patient as well.    CSW will continue to follow after PT evaluates but patient refusing SNF even if PT recommends placement at this time.   Assessment/plan status:  Psychosocial Support/Ongoing Assessment of Needs Other assessment/ plan:   Information/referral to community resources:   SNF information    PATIENT'S/FAMILY'S RESPONSE TO PLAN OF CARE: Patient alert and oriented. Patient has flat affect and guarded when discussing SNF. No concerns for abuse presented but sister  believes that patient is neglecting her wellbeing. Patient reports no needs from CSW and is hoping to DC soon. Patient reports no safety concerns with returning home and that family can assist as needed. Sister thanked CSW for speaking with her but is aware that patient has ability  to make her own decisions.   Dede Query, [place]) for Sindy Messing LCSW [facility] Social Worker - Weekend Coverage cell #: [phone]

## 2014-04-23 NOTE — Consult Note (Addendum)
WOC assistance requested for use of Interdry.  Discussed patient with bedside nurse who is a WTA.  She describes skin folds beneath abd and groin as being red, macerated, and moist with partial thickness skin loss.  Appearance is consistent with intertrigo.  Interdry silver-impregnated fabric ordered for use by bedside nurses and instructions provided.  This product should remain in place for 5 days for optimal plan of care to provide antimicrobial benefits and wick moisture away from skin.  Please re-consult if further assistance is needed.  Thank-you,  Cammie Mcgeeawn Sherrie Marsan MSN, RN, CWOCN, [place]-AP, CNS [phone]

## 2014-04-23 NOTE — Clinical Social Work Note (Signed)
CSW met with pt at bedside to discuss SNF placement   CSW prompted a discussion about pt's history but pt's answers did not make much sense to the questions asked of her.  Pt was able to answer that she did not want to go to a facility and that yes she wanted to go home with help  CSW called and spoke with Pt's husband and relayed information about SNF facilities  Pt's husband stated that pt was making sense until she was transferred to the 4th floor.  Pt's husband stated that pt did not even know that she had pneumonia or "sugar diabetes" until she fell to the floor las week.  He states that she was on the floor for 5 hours until he came home and found her.  He couldn't get her up and had to call paramedics  CSW discussed the importance of pt not being left alone and a facility could help her 24/7 when she leaves the hospital   Pt's husband stated that he "wanted to take her home"   Husband states that he owns his own business and has for 54 years so he is his own boss and can leave whenever he wants.  Pt's husband also states that pt's sisters would be willing to stay with her during the day when he has to go to work.  He agreed that she would not be left alone  CSW will continue to follow up until discharge.  Dede Query, LCSW [place] Worker - Weekend Coverage cell #: [phone]

## 2014-04-23 NOTE — Progress Notes (Addendum)
Patient ID: [name], female   DOB: 12/01/1945, 68 y.o.   MRN: [id number] [facility] PROGRESS NOTE  [name] WUJ:[id number] DOB: 06/26/1945 DOA: 04/17/2014 PCP: No primary care provider on file.  Brief narrative:    68 y.o. female with a PMH of thyroid disease, not on home medications, who was admitted 04/17/14 after becoming weak and falling at home. There was no loss of consciousness. Family reports generalized failure to thrive over the past 3-4 years. Upon initial evaluation in the ED, the patient was found to have a potassium of 2.4, a glucose of 953, a creatinine of 1.92, WBC of 29.2, CK 1917, proBNP 7693.0 and a lactic acid of 8.74. She was admitted for evaluation and treatment of sepsis, HONK, and rhabdomyolysis.   Hospital course is complicated with development of atrial fibrillation with rapid ventricular rate, patient was started on heparin drip and cardiology has seen the patient in consultation. Patient has converted to sinus rhythm and per cardiology we stopped heparin drip 04/20/2014. She will likely need nuclear study once acute infection resolves.  Assessment/Plan:    Principal Problem:  Sepsis secondary to community-acquired pneumonia with lactic acidosis and leukocytosis  Based on CT scan, right lower lobe pneumonia. Chest x-ray did not show significant infiltrates. Urinalysis did not reveal UTI.   Blood culture not collected on admission. Urine culture shows no growth.  Patient was initially on empiric antibiotics, Zosyn and vancomycin but this was narrowed down to Levaquin.  Leukocytosis is improving, 21 --> 12.7 --> 10.8   Active Problems:   New atrial fibrillation  Appreciate cardiology consult and recommendations. Patient was on heparin drip but stopped 04/20/2014 because patient converted to sinus rhythm.  Metoprolol increased to 50 mg BID per cardio. She however refused PO metoprolol so we switched to 5 mg IV every 6 hours for now.   Patient will  nee nuclear study done once acute infection clears. Likely outpatient.    Hyperosmolar non-ketotic state in patient with type 2 diabetes mellitus / high anion gap metabolic acidosis  Anion gap 25 on admission. Based on BMP 04/19/2014, anion gap is within normal limits. CO2 is 27.  Continue current insulin regimen Lantus 10 units at bedtime along with NovoLog 4 units 3 times daily with meals and sliding scale insulin.  CBG's in past 24 hours: 185, 172, 169.  Diarrhea  Rule out C.diff  Enteric precaution.    Elevated brain natriuretic peptide (BNP) level / elevated troponin  2-D echocardiogram shows preserved ejection fraction.  Troponin elevation likely from demand ischemia. Troponin level trended down, last 2 sets: 0.27 --> 0.21  Pt initially on heparin but this was stopped 04/20/2014.Marland Kitchen.  Continue beta blockers as indicated above.  Yeast dermatitis  Lamisil cream ordered.   History of thyroid disease  TSH is within normal limits.   Hypokalemia  Magnesium level within normal limits. Potassium being supplemented.   Rhabdomyolysis  Continue low rate IV fluids because of high BNP level.  CPK down from 1760 to 747 to WNL.     AKI (acute kidney injury)  Likely secondary to sepsis and rhabdomyolysis versus possible chronic kidney disease but we do not have previous values for comparison.  Creatinine has improved with IV fluids from 2.73 to 1.53.   Altered mental status / metabolic encephalopathy  Unclear etiology. Pt still confused, altered mental status.    DVT Prophylaxis  Heparin subcutaneous for DVT prophylaxis.  Code Status: Full. Family Communication: No family at the bedside. Disposition  Plan: home when stable. Needs PT evaluation.    IV access:   Peripheral IV  Procedures and diagnostic studies:     Ct Abdomen Pelvis Wo Contrast 04/18/2014: 1. Right lower lobe pneumonia noted; this likely explains the patient's symptoms. 2.  Cholelithiasis; gallbladder otherwise unremarkable. 3. Small to moderate umbilical hernia, containing only fat. 4. Mildly enlarged fibroid uterus noted. Electronically Signed By: Roanna Raider M.D. On: 04/18/2014 05:07   Dg Chest 2 View 04/17/2014: Elevated right hemidiaphragm with associated right basilar atelectasis. No consolidation or significant edema. Electronically Signed By: Roxy Horseman M.D. On: 04/17/2014 21:36   Dg Lumbar Spine 2-3 Views 04/17/2014: 1. No definite acute traumatic injury within the lumbar spine. 2. Anterior wedging of the L1 and L2 vertebral bodies, favored to be chronic in nature. Possible acute compression fractures are not entirely excluded. Correlation with site of pain recommended.   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 No fracture or dislocation. Electronically Signed By: Bretta Bang M.D. On: 04/17/2014 21:37   Dg Tibia/fibula Left 04/17/2014: No evidence of fracture or dislocation.   Medical Consultants:   Cardiology   Other Consultants:   None   IAnti-Infectives:    Vancomycin 04/17/14---> 04/18/14  Zosyn 04/17/14---> 04/18/14  Levaquin 04/18/14--->   Manson Passey, MD  [facility] Pager [phone]  If 7PM-7AM, please contact night-coverage [email or web address] Password [facility] 04/23/2014, 2:47 PM   LOS: 6 days    HPI/Subjective: No acute overnight events. Has diarrhea.   Objective: Filed Vitals:   04/22/14 2103 04/22/14 2104 04/22/14 2329 04/23/14 0614  BP:  119/63 152/80 131/52  Pulse:  116 88 67  Temp: 99.1 F (37.3 C) 97.1 F (36.2 C)  98.1 F (36.7 C)  TempSrc: Axillary Oral  Axillary  Resp:  18  18  Height:      Weight:      SpO2:  97%  99%    Intake/Output Summary (Last 24 hours) at 04/23/14 1447 Last data filed at 04/23/14 1200  Gross per 24 hour  Intake 1098.67 ml  Output    900 ml  Net  198.67 ml    Exam:   General:  Pt is confused, no distress  Cardiovascular: Regular rate and rhythm, S1/S2, no murmurs  Respiratory: Clear to auscultation bilaterally, no wheezing, no crackles, no rhonchi  Abdomen: non distended, bowel sounds present  Extremities: pulses DP and PT palpable bilaterally  Neuro: Grossly nonfocal  Data Reviewed: Basic Metabolic Panel:  Recent Labs Lab 04/18/14 0047  04/18/14 1319 04/18/14 1500 04/18/14 2123 04/19/14 0359 04/20/14 0150 04/22/14 0533  NA 138  < > 145 145  --  142 144 144  K 2.1*  < > 2.8* 3.0* 3.4* 3.9 3.1* 3.0*  CL 98  < > 109 108  --  111 115* 116*  CO2 27  < > 24 25  --  22 24 23   GLUCOSE 822*  < > 116* 131*  --  198* 218* 141*  BUN 21  < > 23 23  --  27* 24* 18  CREATININE 2.58*  < > 2.73* 2.64*  --  2.67* 2.06* 1.53*  CALCIUM 8.0*  < > 7.9* 7.7*  --  7.3* 7.7* 8.1*  MG 1.7  --   --   --   --  1.8  --   --   < > = values in this interval not displayed. Liver Function Tests:  Recent Labs Lab 04/17/14 2033  AST 44*  ALT 17  ALKPHOS 155*  BILITOT 1.1  PROT 7.5  ALBUMIN 2.6*    Recent Labs Lab 04/18/14 0047  LIPASE 29    Recent Labs Lab 04/18/14 0049  AMMONIA 35*   CBC:  Recent Labs Lab 04/17/14 2033 04/18/14 0209 04/19/14 0359 04/20/14 0150 04/22/14 0533  WBC 29.2* 23.5* 21.0* 12.7* 10.8*  NEUTROABS 26.8*  --   --   --   --   HGB 15.2* 13.3 11.8* 11.1* 11.4*  HCT 46.8* 39.3 34.8* 33.5* 34.4*  MCV 92.9 89.1 88.5 90.1 89.8  PLT 485* 306 246 235 229   Cardiac Enzymes:  Recent Labs Lab 04/17/14 2033 04/18/14 0017 04/18/14 0900 04/18/14 1500 04/18/14 2123 04/19/14 0359 04/20/14 0150 04/22/14 0533  CKTOTAL 1917*  --   --   --   --  1760* 747* 96  TROPONINI <0.30 0.18* 0.27* 0.28* 0.21*  --   --   --    BNP: Invalid input(s): POCBNP CBG:  Recent Labs Lab 04/22/14 1210 04/22/14 1737 04/22/14 2108 04/23/14 0819 04/23/14 1213  GLUCAP 158* 152* 185* 172* 169*    Urine  culture     Status: None   Collection Time: 04/17/14  8:47 PM  Result Value Ref Range Status   Specimen Description URINE, CATHETERIZED  Final   Special Requests Normal  Final   Culture  Setup Time   Final   Culture NO GROWTH  Final   Report Status 04/19/2014 FINAL  Final  MRSA PCR Screening     Status: None   Collection Time: 04/18/14  3:55 AM  Result Value Ref Range Status   MRSA by PCR NEGATIVE NEGATIVE Final  Clostridium Difficile by PCR     Status: None   Collection Time: 04/23/14  7:07 AM  Result Value Ref Range Status   C difficile by pcr NEGATIVE NEGATIVE Final    Comment[facility]     Scheduled Meds: . aspirin  81 mg Oral Daily  . heparin subcutaneous  5,000 Units Subcutaneous 3 times per day  . insulin aspart  0-15 Units Subcutaneous TID WC  . insulin aspart  0-5 Units Subcutaneous QHS  . insulin aspart  4 Units Subcutaneous TID WC  . insulin glargine  10 Units Subcutaneous QHS  . levofloxacin (LEVAQUIN) IV  750 mg Intravenous Q48H  . magic mouthwash w/lidocaine  2 mL Oral QID  . metoprolol  5 mg Intravenous 4 times per day  . potassium chloride  40 mEq Oral Once  . terbinafine   Topical BID   Continuous Infusions: . sodium chloride 10 mL/hr at 04/22/14 1642

## 2014-04-23 NOTE — Progress Notes (Signed)
    Subjective:  Remains very confused; Denies CP or dyspnea   Objective:  Filed Vitals:   04/22/14 2103 04/22/14 2104 04/22/14 2329 04/23/14 0614  BP:  119/63 152/80 131/52  Pulse:  116 88 67  Temp: 99.1 F (37.3 C) 97.1 F (36.2 C)  98.1 F (36.7 C)  TempSrc: Axillary Oral  Axillary  Resp:  18  18  Height:      Weight:      SpO2:  97%  99%    Intake/Output from previous day:  Intake/Output Summary (Last 24 hours) at 04/23/14 [id number] Last data filed at 04/23/14 [id number]  Gross per 24 hour  Intake    898 ml  Output   1250 ml  Net   -352 ml    Physical Exam: Physical exam: Well-developed obese in no acute distress.  Skin is warm and dry.  HEENT is normal.  Neck is supple.  Chest is clear to auscultation with normal expansion anteriorly Cardiovascular exam is irregular Abdominal exam nontender or distended. No masses palpated. Extremities show no edema. neuro grossly intact    Lab Results: Basic Metabolic Panel:  Recent Labs  54/12/8110/26/15 0533  NA 144  K 3.0*  CL 116*  CO2 23  GLUCOSE 141*  BUN 18  CREATININE 1.53*  CALCIUM 8.1*   CBC:  Recent Labs  04/22/14 0533  WBC 10.8*  HGB 11.4*  HCT 34.4*  MCV 89.8  PLT 229   Cardiac Enzymes:  Recent Labs  04/22/14 0533  CKTOTAL 96     Assessment/Plan:  1 paroxysmal atrial fibrillation-this is felt secondary to the hyperadrenergic state associated with her infection. She remains in sinus with frequent PACs and brief runs of PAT. Continue metoprolol  50 mg by mouth twice a day and aspirin. Plan to initiate anticoagulation once her mental status improves. 2 elevated troponin-felt secondary to atrial fibrillation, pneumonia, rhabdomyolysis and renal insufficiency. Would favor nuclear study when she recovers from her present illness. This could be performed as an outpatient. 3 pneumonia-continue antibiotics per primary care. 4 confusion-significant; etiology unclear. Further evaluation per primary care. 5  acute on chronic stage III renal failure-renal function slowly improving. Patient will need close follow-up with Dr. Allyson SabalBerry following discharge. Olga MillersBrian Crenshaw 04/23/2014, 7:21 AM

## 2014-04-24 DIAGNOSIS — I480 Paroxysmal atrial fibrillation: Secondary | ICD-10-CM

## 2014-04-24 LAB — BASIC METABOLIC PANEL
Anion gap: 5 (ref 5–15)
BUN: 17 mg/dL (ref 6–23)
CALCIUM: 8.5 mg/dL (ref 8.4–10.5)
CO2: 26 mmol/L (ref 19–32)
Chloride: 123 mEq/L — ABNORMAL HIGH (ref 96–112)
Creatinine, Ser: 1.59 mg/dL — ABNORMAL HIGH (ref 0.50–1.10)
GFR calc Af Amer: 37 mL/min — ABNORMAL LOW (ref >90)
GFR, EST NON AFRICAN AMERICAN: 32 mL/min — AB (ref >90)
GLUCOSE: 128 mg/dL — AB (ref 70–99)
Potassium: 3.1 mmol/L — ABNORMAL LOW (ref 3.5–5.1)
Sodium: 154 mmol/L — ABNORMAL HIGH (ref 135–145)

## 2014-04-24 LAB — GLUCOSE, CAPILLARY
GLUCOSE-CAPILLARY: 144 mg/dL — AB (ref 70–99)
GLUCOSE-CAPILLARY: 154 mg/dL — AB (ref 70–99)
Glucose-Capillary: 126 mg/dL — ABNORMAL HIGH (ref 70–99)
Glucose-Capillary: 191 mg/dL — ABNORMAL HIGH (ref 70–99)
Glucose-Capillary: 92 mg/dL (ref 70–99)

## 2014-04-24 LAB — CBC
HCT: 37.2 % (ref 36.0–46.0)
Hemoglobin: 12 g/dL (ref 12.0–15.0)
MCH: 29.5 pg (ref 26.0–34.0)
MCHC: 32.3 g/dL (ref 30.0–36.0)
MCV: 91.4 fL (ref 78.0–100.0)
PLATELETS: 201 10*3/uL (ref 150–400)
RBC: 4.07 MIL/uL (ref 3.87–5.11)
RDW: 15.2 % (ref 11.5–15.5)
WBC: 13.7 10*3/uL — AB (ref 4.0–10.5)

## 2014-04-24 MED ORDER — POTASSIUM CHLORIDE 10 MEQ/100ML IV SOLN
10.0000 meq | Freq: Once | INTRAVENOUS | Status: AC
  Administered 2014-04-24: 10 meq via INTRAVENOUS
  Filled 2014-04-24: qty 100

## 2014-04-24 MED ORDER — POTASSIUM CHLORIDE CRYS ER 20 MEQ PO TBCR: 40.0000 meq | EXTENDED_RELEASE_TABLET | Freq: Once | ORAL | Status: DC

## 2014-04-24 MED ORDER — POTASSIUM CHLORIDE 10 MEQ/100ML IV SOLN
10.0000 meq | INTRAVENOUS | Status: AC
  Administered 2014-04-24 (×2): 10 meq via INTRAVENOUS
  Filled 2014-04-24 (×3): qty 100

## 2014-04-24 MED ORDER — SODIUM CHLORIDE 0.9 % IV SOLN
INTRAVENOUS | Status: AC
  Administered 2014-04-24: 11:00:00 via INTRAVENOUS

## 2014-04-24 NOTE — Progress Notes (Addendum)
Patient ID: [name], female   DOB: 01/13/1946, 68 y.o.   MRN: [id number] [facility] PROGRESS NOTE  [name] WUJ:[id number] DOB: 02/14/1946 DOA: 04/17/2014 PCP: No primary care provider on file.  Brief narrative:    68 y.o. female with a PMH of thyroid disease, not on home medications, who was admitted 04/17/14 after becoming weak and falling at home. There was no loss of consciousness. Family reports generalized failure to thrive over the past 3-4 years. Upon initial evaluation in the ED, the patient was found to have a potassium of 2.4, a glucose of 953, a creatinine of 1.92, WBC of 29.2, CK 1917, proBNP 7693.0 and a lactic acid of 8.74. She was admitted for evaluation and treatment of sepsis, HONK, and rhabdomyolysis.   Hospital course is complicated with development of atrial fibrillation with rapid ventricular rate, patient was started on heparin drip and cardiology has seen the patient in consultation. Patient has converted to sinus rhythm and per cardiology we stopped heparin drip 04/20/2014. She will likely need nuclear study once acute infection resolves.  Assessment/Plan:     Principal Problem:  Sepsis secondary to community-acquired pneumonia with lactic acidosis and leukocytosis  Based on CT scan, right lower lobe pneumonia. Chest x-ray did not show significant infiltrates. Urinalysis did not reveal UTI.   Blood culture not collected on admission. Urine culture shows no growth.  Patient was initially on empiric antibiotics, Zosyn and vancomycin but this was narrowed down to Levaquin.  Leukocytosis is improving, 21 --> 12.7 --> 10.8   Active Problems:   New atrial fibrillation  Appreciate cardiology consult and recommendations. Patient was on heparin drip but stopped 04/20/2014 because patient converted to sinus rhythm.  Metoprolol increased to 50 mg BID per cardio. She however refused PO metoprolol so we switched to 5 mg IV every 6 hours on 04/23/2014.    Patient will nee nuclear study done once acute infection clears. Likely outpatient.    Hyperosmolar non-ketotic state in patient with type 2 diabetes mellitus / high anion gap metabolic acidosis  Anion gap 25 on admission. Anion gap now closed.  Continue current insulin regimen Lantus 10 units at bedtime along with NovoLog 4 units 3 times daily with meals and sliding scale insulin.  Diarrhea  C.diff negative. Put temporary flexiseal to prevent skin damage from diarrhea. Start lomotil.    Elevated brain natriuretic peptide (BNP) level / elevated troponin  2-D echocardiogram shows preserved ejection fraction.  Troponin elevation likely from demand ischemia. Troponin level trended down, last 2 sets: 0.27 --> 0.21  Pt initially on heparin but this was stopped 04/20/2014.Marland Kitchen.  Continue beta blockers as indicated above.  Yeast dermatitis  Lamisil cream ordered.   History of thyroid disease  TSH is within normal limits.   Hypokalemia  Magnesium level within normal limits. Potassium being supplemented.    Hypernatremia  Likely due to GI losses  Will give low rate IV fluids and repeat sodium level in am   Rhabdomyolysis  Continue low rate IV fluids because of high BNP level.  CPK down from 1760 to 747 to WNL.     AKI (acute kidney injury)  Likely secondary to sepsis and rhabdomyolysis versus possible chronic kidney disease but we do not have previous values for comparison.  Creatinine has improved with IV fluids from 2.73 to 1.59.   Altered mental status / metabolic encephalopathy  Unclear etiology. Pt still confused, altered mental status.    DVT Prophylaxis  Heparin subcutaneous for DVT  prophylaxis.  Code Status: Full. Family Communication: Family at the bedside, updated 04/24/2014. Disposition Plan: Follow up on PT evaluation.    IV access:   Peripheral IV  Procedures and diagnostic studies:     Ct Abdomen Pelvis Wo Contrast 04/18/2014: 1.  Right lower lobe pneumonia noted; this likely explains the patient's symptoms. 2. Cholelithiasis; gallbladder otherwise unremarkable. 3. Small to moderate umbilical hernia, containing only fat. 4. Mildly enlarged fibroid uterus noted. Electronically Signed By: Roanna RaiderJeffery Chang M.D. On: 04/18/2014 05:07   Dg Chest 2 View 04/17/2014: Elevated right hemidiaphragm with associated right basilar atelectasis. No consolidation or significant edema. Electronically Signed By: Roxy HorsemanBill Veazey M.D. On: 04/17/2014 21:36   Dg Lumbar Spine 2-3 Views 04/17/2014: 1. No definite acute traumatic injury within the lumbar spine. 2. Anterior wedging of the L1 and L2 vertebral bodies, favored to be chronic in nature. Possible acute compression fractures are not entirely excluded. Correlation with site of pain recommended.   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 No fracture or dislocation. Electronically Signed By: Bretta BangWilliam Woodruff M.D. On: 04/17/2014 21:37   Dg Tibia/fibula Left 04/17/2014: No evidence of fracture or dislocation.  Medical Consultants:   Cardiology   Other Consultants:   None   IAnti-Infectives:    Vancomycin 04/17/14---> 04/18/14  Zosyn 04/17/14---> 04/18/14  Levaquin 04/18/14--->    Manson PasseyEVINE, Lynnel, MD  [facility] Pager [phone]  If 7PM-7AM, please contact night-coverage [email or web address] Password [email or web address] 04/24/2014, 9:29 AM   LOS: 7 days    HPI/Subjective: No acute overnight events.  Objective: Filed Vitals:   04/23/14 0614 04/23/14 1802 04/23/14 2212 04/24/14 0442  BP: 131/52 136/80 151/90 151/70  Pulse: 67 84 60 74  Temp: 98.1 F (36.7 C)  98.7 F (37.1 C) 97.9 F (36.6 C)  TempSrc: Axillary  Oral Oral  Resp: 18  20 20   Height:      Weight:      SpO2: 99%  99% 95%    Intake/Output Summary (Last 24 hours) at 04/24/14 0929 Last data filed at  04/24/14 0700  Gross per 24 hour  Intake    330 ml  Output    775 ml  Net   -445 ml    Exam:   General:  Pt is not in acute distress  Cardiovascular: Regular rate and rhythm, S1/S2 appreciated   Respiratory: no wheezing, no crackles, no rhonchi  Abdomen: Soft, non tender, non distended, bowel sounds present  Extremities: pulses DP and PT palpable bilaterally  Neuro: Grossly nonfocal  Data Reviewed: Basic Metabolic Panel:  Recent Labs Lab 04/18/14 0047  04/18/14 1500 04/18/14 2123 04/19/14 0359 04/20/14 0150 04/22/14 0533 04/24/14 0404  NA 138  < > 145  --  142 144 144 154*  K 2.1*  < > 3.0* 3.4* 3.9 3.1* 3.0* 3.1*  CL 98  < > 108  --  111 115* 116* 123*  CO2 27  < > 25  --  22 24 23 26   GLUCOSE 822*  < > 131*  --  198* 218* 141* 128*  BUN 21  < > 23  --  27* 24* 18 17  CREATININE 2.58*  < > 2.64*  --  2.67* 2.06* 1.53* 1.59*  CALCIUM 8.0*  < > 7.7*  --  7.3* 7.7* 8.1* 8.5  MG 1.7  --   --   --  1.8  --   --   --   < > =  values in this interval not displayed. Liver Function Tests:  Recent Labs Lab 04/17/14 2033  AST 44*  ALT 17  ALKPHOS 155*  BILITOT 1.1  PROT 7.5  ALBUMIN 2.6*    Recent Labs Lab 04/18/14 0047  LIPASE 29    Recent Labs Lab 04/18/14 0049  AMMONIA 35*   CBC:  Recent Labs Lab 04/17/14 2033 04/18/14 0209 04/19/14 0359 04/20/14 0150 04/22/14 0533 04/24/14 0404  WBC 29.2* 23.5* 21.0* 12.7* 10.8* 13.7*  NEUTROABS 26.8*  --   --   --   --   --   HGB 15.2* 13.3 11.8* 11.1* 11.4* 12.0  HCT 46.8* 39.3 34.8* 33.5* 34.4* 37.2  MCV 92.9 89.1 88.5 90.1 89.8 91.4  PLT 485* 306 246 235 229 201   Cardiac Enzymes:  Recent Labs Lab 04/17/14 2033 04/18/14 0017 04/18/14 0900 04/18/14 1500 04/18/14 2123 04/19/14 0359 04/20/14 0150 04/22/14 0533  CKTOTAL 1917*  --   --   --   --  1760* 747* 96  TROPONINI <0.30 0.18* 0.27* 0.28* 0.21*  --   --   --    BNP: Invalid input(s): POCBNP CBG:  Recent Labs Lab 04/23/14 0819  04/23/14 1213 04/23/14 1627 04/23/14 2102 04/24/14 0758  GLUCAP 172* 169* 139* 144* 126*    Urine culture     Status: None   Collection Time: 04/17/14  8:47 PM  Result Value Ref Range Status   Specimen Description URINE, CATHETERIZED  Final   Special Requests Normal  Final   Culture  Setup Time   Final    04/18/2014 04:23 Performed at [facility]    Colony Count NO GROWTH   Final   Report Status 04/19/2014 FINAL  Final  MRSA PCR Screening     Status: None   Collection Time: 04/18/14  3:55 AM  Result Value Ref Range Status   MRSA by PCR NEGATIVE NEGATIVE Final  Clostridium Difficile by PCR     Status: None   Collection Time: 04/23/14  7:07 AM  Result Value Ref Range Status   C difficile by pcr NEGATIVE NEGATIVE Final    Comment: Performed at [facility]     Scheduled Meds: . aspirin  81 mg Oral Daily  . heparin subcutaneous  5,000 Units Subcutaneous 3 times per day  . insulin aspart  0-15 Units Subcutaneous TID WC  . insulin aspart  0-5 Units Subcutaneous QHS  . insulin aspart  4 Units Subcutaneous TID WC  . insulin glargine  10 Units Subcutaneous QHS  . levofloxacin   750 mg Intravenous Q48H  . magic mouthwash   2 mL Oral QID  . metoprolol  5 mg Intravenous 4 times per day  . potassium chloride  40 mEq Oral Once  . terbinafine   Topical BID   Continuous Infusions: . sodium chloride 10 mL/hr at 04/22/14 1642

## 2014-04-24 NOTE — Progress Notes (Signed)
Physical Therapy Treatment Patient Details Name: [name] MRN: [id number] DOB: 08/18/1945 Today's Date: 04/24/2014    History of Present Illness 68 yo female adm 04/17/14 after fall at home, dx with presumed pneumonia/sepsis with diabetic ketoacidosis and altered mental status. PMHx:  thyroid disease, afib, obesity    PT Comments    Pt husband present but stepped outside room during session; Discussed level of assist pt is needing with husband and he stated he would have to ask his wife ( I informed him she appeared too confused to make a the decision alone); Pt states he sent the SW away before but is considering SNF now; SW present following my conversation with the husband; Husband reports that pt sister would be taking care of her at home (RN reports that pt sister has difficulty walking)  Follow Up Recommendations  SNF;Supervision/Assistance - 24 hour     Equipment Recommendations  Rolling walker with 5" wheels    Recommendations for Other Services       Precautions / Restrictions Precautions Precautions: Fall Precaution Comments: pt remains confused, decr attention to R side intermittently, no focal weakness or asymmetry noted    Mobility  Bed Mobility Overal bed mobility: Needs Assistance;+2 for physical assistance Bed Mobility: Sit to Supine;Supine to Sit     Supine to sit: +2 for physical assistance;Mod assist Sit to supine: +2 for physical assistance;Max assist   General bed mobility comments: cues for sequence with assist to manage trunk and LEs  Transfers Overall transfer level: Needs assistance Equipment used: Rolling walker (2 wheeled) Transfers: Sit to/from Stand;Stand Pivot Transfers Sit to Stand: +2 physical assistance;Max assist;Mod assist Stand pivot transfers: +2 physical assistance;Mod assist;Max assist       General transfer comment: multi-modal cues for sequence, safety, hand placement, foot placement, etc; pt requiring +2 throughout session  for mobility and pt/staff safety; sit sto stands repeated x2 for activity tol adn instruction  Ambulation/Gait             General Gait Details: Pt with difficulty following cues to complete stand pvt.  Unsafe at this time to attempt ambulation 2* pt size and level of confusion   Stairs            Wheelchair Mobility    Modified Rankin (Stroke Patients Only)       Balance Overall balance assessment: Needs assistance Sitting-balance support: Bilateral upper extremity supported;Feet supported Sitting balance-Leahy Scale: Good     Standing balance support: Bilateral upper extremity supported;During functional activity Standing balance-Leahy Scale: Zero                      Cognition Arousal/Alertness: Awake/alert Behavior During Therapy: Restless Overall Cognitive Status: Impaired/Different from baseline Area of Impairment: Orientation;Attention;Following commands;Safety/judgement Orientation Level: Disoriented to;Place;Time;Situation Current Attention Level: Focused   Following Commands: Follows one step commands inconsistently Safety/Judgement: Decreased awareness of safety;Decreased awareness of deficits     General Comments: pt repeatedly states "help me";     Exercises      General Comments        Pertinent Vitals/Pain Pain Assessment: No/denies pain    Home Living                      Prior Function            PT Goals (current goals can now be found in the care plan section) Acute Rehab PT Goals Patient Stated Goal: Be able to walk to  bathroom PT Goal Formulation: Patient unable to participate in goal setting Time For Goal Achievement: 05/05/14 Potential to Achieve Goals: Fair Progress towards PT goals: Not progressing toward goals - comment (d/t confusion)    Frequency  Min 3X/week    PT Plan Discharge plan needs to be updated    Co-evaluation             End of Session Equipment Utilized During Treatment:  Gait belt Activity Tolerance: Patient limited by fatigue;Other (comment) (confusion) Patient left: in bed;with call bell/phone within reach;with bed alarm set     Time: [id number] PT Time Calculation (min) (ACUTE ONLY): 29 min  Charges:  $Therapeutic Activity: 23-37 mins                    G Codes:      Macai Sisneros 04/24/2014, 4:13 PM

## 2014-04-24 NOTE — Progress Notes (Signed)
INITIAL NUTRITION ASSESSMENT  DOCUMENTATION CODES Per approved criteria  -Obesity Unspecified   INTERVENTION: -Recommend Magic Cup supplement PRN, each supplement provides 290 kcal, 9 gram protein -Reviewed nutrition therapy for taste/smell changes -Discussed potential medication interactions with pharmacy and RN; consider d/c chlorhexidine as this may be contributing -Recommend for liberalized diet to encourage PO intake -Encourage PT/OT to stimulate appetite  NUTRITION DIAGNOSIS: Inadequate oral intake related to taste/smell changes as evidenced by PO intake < 75% for 7 days.   Goal: Pt to meet >/= 90% of their estimated nutrition needs    Monitor:  Supplement tolerance, total protein/energy intake, labs, weights  Reason for Assessment: Braden Score < 4312  68 y.o. female  Admitting Dx: Sepsis  ASSESSMENT: [name] is a 68 y.o. female  Pt reports becoming weak and falling at home several hours ago. Pt reports becoming weak and having her legs give way while she was walking  -Pt's family denied any changes in appetite or weigh pta; however has had minimal PO intake during admit for 7 days -Pt confused, however reports altered taste and smell changes that make all foods unappealing -Family has been bringing in foods from home and purchased at the cafeteria to encourage PO intake; however pt refuses all meals d/t smell/taste -Reviewed nutrition therapy for taste changes; family declined use of lemon drops or mints d/t concern for choking with pt's current altered mental state.  -Reviewed potential medication interactions with pharmacy. Pharmacy noted potential for hypogeusia with salty taste with chlorhexidine; consider d/c'ing. RN noted that this is a possible contributor; however pt has been refusing many oral medications, including oral care -Pt agreeable to drink regular Coke, family concern for elevated CBG. Discussed with RN, who was in agreement to use appropriate  insulin coverage as needed and to continue to allow regular soda -Pt also agreeable to trial MagicCup orange supplement, will f/u pt's acceptance on 12/29.  Height: Ht Readings from Last 1 Encounters:  04/17/14 5\' 7"  (1.702 m)    Weight: Wt Readings from Last 1 Encounters:  04/20/14 252 lb 13.9 oz (114.7 kg)    Ideal Body Weight: 135 lb  % Ideal Body Weight: 187%  Wt Readings from Last 10 Encounters:  04/20/14 252 lb 13.9 oz (114.7 kg)    Usual Body Weight: 250 lb per med records  % Usual Body Weight: 100%  BMI:  Body mass index is 39.6 kg/(m^2).  Estimated Nutritional Needs: Kcal: 1800-2000 Protein: 90-100 gram Fluid: >= 1800 ml daily  Skin: + 1 generalized edema, +1 RLE and LLE edemas, MASD on buttocks and perineal regions  Diet Order: Diet Carb Modified  EDUCATION NEEDS: -Education needs addressed   Intake/Output Summary (Last 24 hours) at 04/24/14 1635 Last data filed at 04/24/14 1500  Gross per 24 hour  Intake  375.5 ml  Output    775 ml  Net -399.5 ml    Last BM: 12/28; diarrhea, C.diff r/o'd  Labs:   Recent Labs Lab 04/18/14 0047  04/19/14 0359 04/20/14 0150 04/22/14 0533 04/24/14 0404  NA 138  < > 142 144 144 154*  K 2.1*  < > 3.9 3.1* 3.0* 3.1*  CL 98  < > 111 115* 116* 123*  CO2 27  < > 22 24 23 26   BUN 21  < > 27* 24* 18 17  CREATININE 2.58*  < > 2.67* 2.06* 1.53* 1.59*  CALCIUM 8.0*  < > 7.3* 7.7* 8.1* 8.5  MG 1.7  --  1.8  --   --   --  GLUCOSE 822*  < > 198* 218* 141* 128*  < > = values in this interval not displayed.  CBG (last 3)   Recent Labs  04/23/14 2102 04/24/14 0758 04/24/14 1301  GLUCAP 144* 126* 154*    Scheduled Meds: . antiseptic oral rinse  7 mL Mouth Rinse q12n4p  . aspirin  81 mg Oral Daily  . chlorhexidine  15 mL Mouth Rinse BID  . heparin subcutaneous  5,000 Units Subcutaneous 3 times per day  . insulin aspart  0-15 Units Subcutaneous TID WC  . insulin aspart  0-5 Units Subcutaneous QHS  . insulin  aspart  4 Units Subcutaneous TID WC  . insulin glargine  10 Units Subcutaneous QHS  . levofloxacin (LEVAQUIN) IV  750 mg Intravenous Q48H  . magic mouthwash w/lidocaine  2 mL Oral QID  . metoprolol  5 mg Intravenous 4 times per day  . sodium chloride  3 mL Intravenous Q12H  . terbinafine   Topical BID    Continuous Infusions: . sodium chloride 30 mL/hr at 04/24/14 1432    Past Medical History  Diagnosis Date  . Thyroid disease     Past Surgical History  Procedure Laterality Date  . Cholecystectomy     Lloyd HugerSarah F Chamaine Stankus MS RD LDN Clinical Dietitian Pager:[phone]

## 2014-04-24 NOTE — Progress Notes (Signed)
Subjective:  Confused; no shortness of breath  Objective:   Vital Signs in the last 24 hours: Temp:  [97.9 F (36.6 C)-98.7 F (37.1 C)] 97.9 F (36.6 C) (12/28 0442) Pulse Rate:  [60-84] 74 (12/28 0442) Resp:  [20] 20 (12/28 0442) BP: (136-151)/(70-90) 151/70 mmHg (12/28 0442) SpO2:  [95 %-99 %] 95 % (12/28 0442)  Intake/Output from previous day: 12/27 0701 - 12/28 0700 In: 470.7 [P.O.:120; I.V.:200.7; IV Piggyback:150] Out: 925 [Urine:925]  Medications: . antiseptic oral rinse  7 mL Mouth Rinse q12n4p  . aspirin  81 mg Oral Daily  . chlorhexidine  15 mL Mouth Rinse BID  . heparin subcutaneous  5,000 Units Subcutaneous 3 times per day  . insulin aspart  0-15 Units Subcutaneous TID WC  . insulin aspart  0-5 Units Subcutaneous QHS  . insulin aspart  4 Units Subcutaneous TID WC  . insulin glargine  10 Units Subcutaneous QHS  . levofloxacin (LEVAQUIN) IV  750 mg Intravenous Q48H  . magic mouthwash w/lidocaine  2 mL Oral QID  . metoprolol  5 mg Intravenous 4 times per day  . potassium chloride  40 mEq Oral Once  . sodium chloride  3 mL Intravenous Q12H  . terbinafine   Topical BID    . sodium chloride 10 mL/hr at 04/22/14 1642    Physical Exam:   General appearance: Confused; no distress Neck: no adenopathy, no carotid bruit, no JVD, supple, symmetrical, trachea midline and thyroid not enlarged, symmetric, no tenderness/mass/nodules Lungs: clear to auscultation bilaterally Heart: regular rate and rhythm Abdomen: soft, non-tender; bowel sounds normal; no masses,  no organomegaly Extremities: no edema, redness or tenderness in the calves or thighs   Rate: 78  Rhythm: normal sinus rhythm  Lab Results:   Recent Labs  04/22/14 0533 04/24/14 0404  NA 144 154*  K 3.0* 3.1*  CL 116* 123*  CO2 23 26  GLUCOSE 141* 128*  BUN 18 17  CREATININE 1.53* 1.59*   CBC Latest Ref Rng 04/24/2014 04/22/2014 04/20/2014  WBC 4.0 - 10.5 K/uL 13.7(H) 10.8(H) 12.7(H)    Hemoglobin 12.0 - 15.0 g/dL 16.112.0 11.4(L) 11.1(L)  Hematocrit 36.0 - 46.0 % 37.2 34.4(L) 33.5(L)  Platelets 150 - 400 K/uL 201 229 235    No results for input(s): TROPONINI in the last 72 hours.  Invalid input(s): CK, MB  Hepatic Function Panel No results for input(s): PROT, ALBUMIN, AST, ALT, ALKPHOS, BILITOT, BILIDIR, IBILI in the last 72 hours. No results for input(s): INR in the last 72 hours. BNP (last 3 results)  Recent Labs  04/17/14 2033  PROBNP 7693.0*    Lipid Panel  No results found for: CHOL, TRIG, HDL, CHOLHDL, VLDL, LDLCALC, LDLDIRECT    Imaging:  Study Conclusions  - Left ventricle: The cavity size was normal. Wall thickness was normal. Systolic function was normal. The estimated ejection fraction was in the range of 55% to 60%. - Left atrium: The atrium was mildly dilated.   Assessment/Plan:   Principal Problem:   Sepsis Active Problems:   Hyperosmolar non-ketotic state in patient with type 2 diabetes mellitus   Elevated brain natriuretic peptide (BNP) level   Atrial fibrillation with RVR   Leukocytosis   Hypokalemia   Rhabdomyolysis   AKI (acute kidney injury)   High anion gap metabolic acidosis   Lactic acidosis   Altered mental status   Elevated troponin   Yeast dermatitis   1. Paroxysmal atrial fibrillation-  She remains in sinus rhthym; ectopy is  improved. Continue metoprolol 50 mg by mouth twice a day and aspirin. Plan to initiate anticoagulation once her mental status improves. 2. Elevated troponin-felt secondary to atrial fibrillation, pneumonia, rhabdomyolysis and renal insufficiency. Would favor nuclear study when she recovers from her present illness. This could be performed as an outpatient. 3. Pneumonia-currently on levaquin; WBC 13.7 today 4. Electrolyte imbalance with hypernatremia Na 154 and hypokalemia 3.1. May need to increase maintenance fluid; K replete 5. Confusion- persists  6. Acute on chronic stage III renal  failure-renal function slowly improving. 7. Demand ischemia in etiology of initial mild trop elevation   Natalie Biharihomas A. Aibhlinn Kalmar, MD, [facility] Center[facility] 04/24/2014, 9:10 AM

## 2014-04-24 NOTE — Progress Notes (Signed)
CSW spoke with patient & husband at bedside re: discharge planning - patient & husband are now agreeable with plan for SNF, [facility] their home. [facility] patient when ready.   Clinical Social Work Department CLINICAL SOCIAL WORK PLACEMENT NOTE 04/24/2014  Patient:  Natalie RenshawHAVIS,Jakalyn C  Account Number:  [id number] Admit date:  04/17/2014  Clinical Social Worker:  Orpah GreekKELLY FOLEY, LCSWA  Date/time:  04/24/2014 04:19 PM  Clinical Social Work is seeking post-discharge placement for this patient at the following level of care:   SKILLED NURSING   (*CSW will update this form in Epic as items are completed)   04/24/2014  [facility]family provided with [facility] of Clinical Social Work's list of facilities offering this level of care within the geographic area requested by the patient (or if unable, by the patient's family).  04/24/2014  Patient/family informed of their freedom to choose among providers that offer the needed level of care, that participate in Medicare, Medicaid or managed care program needed by the patient, have an available bed and are willing to accept the patient.  04/24/2014  Patient/family informed of [facility]' ownership interest in [facility], as well as of the fact that they are under no obligation to receive care at this facility.  PASARR submitted to EDS on 04/24/2014 PASARR number received on 04/24/2014  FL2 transmitted to all facilities in geographic area requested by pt/family on  04/24/2014 FL2 transmitted to all facilities within larger geographic area on   Patient informed that his/her managed care company has contracts with or will negotiate with  certain facilities, including the following:     Patient/family informed of bed offers received:   Patient chooses bed at  Physician recommends and patient chooses bed at     Patient to be transferred to  on   Patient to be transferred to facility by  Patient and family notified of transfer on  Name of family member notified:    The following physician request were entered in Epic:   Additional Comments:   Lincoln MaxinKelly Maddelyn Rocca, LCSW [facility] Clinical Social Worker cell #: [phone]

## 2014-04-25 LAB — BRAIN NATRIURETIC PEPTIDE: B Natriuretic Peptide: 406.5 pg/mL — ABNORMAL HIGH (ref 0.0–100.0)

## 2014-04-25 LAB — BASIC METABOLIC PANEL
Anion gap: 5 (ref 5–15)
BUN: 16 mg/dL (ref 6–23)
CHLORIDE: 122 meq/L — AB (ref 96–112)
CO2: 27 mmol/L (ref 19–32)
Calcium: 8.1 mg/dL — ABNORMAL LOW (ref 8.4–10.5)
Creatinine, Ser: 1.74 mg/dL — ABNORMAL HIGH (ref 0.50–1.10)
GFR calc Af Amer: 33 mL/min — ABNORMAL LOW (ref >90)
GFR calc non Af Amer: 29 mL/min — ABNORMAL LOW (ref >90)
GLUCOSE: 104 mg/dL — AB (ref 70–99)
Potassium: 3.8 mmol/L (ref 3.5–5.1)
SODIUM: 154 mmol/L — AB (ref 135–145)

## 2014-04-25 LAB — GLUCOSE, CAPILLARY: GLUCOSE-CAPILLARY: 97 mg/dL (ref 70–99)

## 2014-04-25 MED ORDER — INSULIN ASPART 100 UNIT/ML ~~LOC~~ SOLN
4.0000 [IU] | Freq: Three times a day (TID) | SUBCUTANEOUS | Status: DC
Start: 2014-04-25 — End: 2014-05-17

## 2014-04-25 MED ORDER — DIAZEPAM 5 MG/ML IJ SOLN: 5.0000 mg | INTRAMUSCULAR | Status: DC | PRN

## 2014-04-25 MED ORDER — INSULIN GLARGINE 100 UNIT/ML ~~LOC~~ SOLN: 10.0000 [IU] | Freq: Every day | SUBCUTANEOUS | Status: DC

## 2014-04-25 MED ORDER — TERBINAFINE HCL 1 % EX CREA: TOPICAL_CREAM | Freq: Two times a day (BID) | CUTANEOUS | Status: DC

## 2014-04-25 MED ORDER — ASPIRIN 81 MG PO CHEW: 81.0000 mg | CHEWABLE_TABLET | Freq: Every day | ORAL | Status: DC

## 2014-04-25 MED ORDER — ONDANSETRON HCL 4 MG PO TABS: 4.0000 mg | ORAL_TABLET | Freq: Four times a day (QID) | ORAL | Status: DC | PRN

## 2014-04-25 MED ORDER — METOPROLOL TARTRATE 50 MG PO TABS: 50.0000 mg | ORAL_TABLET | Freq: Two times a day (BID) | ORAL | Status: DC

## 2014-04-25 MED ORDER — INSULIN ASPART 100 UNIT/ML ~~LOC~~ SOLN: 0.0000 [IU] | Freq: Three times a day (TID) | SUBCUTANEOUS | Status: DC

## 2014-04-25 MED ORDER — POLYETHYLENE GLYCOL 3350 17 G PO PACK: 17.0000 g | PACK | Freq: Every day | ORAL | Status: DC | PRN

## 2014-04-25 MED ORDER — DIPHENOXYLATE-ATROPINE 2.5-0.025 MG PO TABS: 1.0000 | ORAL_TABLET | Freq: Four times a day (QID) | ORAL | Status: DC | PRN

## 2014-04-25 MED ORDER — ACETAMINOPHEN 325 MG PO TABS: 650.0000 mg | ORAL_TABLET | Freq: Four times a day (QID) | ORAL | Status: DC | PRN

## 2014-04-25 MED ORDER — MAGIC MOUTHWASH W/LIDOCAINE: 2.0000 mL | Freq: Four times a day (QID) | ORAL | Status: DC

## 2014-04-25 MED ORDER — FUROSEMIDE 20 MG PO TABS: 20.0000 mg | ORAL_TABLET | Freq: Every day | ORAL | Status: DC

## 2014-04-25 NOTE — Discharge Instructions (Signed)

## 2014-04-25 NOTE — Discharge Summary (Signed)
Physician Discharge Summary  [name] ZOX:[id number] DOB: 11/01/1945 DOA: 04/17/2014  PCP: No primary care provider on file.  Admit date: 04/17/2014 Discharge date: 04/25/2014  Recommendations for Outpatient Follow-up:  Per cardiology - Elevated troponin-felt secondary to atrial fibrillation, pneumonia, rhabdomyolysis and renal insufficiency with probable demand ischemia rather than ACS. Would favor nuclear study when she recovers from her present illness. This could be performed as an outpatient.  Please have patient follow up with cardiology in 1-2 weeks after discharge to set up nuclear study.  Please recheck a BMP in the next 2-3 days. Follow-up sodium level. Sodium was 154 prior to discharge. We added low dose lasix which should help lower sodium level. Potassium was within normal limits prior to discharge. Also, recheck creatinine. Her creatinine is 1.74 prior to discharge.   Discharge Diagnoses:  Principal Problem:   Sepsis Active Problems:   Hyperosmolar non-ketotic state in patient with type 2 diabetes mellitus   Elevated brain natriuretic peptide (BNP) level   Atrial fibrillation with RVR   Leukocytosis   Hypokalemia   Rhabdomyolysis   AKI (acute kidney injury)   High anion gap metabolic acidosis   Lactic acidosis   Altered mental status   Elevated troponin   Yeast dermatitis    Discharge Condition: stable   Diet recommendation: as tolerated   History of present illness:   68 y.o. female with a PMH of thyroid disease, not on home medications, who was admitted 04/17/14 after becoming weak and falling at home. There was no loss of consciousness. Family reports generalized failure to thrive over the past 3-4 years. Upon initial evaluation in the ED, the patient was found to have a potassium of 2.4, a glucose of 953, a creatinine of 1.92, WBC of 29.2, CK 1917, proBNP 7693.0 and a lactic acid of 8.74. She was admitted for evaluation and treatment of sepsis, HONK, and  rhabdomyolysis.   Hospital course is complicated with development of atrial fibrillation with rapid ventricular rate, patient was started on heparin drip and cardiology has seen the patient in consultation. Patient has converted to sinus rhythm and per cardiology we stopped heparin drip 04/20/2014. She will likely need nuclear study once acute infection resolves.  Assessment/Plan:     Principal Problem:  Sepsis secondary to community-acquired pneumonia with lactic acidosis and leukocytosis  Based on CT scan, right lower lobe pneumonia. Chest x-ray did not show significant infiltrates. Urinalysis did not reveal UTI.   Blood culture not collected on admission. Urine culture shows no growth.  Patient was initially on empiric antibiotics, Zosyn and vancomycin but this was narrowed down to Levaquin. Levaquin can be stopped prior to discharge  Leukocytosis is improving, 21 --> 12.7 --> 10.8 (slightly up in past 24 hours 13.7 but no fevers or respiratory distress)   Active Problems:  New atrial fibrillation  Appreciate cardiology consult and recommendations. Patient was on heparin drip but stopped 04/20/2014 because patient converted to sinus rhythm.  Metoprolol increased to 50 mg BID per cardio. She however refused PO metoprolol so we switched to 5 mg IV every 6 hours on 04/23/2014.   Since patient is being discharged to skilled nursing facility will change metoprolol to 50 mg by mouth twice a day.  Patient will nee nuclear study done once acute infection clears. This will have to be arranged outpatient.   Hyperosmolar non-ketotic state in patient with type 2 diabetes mellitus / high anion gap metabolic acidosis  Anion gap 25 on admission. Anion gap now closed.  Continue current insulin regimen Lantus 10 units at bedtime along with NovoLog 4 units 3 times daily with meals and sliding scale insulin.  Diarrhea  C.diff negative. Put temporary flexiseal to prevent skin damage from  diarrhea. Flexiseal out. May use lomotil as needed.    Elevated brain natriuretic peptide (BNP) level / elevated troponin  2-D echocardiogram shows preserved ejection fraction.  Troponin elevation likely from demand ischemia. Troponin level trended down, last 2 sets: 0.27 --> 0.21  Pt initially on heparin but this was stopped 04/20/2014.Marland Kitchen.  Continue beta blockers as indicated above.  Yeast dermatitis  Lamisil cream ordered.   History of thyroid disease  TSH is within normal limits.   Hypokalemia  Magnesium level within normal limits. Potassium supplemented and within normal limits.   Hypernatremia  Unclear etiology perhaps due to renal insufficiency. It is stable since past 24 hours. Added low dose of Lasix for next 10 days on discharge which hopefully will help bring the sodium level down. Patient was given low rate IV fluids overnight.   Rhabdomyolysis  Continue low rate IV fluids because of high BNP level.  CPK down from 1760 to 747 to WNL.    AKI (acute kidney injury)  Likely secondary to sepsis and rhabdomyolysis versus possible chronic kidney disease but we do not have previous values for comparison.  Creatinine has improved with IV fluids from 2.73 to 1.59.   Altered mental status / metabolic encephalopathy  Unclear etiology. Pt still confused, altered mental status. Patient's husband at the bedside reported that mental status changes or chronic and she has been confused and disoriented even prior to this admission.   DVT Prophylaxis  Heparin subcutaneous for DVT prophylaxis while patient is in the hospital  Code Status: Full. Family Communication: Family at the bedside, updated 04/24/2014. Tried calling to update about d/c plan, [phone] and [phone] but no answer and no option to leave VM    IV access:   Peripheral IV  Procedures and diagnostic studies:    Ct Abdomen Pelvis Wo Contrast 04/18/2014: 1. Right lower lobe pneumonia  noted; this likely explains the patient's symptoms. 2. Cholelithiasis; gallbladder otherwise unremarkable. 3. Small to moderate umbilical hernia, containing only fat. 4. Mildly enlarged fibroid uterus noted. Electronically Signed By: Roanna RaiderJeffery Chang M.D. On: 04/18/2014 05:07   Dg Chest 2 View 04/17/2014: Elevated right hemidiaphragm with associated right basilar atelectasis. No consolidation or significant edema. Electronically Signed By: Roxy HorsemanBill Veazey M.D. On: 04/17/2014 21:36   Dg Lumbar Spine 2-3 Views 04/17/2014: 1. No definite acute traumatic injury within the lumbar spine. 2. Anterior wedging of the L1 and L2 vertebral bodies, favored to be chronic in nature. Possible acute compression fractures are not entirely excluded. Correlation with site of pain recommended.   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 No fracture or dislocation. Electronically Signed By: Bretta BangWilliam Woodruff M.D. On: 04/17/2014 21:37   Dg Tibia/fibula Left 04/17/2014: No evidence of fracture or dislocation.  Medical Consultants:   Cardiology  Other Consultants:   None  IAnti-Infectives:    Vancomycin 04/17/14---> 04/18/14  Zosyn 04/17/14---> 04/18/14  Levaquin 04/18/14---> 04/25/2014    Signed:  Manson PasseyEVINE, [place], MD  [facility] 04/25/2014, 9:42 AM  Pager #: [phone]    Discharge Exam: Filed Vitals:   04/25/14 0521  BP: 136/67  Pulse: 85  Temp: 99 F (37.2 C)  Resp: 18   Filed Vitals:   04/24/14 1306  04/24/14 1746 04/24/14 2053 04/25/14 0521  BP: 134/79 133/76 131/70 136/67  Pulse: 76 82 68 85  Temp: 98.6 F (37 C)  98 F (36.7 C) 99 F (37.2 C)  TempSrc: Oral  Oral Oral  Resp: 20  20 18   Height:      Weight:      SpO2: 96%  94% 93%    General: Pt is not in acute distress, disoriented  Cardiovascular: Regular rate and rhythm, S1/S2 (+) Respiratory: no  wheezing, no crackles, no rhonchi Abdominal: non distended, bowel sounds +, no guarding Extremities: no cyanosis, pulses palpable bilaterally DP and PT Neuro: Grossly nonfocal  Discharge Instructions  Discharge Instructions    Ambulatory referral to Nutrition and Diabetic Education    Complete by:  As directed      Call MD for:  difficulty breathing, headache or visual disturbances    Complete by:  As directed      Call MD for:  severe uncontrolled pain    Complete by:  As directed      Diet - low sodium heart healthy    Complete by:  As directed      Discharge instructions    Complete by:  As directed   Per cardiology - Elevated troponin-felt secondary to atrial fibrillation, pneumonia, rhabdomyolysis and renal insufficiency with probable demand ischemia rather than ACS. Would favor nuclear study when she recovers from her present illness. This could be performed as an outpatient.  Please have patient follow up with cardiology in 1-2 weeks after discharge to set up nuclear study.  Please recheck a BMP in the next 2-3 days. Follow-up sodium level. Sodium was 154 prior to discharge. We added low dose lasix which should help lower sodium level. Potassium was within normal limits prior to discharge. Also, recheck creatinine. Her creatinine is 1.74 prior to discharge.     Increase activity slowly    Complete by:  As directed             Medication List    TAKE these medications        acetaminophen 325 MG tablet  Commonly known as:  TYLENOL  Take 2 tablets (650 mg total) by mouth every 6 (six) hours as needed for mild pain (or Fever >/= 101).     aspirin 81 MG chewable tablet  Chew 1 tablet (81 mg total) by mouth daily.     diazepam 5 MG/ML injection  Commonly known as:  VALIUM  Inject 1 mL (5 mg total) into the vein every 4 (four) hours as needed.     diphenoxylate-atropine 2.5-0.025 MG per tablet  Commonly known as:  LOMOTIL  Take 1 tablet by mouth 4 (four) times daily as  needed for diarrhea or loose stools.     furosemide 20 MG tablet  Commonly known as:  LASIX  Take 1 tablet (20 mg total) by mouth daily.     insulin aspart 100 UNIT/ML injection  Commonly known as:  novoLOG  Inject 4 Units into the skin 3 (three) times daily with meals.     insulin aspart 100 UNIT/ML injection  Commonly known as:  novoLOG  Inject 0-15 Units into the skin 3 (three) times daily with meals.     insulin glargine 100 UNIT/ML injection  Commonly known as:  LANTUS  Inject 0.1 mLs (10 Units total) into the skin at bedtime.     magic mouthwash w/lidocaine Soln  Take 2 mLs by mouth 4 (four) times daily.  metoprolol 50 MG tablet  Commonly known as:  LOPRESSOR  Take 1 tablet (50 mg total) by mouth 2 (two) times daily.     ondansetron 4 MG tablet  Commonly known as:  ZOFRAN  Take 1 tablet (4 mg total) by mouth every 6 (six) hours as needed for nausea.     polyethylene glycol packet  Commonly known as:  MIRALAX / GLYCOLAX  Take 17 g by mouth daily as needed for mild constipation.     terbinafine 1 % cream  Commonly known as:  LAMISIL  Apply topically 2 (two) times daily.           The results of significant diagnostics from this hospitalization (including imaging, microbiology, ancillary and laboratory) are listed below for reference.    Significant Diagnostic Studies: Ct Abdomen Pelvis Wo Contrast  04/18/2014   CLINICAL DATA:  Status post fall; found on floor. Sepsis, with progressive weakness for several weeks. Erythema at the groin and abdominal wall. Leukocytosis. Initial encounter.  EXAM: CT ABDOMEN AND PELVIS WITHOUT CONTRAST  TECHNIQUE: Multidetector CT imaging of the abdomen and pelvis was performed following the standard protocol without IV contrast.  COMPARISON:  Lumbar spine radiograph performed 04/17/2014  FINDINGS: Right lower lobe airspace opacification is compatible with pneumonia.  The liver and spleen are unremarkable in appearance. Scattered  stones are seen within the gallbladder. The gallbladder is otherwise unremarkable. The pancreas and adrenal glands are unremarkable.  Nonspecific perinephric stranding is noted bilaterally. Mild right-sided pelvicaliectasis remains within normal limits. There is no evidence of hydronephrosis. No renal or ureteral stones are seen.  No free fluid is identified. The small bowel is unremarkable in appearance. The stomach is within normal limits. No acute vascular abnormalities are seen. Minimal calcification is seen along the abdominal aorta and its branches.  The appendix is not definitely seen; there is no evidence for appendicitis. The colon is unremarkable in appearance.  A small-to-moderate umbilical hernia is noted, containing only fat.  The bladder is largely decompressed and grossly unremarkable. A large 8.0 cm fibroid is suspected within the uterus. The uterus is mildly enlarged. The ovaries are grossly symmetric. No suspicious adnexal masses are seen. Stop No inguinal lymphadenopathy is seen.  No acute osseous abnormalities are identified.  IMPRESSION: 1. Right lower lobe pneumonia noted; this likely explains the patient's symptoms. 2. Cholelithiasis; gallbladder otherwise unremarkable. 3. Small to moderate umbilical hernia, containing only fat. 4. Mildly enlarged fibroid uterus noted.   Electronically Signed   By: Roanna Raider M.D.   On: 04/18/2014 05:07   Dg Chest 2 View  04/17/2014   CLINICAL DATA:  Pt arrived via EMS with report of falling without injury d/t "legs given out" on concrete garage floor since 1300 and was found by spouse ast 1845. EMS reported that CBG was greater than 600, poor appetite, noncompliant with diet d/t noted soft drinks and sweeten tea in house, strong urine smell, and red rash to abd folds. Pt c/o lower back pain, large bruising to left hip and groin, denies any chest complaints, best obtainable images due to pt size and condition  EXAM: CHEST  2 VIEW  COMPARISON:  None.   FINDINGS: There are low lung volumes with patient rotation to the right. The lateral view is limited. There is elevation of the right hemidiaphragm. The aorta appears ectatic and somewhat unfolded. The heart size is normal. Mild atelectasis is present at the right lung base. There is no consolidation, significant pleural effusion or pneumothorax. No  acute osseous findings identified.  IMPRESSION: Elevated right hemidiaphragm with associated right basilar atelectasis. No consolidation or significant edema.   Electronically Signed   By: Roxy Horseman M.D.   On: 04/17/2014 21:36   Dg Lumbar Spine 2-3 Views  04/17/2014   CLINICAL DATA:  Fall.  Weakness.  Initial evaluation.  EXAM: LUMBAR SPINE - 2-3 VIEW  COMPARISON:  None.  FINDINGS: Five lumbar type vertebral bodies are present. There is mild levoscoliosis. Vertebral bodies are otherwise normally aligned with preservation of the normal lumbar lordosis.  There is anterior wedging of the L1 and L2 vertebral bodies, favored to be chronic in nature. Vertebral body heights are otherwise preserved. No definite acute fracture listhesis.  No soft tissue abnormality.  Moderate multilevel degenerative disc disease present within the visualized spine.  IMPRESSION: 1. No definite acute traumatic injury within the lumbar spine. 2. Anterior wedging of the L1 and L2 vertebral bodies, favored to be chronic in nature. Possible acute compression fractures are not entirely excluded. Correlation with site of pain recommended.   Electronically Signed   By: Rise Mu M.D.   On: 04/17/2014 21:37   Dg Pelvis 1-2 Views  04/17/2014   CLINICAL DATA:  Patient fell on concrete floor earlier in the day  EXAM: PELVIS - 1-2 VIEW  COMPARISON:  None.  FINDINGS: There is no evidence of pelvic fracture or dislocation. 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 No erosive change.  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 No fracture or dislocation.   Electronically Signed   By: Bretta Bang M.D.   On: 04/17/2014 21:37   Dg Tibia/fibula Left  04/17/2014   CLINICAL DATA:  Status post fall; acute onset of left lower leg pain. Initial encounter.  EXAM: LEFT TIBIA AND FIBULA - 2 VIEW  COMPARISON:  None.  FINDINGS: There is no evidence of fracture or dislocation. The tibia and fibula appear intact.  Marginal osteophytes are seen arising at the medial and lateral compartments of the knee, with wall osteophytes also seen. No knee joint effusion is identified.  The ankle mortise is incompletely assessed, but appears grossly unremarkable. A plantar calcaneal spur is incidentally noted. No significant soft tissue abnormalities are identified.  IMPRESSION: No evidence of fracture or dislocation.   Electronically Signed   By: Roanna Raider M.D.   On: 04/17/2014 22:52    Microbiology: Recent Results (from the past 240 hour(s))  Urine culture     Status: None   Collection Time: 04/17/14  8:47 PM  Result Value Ref Range Status   Specimen Description URINE, CATHETERIZED  Final   Special Requests Normal  Final   Culture  Setup Time   Final    04/18/2014 04:23 Performed at [facility]    Colony Count NO GROWTH Performed at [facility]   Final   Culture NO GROWTH Performed at [facility]   Final   Report Status 04/19/2014 FINAL  Final  MRSA PCR Screening     Status: None   Collection Time: 04/18/14  3:55 AM  Result Value Ref Range Status   MRSA by PCR NEGATIVE NEGATIVE Final    Comment:        The GeneXpert MRSA Assay (FDA approved for NASAL specimens only), is one component of a comprehensive MRSA colonization surveillance program. It is not intended to  diagnose MRSA infection nor to guide or monitor treatment for MRSA infections.   Clostridium Difficile by PCR     Status: None   Collection Time: 04/23/14   7:07 AM  Result Value Ref Range Status   C difficile by pcr NEGATIVE NEGATIVE Final    Comment: Performed at [facility]     Labs: Basic Metabolic Panel:  Recent Labs Lab 04/19/14 0359 04/20/14 0150 04/22/14 0533 04/24/14 0404 04/25/14 0715  NA 142 144 144 154* 154*  K 3.9 3.1* 3.0* 3.1* 3.8  CL 111 115* 116* 123* 122*  CO2 22 24 23 26 27   GLUCOSE 198* 218* 141* 128* 104*  BUN 27* 24* 18 17 16   CREATININE 2.67* 2.06* 1.53* 1.59* 1.74*  CALCIUM 7.3* 7.7* 8.1* 8.5 8.1*  MG 1.8  --   --   --   --    Liver Function Tests: No results for input(s): AST, ALT, ALKPHOS, BILITOT, PROT, ALBUMIN in the last 168 hours. No results for input(s): LIPASE, AMYLASE in the last 168 hours. No results for input(s): AMMONIA in the last 168 hours. CBC:  Recent Labs Lab 04/19/14 0359 04/20/14 0150 04/22/14 0533 04/24/14 0404  WBC 21.0* 12.7* 10.8* 13.7*  HGB 11.8* 11.1* 11.4* 12.0  HCT 34.8* 33.5* 34.4* 37.2  MCV 88.5 90.1 89.8 91.4  PLT 246 235 229 201   Cardiac Enzymes:  Recent Labs Lab 04/18/14 1500 04/18/14 2123 04/19/14 0359 04/20/14 0150 04/22/14 0533  CKTOTAL  --   --  1760* 747* 96  TROPONINI 0.28* 0.21*  --   --   --    BNP: BNP (last 3 results)  Recent Labs  04/17/14 2033  PROBNP 7693.0*   CBG:  Recent Labs Lab 04/24/14 0758 04/24/14 1301 04/24/14 1734 04/24/14 2131 04/25/14 0717  GLUCAP 126* 154* 191* 92 97    Time coordinating discharge: Over 30 minutes

## 2014-04-25 NOTE — Progress Notes (Signed)
Subjective:  Remains very confused; no shortness of breath  Objective:   Vital Signs in the last 24 hours: Temp:  [98 F (36.7 C)-99 F (37.2 C)] 99 F (37.2 C) (12/29 0521) Pulse Rate:  [68-85] 85 (12/29 0521) Resp:  [18-20] 18 (12/29 0521) BP: (131-136)/(67-79) 136/67 mmHg (12/29 0521) SpO2:  [93 %-96 %] 93 % (12/29 0521)  Intake/Output from previous day: 12/28 0701 - 12/29 0700 In: 325.5 [I.V.:225.5; IV Piggyback:100] Out: 1650 [Urine:1650]   I/O since admission:  +[phone]  Medications: . antiseptic oral rinse  7 mL Mouth Rinse q12n4p  . aspirin  81 mg Oral Daily  . chlorhexidine  15 mL Mouth Rinse BID  . heparin subcutaneous  5,000 Units Subcutaneous 3 times per day  . insulin aspart  0-15 Units Subcutaneous TID WC  . insulin aspart  0-5 Units Subcutaneous QHS  . insulin aspart  4 Units Subcutaneous TID WC  . insulin glargine  10 Units Subcutaneous QHS  . levofloxacin (LEVAQUIN) IV  750 mg Intravenous Q48H  . magic mouthwash w/lidocaine  2 mL Oral QID  . metoprolol  5 mg Intravenous 4 times per day  . sodium chloride  3 mL Intravenous Q12H  . terbinafine   Topical BID       Physical Exam:   General appearance: Confused; no distress Neck: no adenopathy, no carotid bruit, no JVD, supple, symmetrical, trachea midline and thyroid not enlarged, symmetric, no tenderness/mass/nodules Lungs: clear to auscultation bilaterally Heart: regular rate and rhythm Abdomen: soft, non-tender; bowel sounds normal; no masses,  no organomegaly Extremities: no edema, redness or tenderness in the calves or thighs   Rate: 78  Rhythm: normal sinus rhythm with PVC's  Lab Results:   Recent Labs  04/24/14 0404 04/25/14 0715  NA 154* 154*  K 3.1* 3.8  CL 123* 122*  CO2 26 27  GLUCOSE 128* 104*  BUN 17 16  CREATININE 1.59* 1.74*   CBC Latest Ref Rng 04/24/2014 04/22/2014 04/20/2014  WBC 4.0 - 10.5 K/uL 13.7(H) 10.8(H) 12.7(H)  Hemoglobin 12.0 - 15.0 g/dL 16.112.0 11.4(L)  11.1(L)  Hematocrit 36.0 - 46.0 % 37.2 34.4(L) 33.5(L)  Platelets 150 - 400 K/uL 201 229 235    No results for input(s): TROPONINI in the last 72 hours.  Invalid input(s): CK, MB  Hepatic Function Panel No results for input(s): PROT, ALBUMIN, AST, ALT, ALKPHOS, BILITOT, BILIDIR, IBILI in the last 72 hours. No results for input(s): INR in the last 72 hours. BNP (last 3 results)  Recent Labs  04/17/14 2033  PROBNP 7693.0*    Lipid Panel  No results found for: CHOL, TRIG, HDL, CHOLHDL, VLDL, LDLCALC, LDLDIRECT    Imaging:  Study Conclusions  - Left ventricle: The cavity size was normal. Wall thickness was normal. Systolic function was normal. The estimated ejection fraction was in the range of 55% to 60%. - Left atrium: The atrium was mildly dilated.   Assessment/Plan:   Principal Problem:   Sepsis Active Problems:   Hyperosmolar non-ketotic state in patient with type 2 diabetes mellitus   Elevated brain natriuretic peptide (BNP) level   Atrial fibrillation with RVR   Leukocytosis   Hypokalemia   Rhabdomyolysis   AKI (acute kidney injury)   High anion gap metabolic acidosis   Lactic acidosis   Altered mental status   Elevated troponin   Yeast dermatitis   1. Paroxysmal atrial fibrillation-  She remains in sinus rhthym; ectopy is improved but still with PVC's. Now getting metoprolol  5 mg IV four times daily; resume oral when taking po. 2. Elevated troponin-felt secondary to atrial fibrillation, pneumonia, rhabdomyolysis and renal insufficiency with probable demand ischemia rather than ACS. Would favor nuclear study when she recovers from her present illness. This could be performed as an outpatient. 3. Pneumonia-currently on levaquin; WBC 13.7 today 4. Electrolyte imbalance with hypernatremia Na 154 again today. K better at 3.8  5. Confusion: no improvement 6. Acute on chronic stage III renal failure-renal function; Cr increased to 1.74 today 7. Initial BNP  elevation: will re check today     Natalie Biharihomas A. Vermon Grays, MD, [facility] HospitalFACC 04/25/2014, 8:26 AM

## 2014-04-25 NOTE — Progress Notes (Signed)
Patient is set to [facility]. Patient & brother at bedside aware. Discharge packet given to RN, Victorino DikeJennifer. [facility] called for transport.   Clinical Social Work Department CLINICAL SOCIAL WORK PLACEMENT NOTE 04/25/2014  Patient:  [name],Natalie C  Account Number:  [id number] Admit date:  04/17/2014  Clinical Social Worker:  Orpah GreekKELLY FOLEY, LCSWA  Date/time:  04/24/2014 04:19 PM  Clinical Social Work is seeking post-discharge placement for this patient at the following level of care:   SKILLED NURSING   (*CSW will update this form in Epic as items are completed)   04/24/2014  Patient/family provided with [facility] Department of Clinical Social Work's list of facilities offering this level of care within the geographic area requested by the patient (or if unable, by the patient's family).  04/24/2014  Patient/family informed of their freedom to choose among providers that offer the needed level of care, that participate in Medicare, Medicaid or managed care program needed by the patient, have an available bed and are willing to accept the patient.  04/24/2014  Patient/family informed of [facility]' ownership interest in [facility], as well as of the fact that they are under no obligation to receive care at this facility.  PASARR submitted to EDS on 04/24/2014 PASARR number received on 04/24/2014  FL2 transmitted to all facilities in geographic area requested by pt/family on  04/24/2014 FL2 transmitted to all facilities within larger geographic area on   Patient informed that his/her managed care company has contracts with or will negotiate with  certain facilities, including the following:     Patient/family informed of bed offers received:  04/25/2014 [facility] Physician recommends and patient chooses bed at    [facility]   04/25/2014 Patient to be transferred to facility by [facility] Patient and family notified of transfer on 04/25/2014 Name of family member notified:  patient's brother at bedside  The following physician request were entered in Epic:   Additional Comments:    Lincoln MaxinKelly Kaelen Caughlin, LCSW [facility] Clinical Social Worker cell #: [phone]

## 2014-04-25 NOTE — Progress Notes (Signed)
[facility]. RN accepting report for the facility. VSS and No changes noted with Pt's assessment at time of discharge.

## 2014-04-27 ENCOUNTER — Encounter: Payer: Self-pay | Admitting: Internal Medicine

## 2014-04-27 ENCOUNTER — Inpatient Hospital Stay (HOSPITAL_COMMUNITY)
Admission: EM | Admit: 2014-04-27 | Discharge: 2014-05-17 | DRG: 004 | Disposition: A | Discharge status: Other Acute Inpatient Hospital | Payer: Medicare Other | Attending: Pulmonary Disease | Admitting: Pulmonary Disease

## 2014-04-27 ENCOUNTER — Encounter (HOSPITAL_COMMUNITY): Payer: Self-pay | Admitting: Emergency Medicine

## 2014-04-27 ENCOUNTER — Emergency Department (HOSPITAL_COMMUNITY): Payer: Medicare Other

## 2014-04-27 ENCOUNTER — Non-Acute Institutional Stay (SKILLED_NURSING_FACILITY): Payer: Medicare Other | Admitting: Internal Medicine

## 2014-04-27 DIAGNOSIS — I63412 Cerebral infarction due to embolism of left middle cerebral artery: Secondary | ICD-10-CM | POA: Diagnosis present

## 2014-04-27 DIAGNOSIS — I509 Heart failure, unspecified: Secondary | ICD-10-CM | POA: Diagnosis present

## 2014-04-27 DIAGNOSIS — Y95 Nosocomial condition: Secondary | ICD-10-CM | POA: Diagnosis not present

## 2014-04-27 DIAGNOSIS — E785 Hyperlipidemia, unspecified: Secondary | ICD-10-CM | POA: Diagnosis not present

## 2014-04-27 DIAGNOSIS — E878 Other disorders of electrolyte and fluid balance, not elsewhere classified: Secondary | ICD-10-CM | POA: Diagnosis not present

## 2014-04-27 DIAGNOSIS — E876 Hypokalemia: Secondary | ICD-10-CM

## 2014-04-27 DIAGNOSIS — I129 Hypertensive chronic kidney disease with stage 1 through stage 4 chronic kidney disease, or unspecified chronic kidney disease: Secondary | ICD-10-CM | POA: Diagnosis present

## 2014-04-27 DIAGNOSIS — E1100 Type 2 diabetes mellitus with hyperosmolarity without nonketotic hyperglycemic-hyperosmolar coma (NKHHC): Secondary | ICD-10-CM

## 2014-04-27 DIAGNOSIS — J189 Pneumonia, unspecified organism: Secondary | ICD-10-CM | POA: Diagnosis not present

## 2014-04-27 DIAGNOSIS — R4182 Altered mental status, unspecified: Secondary | ICD-10-CM | POA: Diagnosis present

## 2014-04-27 DIAGNOSIS — L304 Erythema intertrigo: Secondary | ICD-10-CM | POA: Diagnosis not present

## 2014-04-27 DIAGNOSIS — G9341 Metabolic encephalopathy: Secondary | ICD-10-CM | POA: Diagnosis not present

## 2014-04-27 DIAGNOSIS — Z79899 Other long term (current) drug therapy: Secondary | ICD-10-CM

## 2014-04-27 DIAGNOSIS — N183 Chronic kidney disease, stage 3 unspecified: Secondary | ICD-10-CM | POA: Diagnosis present

## 2014-04-27 DIAGNOSIS — D649 Anemia, unspecified: Secondary | ICD-10-CM | POA: Diagnosis not present

## 2014-04-27 DIAGNOSIS — J9601 Acute respiratory failure with hypoxia: Secondary | ICD-10-CM

## 2014-04-27 DIAGNOSIS — J690 Pneumonitis due to inhalation of food and vomit: Secondary | ICD-10-CM | POA: Diagnosis not present

## 2014-04-27 DIAGNOSIS — Z7982 Long term (current) use of aspirin: Secondary | ICD-10-CM

## 2014-04-27 DIAGNOSIS — R579 Shock, unspecified: Secondary | ICD-10-CM

## 2014-04-27 DIAGNOSIS — Z794 Long term (current) use of insulin: Secondary | ICD-10-CM

## 2014-04-27 DIAGNOSIS — I63411 Cerebral infarction due to embolism of right middle cerebral artery: Secondary | ICD-10-CM | POA: Diagnosis not present

## 2014-04-27 DIAGNOSIS — R4701 Aphasia: Secondary | ICD-10-CM | POA: Diagnosis present

## 2014-04-27 DIAGNOSIS — A499 Bacterial infection, unspecified: Secondary | ICD-10-CM

## 2014-04-27 DIAGNOSIS — B372 Candidiasis of skin and nail: Secondary | ICD-10-CM | POA: Diagnosis not present

## 2014-04-27 DIAGNOSIS — A419 Sepsis, unspecified organism: Secondary | ICD-10-CM | POA: Diagnosis not present

## 2014-04-27 DIAGNOSIS — E870 Hyperosmolality and hypernatremia: Secondary | ICD-10-CM | POA: Diagnosis present

## 2014-04-27 DIAGNOSIS — I6340 Cerebral infarction due to embolism of unspecified cerebral artery: Secondary | ICD-10-CM

## 2014-04-27 DIAGNOSIS — E860 Dehydration: Secondary | ICD-10-CM | POA: Diagnosis not present

## 2014-04-27 DIAGNOSIS — R1314 Dysphagia, pharyngoesophageal phase: Secondary | ICD-10-CM | POA: Diagnosis not present

## 2014-04-27 DIAGNOSIS — I63311 Cerebral infarction due to thrombosis of right middle cerebral artery: Secondary | ICD-10-CM | POA: Diagnosis not present

## 2014-04-27 DIAGNOSIS — R6521 Severe sepsis with septic shock: Secondary | ICD-10-CM | POA: Diagnosis not present

## 2014-04-27 DIAGNOSIS — N390 Urinary tract infection, site not specified: Secondary | ICD-10-CM | POA: Diagnosis not present

## 2014-04-27 DIAGNOSIS — R930 Abnormal findings on diagnostic imaging of skull and head, not elsewhere classified: Secondary | ICD-10-CM

## 2014-04-27 DIAGNOSIS — I4891 Unspecified atrial fibrillation: Secondary | ICD-10-CM

## 2014-04-27 DIAGNOSIS — I639 Cerebral infarction, unspecified: Secondary | ICD-10-CM

## 2014-04-27 DIAGNOSIS — B9620 Unspecified Escherichia coli [E. coli] as the cause of diseases classified elsewhere: Secondary | ICD-10-CM | POA: Diagnosis not present

## 2014-04-27 DIAGNOSIS — T501X5A Adverse effect of loop [high-ceiling] diuretics, initial encounter: Secondary | ICD-10-CM | POA: Diagnosis not present

## 2014-04-27 DIAGNOSIS — N179 Acute kidney failure, unspecified: Secondary | ICD-10-CM | POA: Diagnosis not present

## 2014-04-27 DIAGNOSIS — Z4659 Encounter for fitting and adjustment of other gastrointestinal appliance and device: Secondary | ICD-10-CM

## 2014-04-27 DIAGNOSIS — R064 Hyperventilation: Secondary | ICD-10-CM

## 2014-04-27 DIAGNOSIS — G9340 Encephalopathy, unspecified: Secondary | ICD-10-CM

## 2014-04-27 DIAGNOSIS — I63312 Cerebral infarction due to thrombosis of left middle cerebral artery: Secondary | ICD-10-CM | POA: Diagnosis present

## 2014-04-27 DIAGNOSIS — Z6841 Body Mass Index (BMI) 40.0 and over, adult: Secondary | ICD-10-CM | POA: Diagnosis not present

## 2014-04-27 DIAGNOSIS — I6330 Cerebral infarction due to thrombosis of unspecified cerebral artery: Secondary | ICD-10-CM | POA: Insufficient documentation

## 2014-04-27 DIAGNOSIS — R9089 Other abnormal findings on diagnostic imaging of central nervous system: Secondary | ICD-10-CM | POA: Insufficient documentation

## 2014-04-27 DIAGNOSIS — E639 Nutritional deficiency, unspecified: Secondary | ICD-10-CM

## 2014-04-27 DIAGNOSIS — R4020 Unspecified coma: Secondary | ICD-10-CM | POA: Diagnosis not present

## 2014-04-27 DIAGNOSIS — E119 Type 2 diabetes mellitus without complications: Secondary | ICD-10-CM | POA: Insufficient documentation

## 2014-04-27 DIAGNOSIS — Z978 Presence of other specified devices: Secondary | ICD-10-CM

## 2014-04-27 DIAGNOSIS — J9600 Acute respiratory failure, unspecified whether with hypoxia or hypercapnia: Secondary | ICD-10-CM | POA: Insufficient documentation

## 2014-04-27 DIAGNOSIS — R531 Weakness: Secondary | ICD-10-CM

## 2014-04-27 DIAGNOSIS — R509 Fever, unspecified: Secondary | ICD-10-CM

## 2014-04-27 DIAGNOSIS — J9690 Respiratory failure, unspecified, unspecified whether with hypoxia or hypercapnia: Secondary | ICD-10-CM

## 2014-04-27 DIAGNOSIS — Z9289 Personal history of other medical treatment: Secondary | ICD-10-CM

## 2014-04-27 HISTORY — DX: Unspecified atrial fibrillation: I48.91

## 2014-04-27 HISTORY — DX: Type 2 diabetes mellitus with hyperosmolarity without nonketotic hyperglycemic-hyperosmolar coma (NKHHC): E11.00

## 2014-04-27 HISTORY — DX: Rhabdomyolysis: M62.82

## 2014-04-27 HISTORY — DX: Type 2 diabetes mellitus without complications: E11.9

## 2014-04-27 HISTORY — DX: Acute kidney failure, unspecified: N17.9

## 2014-04-27 HISTORY — DX: Sepsis, unspecified organism: A41.9

## 2014-04-27 LAB — URINE MICROSCOPIC-ADD ON

## 2014-04-27 LAB — CBC WITH DIFFERENTIAL/PLATELET
BASOS ABS: 0 10*3/uL (ref 0.0–0.1)
BASOS PCT: 0 % (ref 0–1)
EOS ABS: 0.1 10*3/uL (ref 0.0–0.7)
EOS PCT: 1 % (ref 0–5)
HCT: 40 % (ref 36.0–46.0)
HEMOGLOBIN: 12.2 g/dL (ref 12.0–15.0)
LYMPHS PCT: 8 % — AB (ref 12–46)
Lymphs Abs: 1.5 10*3/uL (ref 0.7–4.0)
MCH: 29.3 pg (ref 26.0–34.0)
MCHC: 30.5 g/dL (ref 30.0–36.0)
MCV: 95.9 fL (ref 78.0–100.0)
MONOS PCT: 4 % (ref 3–12)
Monocytes Absolute: 0.8 10*3/uL (ref 0.1–1.0)
NEUTROS ABS: 16.3 10*3/uL — AB (ref 1.7–7.7)
Neutrophils Relative %: 87 % — ABNORMAL HIGH (ref 43–77)
PLATELETS: 181 10*3/uL (ref 150–400)
RBC: 4.17 MIL/uL (ref 3.87–5.11)
RDW: 16.7 % — ABNORMAL HIGH (ref 11.5–15.5)
WBC: 18.8 10*3/uL — AB (ref 4.0–10.5)

## 2014-04-27 LAB — URINALYSIS, ROUTINE W REFLEX MICROSCOPIC
Bilirubin Urine: NEGATIVE
Glucose, UA: NEGATIVE mg/dL
KETONES UR: NEGATIVE mg/dL
NITRITE: NEGATIVE
PH: 5 (ref 5.0–8.0)
Protein, ur: NEGATIVE mg/dL
Specific Gravity, Urine: 1.009 (ref 1.005–1.030)
UROBILINOGEN UA: 0.2 mg/dL (ref 0.0–1.0)

## 2014-04-27 LAB — GLUCOSE, CAPILLARY: Glucose-Capillary: 89 mg/dL (ref 70–99)

## 2014-04-27 LAB — BASIC METABOLIC PANEL
ANION GAP: 9 (ref 5–15)
BUN: 17 mg/dL (ref 6–23)
CO2: 29 mmol/L (ref 19–32)
CREATININE: 1.66 mg/dL — AB (ref 0.50–1.10)
Calcium: 8.7 mg/dL (ref 8.4–10.5)
Chloride: 128 mEq/L — ABNORMAL HIGH (ref 96–112)
Creatinine: 1.7 mg/dL — AB (ref 0.5–1.1)
GFR, EST AFRICAN AMERICAN: 35 mL/min — AB (ref >90)
GFR, EST NON AFRICAN AMERICAN: 30 mL/min — AB (ref >90)
Glucose, Bld: 102 mg/dL — ABNORMAL HIGH (ref 70–99)
Potassium: 3.2 mmol/L — ABNORMAL LOW (ref 3.5–5.1)
Sodium: 169 mmol/L — AB (ref 137–147)
Sodium: 166 mmol/L (ref 135–145)

## 2014-04-27 LAB — HEPATIC FUNCTION PANEL
ALT: 17 U/L (ref 0–35)
AST: 33 U/L (ref 0–37)
Albumin: 2.5 g/dL — ABNORMAL LOW (ref 3.5–5.2)
Alkaline Phosphatase: 315 U/L — ABNORMAL HIGH (ref 39–117)
BILIRUBIN DIRECT: 0.2 mg/dL (ref 0.0–0.3)
BILIRUBIN INDIRECT: 0.5 mg/dL (ref 0.3–0.9)
BILIRUBIN TOTAL: 0.7 mg/dL (ref 0.3–1.2)
Total Protein: 6.9 g/dL (ref 6.0–8.3)

## 2014-04-27 LAB — MRSA PCR SCREENING: MRSA BY PCR: NEGATIVE

## 2014-04-27 LAB — I-STAT CG4 LACTIC ACID, ED: Lactic Acid, Venous: 2.52 mmol/L — ABNORMAL HIGH (ref 0.5–2.2)

## 2014-04-27 LAB — CK: CK TOTAL: 43 U/L (ref 7–177)

## 2014-04-27 LAB — CBG MONITORING, ED: Glucose-Capillary: 105 mg/dL — ABNORMAL HIGH (ref 70–99)

## 2014-04-27 LAB — TROPONIN I: Troponin I: 0.06 ng/mL — ABNORMAL HIGH (ref <0.031)

## 2014-04-27 MED ORDER — ALUM & MAG HYDROXIDE-SIMETH 200-200-20 MG/5ML PO SUSP: 30.0000 mL | Freq: Four times a day (QID) | ORAL | Status: DC | PRN

## 2014-04-27 MED ORDER — METOPROLOL TARTRATE 50 MG PO TABS: 50.0000 mg | ORAL_TABLET | Freq: Two times a day (BID) | ORAL | Status: DC

## 2014-04-27 MED ORDER — POTASSIUM CHLORIDE IN NACL 20-0.9 MEQ/L-% IV SOLN
INTRAVENOUS | Status: DC
  Administered 2014-04-27: 21:00:00 via INTRAVENOUS
  Filled 2014-04-27 (×2): qty 1000

## 2014-04-27 MED ORDER — PNEUMOCOCCAL VAC POLYVALENT 25 MCG/0.5ML IJ INJ
0.5000 mL | INJECTION | INTRAMUSCULAR | Status: AC
  Administered 2014-04-28: 0.5 mL via INTRAMUSCULAR
  Filled 2014-04-27: qty 0.5

## 2014-04-27 MED ORDER — LORAZEPAM 2 MG/ML IJ SOLN: 0.5000 mg | Freq: Once | INTRAMUSCULAR | Status: DC | PRN

## 2014-04-27 MED ORDER — SODIUM CHLORIDE 0.9 % IJ SOLN
3.0000 mL | Freq: Two times a day (BID) | INTRAMUSCULAR | Status: DC
  Administered 2014-04-28 – 2014-05-01 (×3): 3 mL via INTRAVENOUS

## 2014-04-27 MED ORDER — SODIUM CHLORIDE 0.9 % IV SOLN: INTRAVENOUS | Status: DC

## 2014-04-27 MED ORDER — TERBINAFINE HCL 1 % EX CREA
TOPICAL_CREAM | Freq: Two times a day (BID) | CUTANEOUS | Status: DC
  Administered 2014-04-28 – 2014-05-01 (×8): via TOPICAL
  Administered 2014-05-01: 1 via TOPICAL
  Administered 2014-05-02 (×2): via TOPICAL
  Administered 2014-05-03: 1 via TOPICAL
  Administered 2014-05-03 – 2014-05-04 (×2): via TOPICAL
  Administered 2014-05-04 – 2014-05-05 (×2): 1 via TOPICAL
  Administered 2014-05-05 – 2014-05-06 (×2): via TOPICAL
  Administered 2014-05-06 – 2014-05-07 (×2): 1 via TOPICAL
  Administered 2014-05-07 – 2014-05-08 (×2): via TOPICAL
  Administered 2014-05-08: 1 via TOPICAL
  Administered 2014-05-09 – 2014-05-14 (×11): via TOPICAL
  Administered 2014-05-14: 1 via TOPICAL
  Administered 2014-05-15: 22:00:00 via TOPICAL
  Administered 2014-05-16: 1 via TOPICAL
  Administered 2014-05-17: 10:00:00 via TOPICAL
  Filled 2014-04-27 (×2): qty 36
  Filled 2014-04-27: qty 12
  Filled 2014-04-27 (×4): qty 36

## 2014-04-27 MED ORDER — ACETAMINOPHEN 650 MG RE SUPP
650.0000 mg | Freq: Four times a day (QID) | RECTAL | Status: DC | PRN
  Administered 2014-04-30 – 2014-05-03 (×5): 650 mg via RECTAL
  Filled 2014-04-27 (×5): qty 1

## 2014-04-27 MED ORDER — STROKE: EARLY STAGES OF RECOVERY BOOK
Freq: Once | Status: AC
  Administered 2014-04-28: 05:00:00
  Filled 2014-04-27: qty 1

## 2014-04-27 MED ORDER — ENSURE COMPLETE PO LIQD: 237.0000 mL | Freq: Two times a day (BID) | ORAL | Status: DC

## 2014-04-27 MED ORDER — INSULIN GLARGINE 100 UNIT/ML ~~LOC~~ SOLN
10.0000 [IU] | Freq: Every day | SUBCUTANEOUS | Status: DC
  Filled 2014-04-27: qty 0.1

## 2014-04-27 MED ORDER — ENOXAPARIN SODIUM 60 MG/0.6ML ~~LOC~~ SOLN
60.0000 mg | SUBCUTANEOUS | Status: DC
  Administered 2014-04-28 (×2): 60 mg via SUBCUTANEOUS
  Filled 2014-04-27 (×5): qty 0.6

## 2014-04-27 MED ORDER — SODIUM CHLORIDE 0.9 % IV BOLUS (SEPSIS)
500.0000 mL | Freq: Once | INTRAVENOUS | Status: AC
  Administered 2014-04-27: 500 mL via INTRAVENOUS

## 2014-04-27 MED ORDER — POTASSIUM CHLORIDE IN NACL 20-0.45 MEQ/L-% IV SOLN
INTRAVENOUS | Status: DC
  Administered 2014-04-28: 01:00:00 via INTRAVENOUS
  Filled 2014-04-27 (×5): qty 1000

## 2014-04-27 MED ORDER — ACETAMINOPHEN 325 MG PO TABS: 650.0000 mg | ORAL_TABLET | Freq: Four times a day (QID) | ORAL | Status: DC | PRN

## 2014-04-27 MED ORDER — INSULIN ASPART 100 UNIT/ML ~~LOC~~ SOLN: 0.0000 [IU] | Freq: Three times a day (TID) | SUBCUTANEOUS | Status: DC

## 2014-04-27 MED ORDER — INSULIN ASPART 100 UNIT/ML ~~LOC~~ SOLN: 0.0000 [IU] | Freq: Every day | SUBCUTANEOUS | Status: DC

## 2014-04-27 MED ORDER — ONDANSETRON HCL 4 MG PO TABS: 4.0000 mg | ORAL_TABLET | Freq: Four times a day (QID) | ORAL | Status: DC | PRN

## 2014-04-27 MED ORDER — ONDANSETRON HCL 4 MG/2ML IJ SOLN: 4.0000 mg | Freq: Four times a day (QID) | INTRAMUSCULAR | Status: DC | PRN

## 2014-04-27 MED ORDER — INFLUENZA VAC SPLIT QUAD 0.5 ML IM SUSY
0.5000 mL | PREFILLED_SYRINGE | INTRAMUSCULAR | Status: DC
  Filled 2014-04-27: qty 0.5

## 2014-04-27 MED ORDER — ASPIRIN 81 MG PO CHEW
81.0000 mg | CHEWABLE_TABLET | Freq: Every day | ORAL | Status: DC
  Filled 2014-04-27: qty 1

## 2014-04-27 MED ORDER — SODIUM CHLORIDE 0.9 % IV BOLUS (SEPSIS)
1000.0000 mL | Freq: Once | INTRAVENOUS | Status: AC
  Administered 2014-04-27: 1000 mL via INTRAVENOUS

## 2014-04-27 MED ORDER — POLYETHYLENE GLYCOL 3350 17 G PO PACK
17.0000 g | PACK | Freq: Every day | ORAL | Status: DC | PRN
Start: 2014-04-27 — End: 2014-04-28
  Filled 2014-04-27: qty 1

## 2014-04-27 NOTE — Progress Notes (Signed)
  CARE MANAGEMENT ED NOTE 04/27/2014  Patient:  [name],[name]   Account Number:  [id number]  Date Initiated:  04/27/2014  Documentation initiated by:  Radford PaxFERRERO,Wilbert Hayashi  Subjective/Objective Assessment:   Patient presents to Ed with altered mental status     Subjective/Objective Assessment Detail:   Patient with pmhx of thyroid disease.  Sodium 169.  WBC 18.8  CT of head: Moderate region of decreased density in the left parietal-occipital  lobe concerning for subacute infarct. There is additional  questionable region of decreased density in the right temporal lobe.     Action/Plan:   Action/Plan Detail:   Anticipated DC Date:       Status Recommendation to Physician:   Result of Recommendation:    Other ED Services  Consult Working Plan    DC [employer]  Other  PCP issues    Choice offered to / List presented to:            Status of service:  Completed, signed off  ED Comments:   ED Comments Detail:  Patient admitted and discharged from 12/21 to 12/29 for sepsis, rhabdo, AKI, Afib, leukocytosis, lactic acicdosis and HONK.  [facility] Patient's pcp at nursing facility id Dr. Billey Coifffany Reed. System updated.

## 2014-04-27 NOTE — ED Provider Notes (Signed)
CSN: [id number]     Arrival date & time 04/27/14  1412 History   First MD Initiated Contact with Patient 04/27/14 1418     Chief Complaint  Patient presents with  . Altered Mental Status     (Consider location/radiation/quality/duration/timing/severity/associated sxs/prior Treatment) HPI  68 year old female presents from her nursing facility with altered mental status for the last 2 days. Patient was discharged from this facility with the same diagnosis 2 days ago. Husband at the bedside reports the patient was back to her normal self when she left the hospital. Since she's been a facility she's been altered just like when she had to be admitted last week. At that time she was diagnosed with hyperglycemia, renal failure with rhabdomyolysis and hyponatremia. Facility took labs today and noted her sodium to be 169. It was 154 when she left the hospital 2 days ago. Patient is currently altered and unable to provide a history. Husband endorses she has not fallen recently or hit her head.  Past Medical History  Diagnosis Date  . Thyroid disease    Past Surgical History  Procedure Laterality Date  . Cholecystectomy     Family History  Problem Relation Age of Onset  . Osteoarthritis Mother   . Cancer Mother   . Diabetes Mother   . Heart failure Mother   . Hypertension Mother   . Osteoarthritis Father   . Cancer Father   . Osteoarthritis Sister   . Cancer Sister   . Osteoarthritis Brother   . Cancer Brother    History  Substance Use Topics  . Smoking status: Never Smoker   . Smokeless tobacco: Not on file  . Alcohol Use: No   OB History    No data available     Review of Systems  Unable to perform ROS: Mental status change      Allergies  Review of patient's allergies indicates no known allergies.  Home Medications   Prior to Admission medications   Medication Sig Start Date End Date Taking? Authorizing Provider  acetaminophen (TYLENOL) 325 MG tablet Take 2 tablets  (650 mg total) by mouth every 6 (six) hours as needed for mild pain (or Fever >/= 101). 04/25/14   Alison MurrayAlma M Devine, MD  Alum & Mag Hydroxide-Simeth (MAGIC MOUTHWASH W/LIDOCAINE) SOLN Take 2 mLs by mouth 4 (four) times daily. 04/25/14   Alison MurrayAlma M Devine, MD  aspirin 81 MG chewable tablet Chew 1 tablet (81 mg total) by mouth daily. 04/25/14   Alison MurrayAlma M Devine, MD  diazepam (VALIUM) 5 MG/ML injection Inject 1 mL (5 mg total) into the vein every 4 (four) hours as needed. 04/25/14   Alison MurrayAlma M Devine, MD  diphenoxylate-atropine (LOMOTIL) 2.5-0.025 MG per tablet Take 1 tablet by mouth 4 (four) times daily as needed for diarrhea or loose stools. 04/25/14   Alison MurrayAlma M Devine, MD  furosemide (LASIX) 20 MG tablet Take 1 tablet (20 mg total) by mouth daily. 04/25/14   Alison MurrayAlma M Devine, MD  insulin aspart (NOVOLOG) 100 UNIT/ML injection Inject 4 Units into the skin 3 (three) times daily with meals. 04/25/14   Alison MurrayAlma M Devine, MD  insulin aspart (NOVOLOG) 100 UNIT/ML injection Inject 0-15 Units into the skin 3 (three) times daily with meals. 04/25/14   Alison MurrayAlma M Devine, MD  insulin glargine (LANTUS) 100 UNIT/ML injection Inject 0.1 mLs (10 Units total) into the skin at bedtime. 04/25/14   Alison MurrayAlma M Devine, MD  metoprolol (LOPRESSOR) 50 MG tablet Take 1 tablet (50 mg total)  by mouth 2 (two) times daily. 04/25/14   Alison MurrayAlma M Devine, MD  ondansetron (ZOFRAN) 4 MG tablet Take 1 tablet (4 mg total) by mouth every 6 (six) hours as needed for nausea. 04/25/14   Alison MurrayAlma M Devine, MD  polyethylene glycol [facility](MIRALAX / Ethelene HalGLYCOLAX) packet Take 17 g by mouth daily as needed for mild constipation. 04/25/14   Alison MurrayAlma M Devine, MD  terbinafine (LAMISIL) 1 % cream Apply topically 2 (two) times daily. 04/25/14   Alison MurrayAlma M Devine, MD   BP 126/86 mmHg  Pulse 80  Temp(Src) 98 F (36.7 C) (Oral)  Resp 16  Wt 252 lb (114.306 kg)  SpO2 96% Physical Exam  Constitutional: She appears well-developed and well-nourished.  HENT:  Head: Normocephalic and atraumatic.  Right  Ear: External ear normal.  Left Ear: External ear normal.  Nose: Nose normal.  Very dry lips and oropharynx  Eyes: Pupils are equal, round, and reactive to light. Right eye exhibits no discharge. Left eye exhibits no discharge.  Cardiovascular: Normal rate, regular rhythm and normal heart sounds.   Pulmonary/Chest: Effort normal and breath sounds normal.  Abdominal: Soft. She exhibits no distension. There is no tenderness.  Neurological: She is alert. She is disoriented. GCS eye subscore is 4. GCS verbal subscore is 3. GCS motor subscore is 6.  Grossly moves all 4 extremities. Squeezes hands with normal bilateral grip strengths but does not follow commands well enough for more specialized testing. Is alert but confused, answering questions nonsensically  Skin: Skin is warm and dry.  Nursing note and vitals reviewed.   ED Course  Procedures (including critical care time) Labs Review Labs Reviewed  I-STAT CG4 LACTIC ACID, ED - Abnormal; Notable for the following:    Lactic Acid, Venous 2.52 (*)    All other components within normal limits  CBG MONITORING, ED - Abnormal; Notable for the following:    Glucose-Capillary 105 (*)    All other components within normal limits  URINALYSIS, ROUTINE W REFLEX MICROSCOPIC  BASIC METABOLIC PANEL  HEPATIC FUNCTION PANEL  CBC WITH DIFFERENTIAL  TROPONIN I  CK    Imaging Review Ct Head Wo Contrast  04/27/2014   CLINICAL DATA:  Altered mental status.  EXAM: CT HEAD WITHOUT CONTRAST  TECHNIQUE: Contiguous axial images were obtained from the base of the skull through the vertex without intravenous contrast.  COMPARISON:  None.  FINDINGS: There is ill-defined decreased density involving the moderate portion of the left posterior parietal occipital lobes extending from the lateral ventricle of the cortex. There is an additional questionable region of decreased density in the right temporal lobe versus volume averaging. There is no associated hemorrhage.  There is no midline shift. There is mild prominence of the extra-axial CSF space on the right, may be related to atrophy versus subdural hygroma. The calvarium is intact. Included paranasal sinuses and mastoid air cells are well aerated.  IMPRESSION: Moderate region of decreased density in the left parietal-occipital lobe concerning for subacute infarct. There is additional questionable region of decreased density in the right temporal lobe. Given the questioned multifocal involvement, MRI of the brain, preferably with contrast, recommended for further evaluation to exclude the possibility of underlying mass lesions.  These results were called by telephone at the time of interpretation on 04/27/2014 at 3:28 pm to Dr. Pricilla LovelessSCOTT Pier Bosher , who verbally acknowledged these results.   Electronically Signed   By: Rubye OaksMelanie  Ehinger M.D.   On: 04/27/2014 15:31   [facility]  04/27/2014   CLINICAL DATA:  Altered mental status.  EXAM: PORTABLE CHEST - 1 VIEW  COMPARISON:  April 17, 2014.  FINDINGS: The heart size and mediastinal contours are within normal limits. Both lungs are clear. No pneumothorax or pleural effusion is noted. The visualized skeletal structures are unremarkable.  IMPRESSION: No acute cardiopulmonary abnormality seen.   Electronically Signed   By: Roque Lias M.D.   On: 04/27/2014 14:43     EKG Interpretation   Date/Time:  Thursday April 27 2014 15:01:04 EST Ventricular Rate:  79 PR Interval:  155 QRS Duration: 78 QT Interval:  369 QTC Calculation: 423 R Axis:   3 Text Interpretation:  Sinus arrhythmia Ventricular premature complex Low  voltage, precordial leads Borderline repolarization abnormality no  significant change since Apr 20 2014 Confirmed by Criss Alvine  MD, Hibo Blasdell  ([id number]) on 04/27/2014 3:04:10 PM      Angiocath insertion Performed by: Pricilla Loveless T  Consent: Verbal consent obtained. Risks and benefits: risks, benefits and alternatives were discussed Time  out: Immediately prior to procedure a "time out" was called to verify the correct patient, procedure, equipment, support staff and site/side marked as required.  Preparation: Patient was prepped and draped in the usual sterile fashion.  Vein Location: right basilic  Ultrasound Guided  Gauge: 20  Normal blood return and flush without difficulty Patient tolerance: Patient tolerated the procedure well with no immediate complications.    MDM   Final diagnoses:  Altered mental status, unspecified altered mental status type  Hypernatremia    Patient with recurrent altered mental status shortly after discharge. Patient has no focal neurologic deficits but is unable to probe with exam and is quite altered. She's protecting her airway and not ill-appearing. She does appear dehydrated and dry. 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 Will likely need MRI as an inpatient. I believe her hypernatremia is likely dehydration, will need fluids and admission to the hospitalist.    Audree Camel, MD 04/28/14 [phone]

## 2014-04-27 NOTE — ED Provider Notes (Addendum)
Signed out by Dr Criss [facility]Goldston [facility] to [facility].  Iv ns bolus. hosp paged for adnmission.  Pt ua still pending, cath for ua ordered. Will also need mri brain w contrast as part of her inpatient workup.      Suzi RootsKevin E Oreoluwa Gilmer, MD 04/27/14 [phone]

## 2014-04-27 NOTE — Progress Notes (Signed)
Patient ID: [name], female   DOB: 08/05/1945, 68 y.o.   MRN: [id number]    HISTORY AND PHYSICAL  Location:  [facility]    Place of Service: SNF [phone])   Extended Emergency Contact Information Primary Emergency Contact: Citro,Ralph [address]: [phone]          [place] [phone] [place] Home Phone: [phone] Mobile Phone: [phone] Relation: None  Advanced Directive information    Chief Complaint  Patient presents with  . New Admit To SNF    sepsis due to RLL CAP, HNK DM 2, rhabdomyolysis, afib with RVR, AKI, new CHF, altered MS, hypothyroidism, elevated Na, cholelithiasis and lactic acidosis    HPI:  68 yo female seen today as a new admission. Sh e presented from hospital with above discharge dx. Brother and Sister-in-law present. They are c/a her mental status which has worsened. 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 Now she cannot follow simple commands. She had not been to a doctor in >15 yrs.    Past Medical History  Diagnosis Date  . Thyroid disease     Past Surgical History  Procedure Laterality Date  . Cholecystectomy      No care team member to display  History   Social History  . Marital Status: Married    Spouse Name: N/A    Number of Children: N/A  . Years of Education: N/A   Occupational History  . Not on file.   Social History Main Topics  . Smoking status: Never Smoker   . Smokeless tobacco: Not on file  . Alcohol Use: No  . Drug Use: No  . Sexual Activity: Not on file   Other Topics Concern  . Not on file   Social History Narrative     reports that she has never smoked. She does not have any smokeless tobacco history on file. She reports that she does not drink alcohol or use illicit drugs.  Family History  Problem Relation Age of Onset  . Osteoarthritis Mother   . Cancer Mother   . Diabetes Mother   . Heart failure Mother   .  Hypertension Mother   . Osteoarthritis Father   . Cancer Father   . Osteoarthritis Sister   . Cancer Sister   . Osteoarthritis Brother   . Cancer Brother    No family status information on file.     There is no immunization history on file for this patient.  No Known Allergies  Medications: Patient's Medications  New Prescriptions   No medications on file  Previous Medications   ACETAMINOPHEN (TYLENOL) 325 MG TABLET    Take 2 tablets (650 mg total) by mouth every 6 (six) hours as needed for mild pain (or Fever >/= 101).   ALUM & MAG HYDROXIDE-SIMETH (MAGIC MOUTHWASH W/LIDOCAINE) SOLN    Take 2 mLs by mouth 4 (four) times daily.   ASPIRIN 81 MG CHEWABLE TABLET    Chew 1 tablet (81 mg total) by mouth daily.   DIAZEPAM (VALIUM) 5 MG/ML INJECTION    Inject 1 mL (5 mg total) into the vein every 4 (four) hours as needed.   DIPHENOXYLATE-ATROPINE (LOMOTIL) 2.5-0.025 MG PER TABLET    Take 1 tablet by mouth 4 (four) times daily as needed for diarrhea or loose stools.   FUROSEMIDE (LASIX) 20 MG TABLET    Take 1 tablet (20 mg total) by mouth daily.   INSULIN  ASPART (NOVOLOG) 100 UNIT/ML INJECTION    Inject 4 Units into the skin 3 (three) times daily with meals.   INSULIN ASPART (NOVOLOG) 100 UNIT/ML INJECTION    Inject 0-15 Units into the skin 3 (three) times daily with meals.   INSULIN GLARGINE (LANTUS) 100 UNIT/ML INJECTION    Inject 0.1 mLs (10 Units total) into the skin at bedtime.   METOPROLOL (LOPRESSOR) 50 MG TABLET    Take 1 tablet (50 mg total) by mouth 2 (two) times daily.   ONDANSETRON (ZOFRAN) 4 MG TABLET    Take 1 tablet (4 mg total) by mouth every 6 (six) hours as needed for nausea.   POLYETHYLENE GLYCOL (MIRALAX / GLYCOLAX) PACKET    Take 17 g by mouth daily as needed for mild constipation.   TERBINAFINE (LAMISIL) 1 % CREAM    Apply topically 2 (two) times daily.  Modified Medications   No medications on file  Discontinued Medications   No medications on file    Review of  Systems  Unable to obtain due to pt's mental status.  Filed Vitals:   04/27/14 1416  BP: 118/75  Pulse: 80  Temp: 97.2 F (36.2 C)  SpO2: 95%   There is no weight on file to calculate BMI.  Physical Exam  CONSTITUTIONAL: Looks ill in NAD. Awake and alert but confused and thicnks she is going to the "air" (brother states she is getting in the care and using the word air instead of car). Does not follow simple commands well HEENT: PERRLA. No scleral icterus.  NECK: Supple. Nontender. No palpable cervical or supraclavicular lymph nodes. No carotid bruit b/l. No thyromegaly or thyroid mass palpable.  CVS: Irregular irregular without murmur, gallop or rub. LUNGS: reduced BS RLL but CTA b/l no wheezing, rales or rhonchi. ABDOMEN: Bowel sounds present x 4. Soft, nontender, nondistended. No palpable mass or bruit EXTREMITIES: Trace LE edema b/l. Distal pulses palpable. No calf tenderness PSYCH: confused  Labs reviewed: Nursing Home on 04/27/2014  Component Date Value Ref Range Status  . Creatinine 04/27/2014 1.7* 0.5 - 1.1 mg/dL Final  . Sodium 96/07/5407 169* 137 - 147 mmol/L Final  Admission on 04/17/2014, Discharged on 04/25/2014  No results displayed because visit has over 200 results.    d/c Creatinine 1.74. D/c NA 154  Ct Abdomen Pelvis Wo Contrast  04/18/2014   CLINICAL DATA:  Status post fall; found on floor. Sepsis, with progressive weakness for several weeks. Erythema at the groin and abdominal wall. Leukocytosis. Initial encounter.  EXAM: CT ABDOMEN AND PELVIS WITHOUT CONTRAST  TECHNIQUE: Multidetector CT imaging of the abdomen and pelvis was performed following the standard protocol without IV contrast.  COMPARISON:  Lumbar spine radiograph performed 04/17/2014  FINDINGS: Right lower lobe airspace opacification is compatible with pneumonia.  The liver and spleen are unremarkable in appearance. Scattered stones are seen within the gallbladder. The gallbladder is otherwise  unremarkable. The pancreas and adrenal glands are unremarkable.  Nonspecific perinephric stranding is noted bilaterally. Mild right-sided pelvicaliectasis remains within normal limits. There is no evidence of hydronephrosis. No renal or ureteral stones are seen.  No free fluid is identified. The small bowel is unremarkable in appearance. The stomach is within normal limits. No acute vascular abnormalities are seen. Minimal calcification is seen along the abdominal aorta and its branches.  The appendix is not definitely seen; there is no evidence for appendicitis. The colon is unremarkable in appearance.  A small-to-moderate umbilical hernia is noted, containing only fat.  The bladder is largely decompressed and grossly unremarkable. A large 8.0 cm fibroid is suspected within the uterus. The uterus is mildly enlarged. The ovaries are grossly symmetric. No suspicious adnexal masses are seen. Stop No inguinal lymphadenopathy is seen.  No acute osseous abnormalities are identified.  IMPRESSION: 1. Right lower lobe pneumonia noted; this likely explains the patient's symptoms. 2. Cholelithiasis; gallbladder otherwise unremarkable. 3. Small to moderate umbilical hernia, containing only fat. 4. Mildly enlarged fibroid uterus noted.   Electronically Signed   By: Roanna RaiderJeffery  Chang M.D.   On: 04/18/2014 05:07   Dg Chest 2 View  04/17/2014   CLINICAL DATA:  Pt arrived via EMS with report of falling without injury d/t "legs given out" on concrete garage floor since 1300 and was found by spouse ast 1845. EMS reported that CBG was greater than 600, poor appetite, noncompliant with diet d/t noted soft drinks and sweeten tea in house, strong urine smell, and red rash to abd folds. Pt c/o lower back pain, large bruising to left hip and groin, denies any chest complaints, best obtainable images due to pt size and condition  EXAM: CHEST  2 VIEW  COMPARISON:  None.  FINDINGS: There are low lung volumes with patient rotation to the  right. The lateral view is limited. There is elevation of the right hemidiaphragm. The aorta appears ectatic and somewhat unfolded. The heart size is normal. Mild atelectasis is present at the right lung base. There is no consolidation, significant pleural effusion or pneumothorax. No acute osseous findings identified.  IMPRESSION: Elevated right hemidiaphragm with associated right basilar atelectasis. No consolidation or significant edema.   Electronically Signed   By: Roxy HorsemanBill  Veazey M.D.   On: 04/17/2014 21:36   Dg Lumbar Spine 2-3 Views  04/17/2014   CLINICAL DATA:  Fall.  Weakness.  Initial evaluation.  EXAM: LUMBAR SPINE - 2-3 VIEW  COMPARISON:  None.  FINDINGS: Five lumbar type vertebral bodies are present. There is mild levoscoliosis. Vertebral bodies are otherwise normally aligned with preservation of the normal lumbar lordosis.  There is anterior wedging of the L1 and L2 vertebral bodies, favored to be chronic in nature. Vertebral body heights are otherwise preserved. No definite acute fracture listhesis.  No soft tissue abnormality.  Moderate multilevel degenerative disc disease present within the visualized spine.  IMPRESSION: 1. No definite acute traumatic injury within the lumbar spine. 2. Anterior wedging of the L1 and L2 vertebral bodies, favored to be chronic in nature. Possible acute compression fractures are not entirely excluded. Correlation with site of pain recommended.   Electronically Signed   By: Rise MuBenjamin  McClintock M.D.   On: 04/17/2014 21:37   Dg Pelvis 1-2 Views  04/17/2014   CLINICAL DATA:  Patient fell on concrete floor earlier in the day  EXAM: PELVIS - 1-2 VIEW  COMPARISON:  None.  FINDINGS: There is no evidence of pelvic fracture or dislocation. 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 No erosive change.  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 No fracture or dislocation.   Electronically Signed   By: Bretta BangWilliam  Woodruff M.D.   On: 04/17/2014 21:37   Dg Tibia/fibula Left  04/17/2014   CLINICAL DATA:  Status post fall; acute onset of left lower leg pain. Initial encounter.  EXAM: LEFT TIBIA AND FIBULA - 2 VIEW  COMPARISON:  None.  FINDINGS: There  is no evidence of fracture or dislocation. The tibia and fibula appear intact.  Marginal osteophytes are seen arising at the medial and lateral compartments of the knee, with wall osteophytes also seen. No knee joint effusion is identified.  The ankle mortise is incompletely assessed, but appears grossly unremarkable. A plantar calcaneal spur is incidentally noted. No significant soft tissue abnormalities are identified.  IMPRESSION: No evidence of fracture or dislocation.   Electronically Signed   By: Roanna Raider M.D.   On: 04/17/2014 22:52     Assessment/Plan    ICD-9-CM ICD-10-CM   1. Altered mental status, unspecified altered mental status type 780.97 R41.82   2. Hypernatremia 276.0 E87.0   3. AKI (acute kidney injury) - stable 584.9 N17.9   4. Hypokalemia 276.8 E87.6   5. Atrial fibrillation, unspecified 427.31 I48.91   6. Type 2 diabetes mellitus with hyperosmolarity without coma 250.20 E11.00     - pt sent to the ED for eval of worsening mental status and increasing sodium along with low potassium (3.1)   Asaf Elmquist S. Ancil Linsey  [facility] [address] [place], [place] [address] [phone] Office (Wednesdays and Fridays 8 AM - 5 PM) [phone] Cell (Monday-Friday 8 AM - 5 PM)

## 2014-04-27 NOTE — ED Notes (Signed)
Bed: WA04 Expected date:  Expected time:  Means of arrival:  Comments: Ems- elderly, high sodium level

## 2014-04-27 NOTE — H&P (Signed)
History and Physical:    [name]:[id number] DOB: October 09, 1945 DOA: 04/27/2014  Referring physician: Dr. Denton Lank PCP: Bufford Spikes, DO   Chief Complaint: AMS, generalized weakness  History of Present Illness:   [name] is an 68 y.o. female with a PMH of atrial fibillation, recently hospitalized 04/17/14-04/25/14 for treatment of sepsis, pneumonia, rhabdomyolysis, and demand ischemia from afib w/ RVR.  She was also hypernatremic at discharge with a sodium of 154 and a creatinine of 1.74.  She was discharged on lasix.  According to notes, she was back to her normal baseline mental status at discharge.  She had routine labs drawn today, for hospital follow up of electrolytes, and her sodium had increased to 169.  Her creatinine is 1.66.  Her mental status has deteriorated and she has been weak all day, according to family, who visited her today at her facility to have lunch with her.  The patient is very confused and unable to provide any additional information.  Denies pain, shortness of breath and cough.  Denies nausea and vomiting.  Keeps repeating "I need air" over and over again.  ROS:   Unable to do detailed ROS secondary to patient confusion, pertinent negatives as noted above.   Past Medical History:   Past Medical History  Diagnosis Date  . Thyroid disease   . Rhabdomyolysis 04/18/2014  . Hyperosmolar non-ketotic state in patient with type 2 diabetes mellitus 04/17/2014  . AKI (acute kidney injury) 04/18/2014  . Sepsis 04/18/2014  . Atrial fibrillation 04/27/2014  . Diabetes mellitus, type 2 04/27/2014    Past Surgical History:   Past Surgical History  Procedure Laterality Date  . Cholecystectomy      Social History:   History   Social History  . Marital Status: Married    Spouse Name: N/A    Number of Children: N/A  . Years of Education: N/A   Occupational History  . Not on file.   Social History Main Topics  . Smoking status: Never Smoker     . Smokeless tobacco: Not on file  . Alcohol Use: No  . Drug Use: No  . Sexual Activity: Not on file   Other Topics Concern  . Not on file   Social History Narrative   Lived w/ husband prior to 1st hospitalization.  Now from SNF.  Had not been ambulatory for a few weeks prior to 1st hospitalization.    Family history:   Family History  Problem Relation Age of Onset  . Osteoarthritis Mother   . Cancer Mother   . Diabetes Mother   . Heart failure Mother   . Hypertension Mother   . Osteoarthritis Father   . Cancer Father   . Osteoarthritis Sister   . Cancer Sister   . Osteoarthritis Brother   . Cancer Brother     Allergies   Morphine and related  Current Medications:   Prior to Admission medications   Medication Sig Start Date End Date Taking? Authorizing Provider  acetaminophen (TYLENOL) 325 MG tablet Take 2 tablets (650 mg total) by mouth every 6 (six) hours as needed for mild pain (or Fever >/= 101). 04/25/14  Yes [name], MD  aspirin 81 MG chewable tablet Chew 1 tablet (81 mg total) by mouth daily. 04/25/14  Yes [name], MD  diazepam (VALIUM) 5 MG/ML injection Inject 1 mL (5 mg total) into the vein every 4 (four) hours as needed. 04/25/14  Yes [name],  MD  diphenoxylate-atropine (LOMOTIL) 2.5-0.025 MG per tablet Take 1 tablet by mouth 4 (four) times daily as needed for diarrhea or loose stools. 04/25/14  Yes Alison MurrayAlma M Devine, MD  furosemide (LASIX) 20 MG tablet Take 1 tablet (20 mg total) by mouth daily. 04/25/14  Yes Alison MurrayAlma M Devine, MD  insulin aspart (NOVOLOG) 100 UNIT/ML injection Inject 4 Units into the skin 3 (three) times daily with meals. 04/25/14  Yes Alison MurrayAlma M Devine, MD  insulin aspart (NOVOLOG) 100 UNIT/ML injection Inject 0-15 Units into the skin 3 (three) times daily with meals. Patient taking differently: Inject 5 Units into the skin 3 (three) times daily with meals. 5 units if greater than 150 04/25/14  Yes Alison MurrayAlma M Devine, MD  insulin glargine  (LANTUS) 100 UNIT/ML injection Inject 0.1 mLs (10 Units total) into the skin at bedtime. 04/25/14  Yes Alison MurrayAlma M Devine, MD  metoprolol (LOPRESSOR) 50 MG tablet Take 1 tablet (50 mg total) by mouth 2 (two) times daily. 04/25/14  Yes Alison MurrayAlma M Devine, MD  ondansetron (ZOFRAN) 4 MG tablet Take 1 tablet (4 mg total) by mouth every 6 (six) hours as needed for nausea. 04/25/14  Yes Alison MurrayAlma M Devine, MD  polyethylene glycol [facility](MIRALAX / Ethelene HalGLYCOLAX) packet Take 17 g by mouth daily as needed for mild constipation. 04/25/14  Yes Alison MurrayAlma M Devine, MD  terbinafine (LAMISIL) 1 % cream Apply topically 2 (two) times daily. 04/25/14  Yes Alison MurrayAlma M Devine, MD  Alum & Mag Hydroxide-Simeth (MAGIC MOUTHWASH W/LIDOCAINE) SOLN Take 2 mLs by mouth 4 (four) times daily. Patient not taking: Reported on 04/27/2014 04/25/14   Alison MurrayAlma M Devine, MD    Physical Exam:   Filed Vitals:   04/27/14 1606 04/27/14 1646 04/27/14 1702 04/27/14 1716  BP: 132/65 114/99 145/88   Pulse: 80 78  112  Temp:      TempSrc:      Resp: 18 27  25   Weight:      SpO2: 94% 97%  85%     Physical Exam: Blood pressure 145/88, pulse 112, temperature 98 F (36.7 C), temperature source Oral, resp. rate 25, weight 114.306 kg (252 lb), SpO2 85 %. Gen: No acute distress.  Confused. Head: Normocephalic, atraumatic. Eyes: PERRL, EOMI, sclerae nonicteric. Mouth: Oropharynx with dry mucous membranes. Neck: Supple, no thyromegaly, no lymphadenopathy, no jugular venous distention. Chest: Lungs CTAB. CV: Heart sounds are irregularly irregular. Abdomen: Soft, nontender, nondistended with normal active bowel sounds. Extremities: Extremities show swelling on the left compared to the right. Skin: Warm and dry.  Erythema in perianal area and groin.  Small skin tear to sacrum. Neuro: Alert and disoriented, unable to consistently follow commands; cranial nerves II through XII grossly intact but limited exam due to patient inability to follow commands. Psych: Mood and affect  normal.   Data Review:    Labs: Basic Metabolic Panel:  Recent Labs Lab 04/22/14 0533 04/24/14 0404 04/25/14 0715 04/27/14 1425 04/27/14 1504  NA 144 154* 154* 169* 166*  K 3.0* 3.1* 3.8  --  3.2*  CL 116* 123* 122*  --  128*  CO2 23 26 27   --  29  GLUCOSE 141* 128* 104*  --  102*  BUN 18 17 16   --  17  CREATININE 1.53* 1.59* 1.74* 1.7* 1.66*  CALCIUM 8.1* 8.5 8.1*  --  8.7   Liver Function Tests:  Recent Labs Lab 04/27/14 1504  AST 33  ALT 17  ALKPHOS 315*  BILITOT 0.7  PROT 6.9  ALBUMIN 2.5*   CBC:  Recent Labs Lab 04/22/14 0533 04/24/14 0404 04/27/14 1504  WBC 10.8* 13.7* 18.8*  NEUTROABS  --   --  16.3*  HGB 11.4* 12.0 12.2  HCT 34.4* 37.2 40.0  MCV 89.8 91.4 95.9  PLT 229 201 181   Cardiac Enzymes:  Recent Labs Lab 04/22/14 0533 04/27/14 1504  CKTOTAL 96 43  TROPONINI  --  0.06*    BNP (last 3 results)  Recent Labs  04/17/14 2033  PROBNP 7693.0*   CBG:  Recent Labs Lab 04/24/14 1301 04/24/14 1734 04/24/14 2131 04/25/14 0717 04/27/14 1534  GLUCAP 154* 191* 92 97 105*    Radiographic Studies: Ct Head Wo Contrast  04/27/2014   CLINICAL DATA:  Altered mental status.  EXAM: CT HEAD WITHOUT CONTRAST  TECHNIQUE: Contiguous axial images were obtained from the base of the skull through the vertex without intravenous contrast.  COMPARISON:  None.  FINDINGS: There is ill-defined decreased density involving the moderate portion of the left posterior parietal occipital lobes extending from the lateral ventricle of the cortex. There is an additional questionable region of decreased density in the right temporal lobe versus volume averaging. There is no associated hemorrhage. There is no midline shift. There is mild prominence of the extra-axial CSF space on the right, may be related to atrophy versus subdural hygroma. The calvarium is intact. Included paranasal sinuses and mastoid air cells are well aerated.  IMPRESSION: Moderate region of  decreased density in the left parietal-occipital lobe concerning for subacute infarct. There is additional questionable region of decreased density in the right temporal lobe. Given the questioned multifocal involvement, MRI of the brain, preferably with contrast, recommended for further evaluation to exclude the possibility of underlying mass lesions.  These results were called by telephone at the time of interpretation on 04/27/2014 at 3:28 pm to Dr. Pricilla LovelessSCOTT GOLDSTON , who verbally acknowledged these results.   Electronically Signed   By: Rubye OaksMelanie  Ehinger M.D.   On: 04/27/2014 15:31   Dg Chest Port 1 View  04/27/2014   CLINICAL DATA:  Altered mental status.  EXAM: PORTABLE CHEST - 1 VIEW  COMPARISON:  April 17, 2014.  FINDINGS: The heart size and mediastinal contours are within normal limits. Both lungs are clear. No pneumothorax or pleural effusion is noted. The visualized skeletal structures are unremarkable.  IMPRESSION: No acute cardiopulmonary abnormality seen.   Electronically Signed   By: Roque LiasJames  Green M.D.   On: 04/27/2014 14:43    EKG: Independently reviewed. Sinus arrhythmia; Ventricular premature complex; Low voltage, precordial leads; Borderline repolarization abnormality; no significant change since Apr 20 2014.   Assessment/Plan:   Principal Problem:   Hypernatremia /dehydration  Likely secondary to dehydration in the setting of poor PO intake and treatment with Lasix.  Hold Lasix.  Hydrate with NS.  Monitor sodium.  Active Problems:   Hypokalemia  Likely from diuretic.  Add KCL to IVF.    Altered mental status / metabolic encephalopathy / Abnormal CT of the head  Correct electrolytes.  Check MRI to rule out mass lesion/stroke given findings on CT head.    Weakness  No obvious focal deficits, but unable to fully assess at this time.  MRI requested.    Yeast dermatitis  Continue Lamisil.    H/O Atrial fibrillation  Currently in NSR with PVCs.  Monitor  on tele.  Continue ASA and metoprolol.    Diabetes mellitus, type 2  Continue Lantus.  SSI, sensitive scale ordered.  CKD (chronic kidney disease), stage III  Creatinine currently at usual baseline values.     DVT prophylaxis  Lovenox ordered.  Code Status: Full. Family Communication: Brother at bedside. Disposition Plan: SNF when stable.  Time spent: 1 hour.  Malania Gawthrop [facility] Pager [phone] Cell: [phone]   If 7PM-7AM, please contact night-coverage [email or web address] Password [email or web address] 04/27/2014, 5:59 PM
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
## 2014-04-27 NOTE — Progress Notes (Addendum)
Chaplain rounding was referred to [facility]. When chaplain arrived Natalie [name] was in bed surrounded by her husband and brother and his wife. She was overly anxious asking to be "in the air" and complaining of hurting in her mid-regions. When asked where the pain was settling she could not find the specific location. She swings from being frustrated that the chaplain could not understand what she is trying to say about needed to be in the air, to angry that chaplain and the medical staff will not supply the air she needs. Her husband was asked in private what she means by needing to be in the air and he responded that he had no idea. He further reported that this is not like her normal shelf, that something had changed between being released from the hospital and her return. He is very upset that his wife's mental state has deteriorated so much, and is not coping well with her deteriorated mental state and her being transferred to "a large impersonal hospital."  Natalie Miles [name] is highly anxious, nervous and exhibits signs of being disoriented and frightened. She is speaking to people who are not present and calling out to these to take her away. Family is undone over this manifestation.   Husband does not like his wife being transferred to [facility] and does not understand what is at [facility] that is not at [facility]. He left at about 2130 to go "close down his business" and return about 2300. A NP or physician is expected to speak with the family about Natalie [name] condition and explain. Mr [name], the husband has told the attending nurse to have the NP or physician explain everything to Natalie [name]' brother and the brother will call him at work to explain why his wife is being transferred [facility].   After the NP came to explain Natalie Miles [name]' situation the brother called the husband and reports the husband is very angry that his wife is being transferred [facility], even though that is where  the best care is located for Natalie Miles [name].   Please page the chaplain if the chaplain can assist patient or family in any way. Strongly suggest the [facility]plain at [facility] be paged to assist the family upon their arrival at [facility], as Mr Miles [name]' anxiety may be high and he may need spiritual and emotional care in the early stages.Leonette Most.  Masin Shatto D. Erling Arrazola, DMin Chaplain

## 2014-04-28 ENCOUNTER — Inpatient Hospital Stay (HOSPITAL_COMMUNITY): Payer: Medicare Other

## 2014-04-28 ENCOUNTER — Other Ambulatory Visit (HOSPITAL_COMMUNITY): Payer: Medicare Other

## 2014-04-28 DIAGNOSIS — I6330 Cerebral infarction due to thrombosis of unspecified cerebral artery: Secondary | ICD-10-CM

## 2014-04-28 DIAGNOSIS — I482 Chronic atrial fibrillation: Secondary | ICD-10-CM

## 2014-04-28 DIAGNOSIS — R509 Fever, unspecified: Secondary | ICD-10-CM

## 2014-04-28 DIAGNOSIS — I6340 Cerebral infarction due to embolism of unspecified cerebral artery: Secondary | ICD-10-CM | POA: Insufficient documentation

## 2014-04-28 DIAGNOSIS — I480 Paroxysmal atrial fibrillation: Secondary | ICD-10-CM

## 2014-04-28 DIAGNOSIS — E870 Hyperosmolality and hypernatremia: Secondary | ICD-10-CM | POA: Diagnosis not present

## 2014-04-28 LAB — BASIC METABOLIC PANEL
BUN: 16 mg/dL (ref 6–23)
BUN: 16 mg/dL (ref 6–23)
CO2: 26 mmol/L (ref 19–32)
CO2: 27 mmol/L (ref 19–32)
Calcium: 8.7 mg/dL (ref 8.4–10.5)
Calcium: 8.6 mg/dL (ref 8.4–10.5)
Chloride: >130 mEq/L (ref 96–112)
Creatinine, Ser: 1.84 mg/dL — ABNORMAL HIGH (ref 0.50–1.10)
Creatinine, Ser: 1.95 mg/dL — ABNORMAL HIGH (ref 0.50–1.10)
GFR calc non Af Amer: 25 mL/min — ABNORMAL LOW (ref >90)
GFR, EST AFRICAN AMERICAN: 31 mL/min — AB (ref >90)
GFR, EST AFRICAN AMERICAN: 29 mL/min — AB (ref >90)
GFR, EST NON AFRICAN AMERICAN: 27 mL/min — AB (ref >90)
Glucose, Bld: 89 mg/dL (ref 70–99)
Glucose, Bld: 119 mg/dL — ABNORMAL HIGH (ref 70–99)
POTASSIUM: 3.5 mmol/L (ref 3.5–5.1)
Potassium: 3 mmol/L — ABNORMAL LOW (ref 3.5–5.1)
SODIUM: 169 mmol/L — AB (ref 135–145)
Sodium: 168 mmol/L (ref 135–145)

## 2014-04-28 LAB — GLUCOSE, CAPILLARY
GLUCOSE-CAPILLARY: 104 mg/dL — AB (ref 70–99)
GLUCOSE-CAPILLARY: 112 mg/dL — AB (ref 70–99)
Glucose-Capillary: 100 mg/dL — ABNORMAL HIGH (ref 70–99)
Glucose-Capillary: 102 mg/dL — ABNORMAL HIGH (ref 70–99)
Glucose-Capillary: 104 mg/dL — ABNORMAL HIGH (ref 70–99)
Glucose-Capillary: 89 mg/dL (ref 70–99)

## 2014-04-28 LAB — CBC
HCT: 37.6 % (ref 36.0–46.0)
Hemoglobin: 12.3 g/dL (ref 12.0–15.0)
MCH: 31.1 pg (ref 26.0–34.0)
MCHC: 32.7 g/dL (ref 30.0–36.0)
MCV: 95.2 fL (ref 78.0–100.0)
PLATELETS: 177 10*3/uL (ref 150–400)
RBC: 3.95 MIL/uL (ref 3.87–5.11)
RDW: 16.9 % — AB (ref 11.5–15.5)
WBC: 21.1 10*3/uL — AB (ref 4.0–10.5)

## 2014-04-28 MED ORDER — INSULIN ASPART 100 UNIT/ML ~~LOC~~ SOLN
0.0000 [IU] | SUBCUTANEOUS | Status: DC
  Administered 2014-04-29: 2 [IU] via SUBCUTANEOUS
  Administered 2014-04-29 – 2014-04-30 (×9): 1 [IU] via SUBCUTANEOUS
  Administered 2014-05-01: 2 [IU] via SUBCUTANEOUS
  Administered 2014-05-01 (×3): 1 [IU] via SUBCUTANEOUS
  Administered 2014-05-01 (×2): 2 [IU] via SUBCUTANEOUS
  Administered 2014-05-02: 1 [IU] via SUBCUTANEOUS
  Administered 2014-05-02 – 2014-05-04 (×13): 2 [IU] via SUBCUTANEOUS
  Administered 2014-05-04: 3 [IU] via SUBCUTANEOUS

## 2014-04-28 MED ORDER — POTASSIUM CHLORIDE 2 MEQ/ML IV SOLN
INTRAVENOUS | Status: DC
  Administered 2014-04-28 – 2014-05-01 (×7): via INTRAVENOUS
  Filled 2014-04-28 (×13): qty 1000

## 2014-04-28 MED ORDER — ASPIRIN 300 MG RE SUPP
300.0000 mg | Freq: Once | RECTAL | Status: AC
  Administered 2014-04-28: 300 mg via RECTAL
  Filled 2014-04-28: qty 1

## 2014-04-28 MED ORDER — LORAZEPAM 2 MG/ML IJ SOLN
0.2500 mg | Freq: Once | INTRAMUSCULAR | Status: AC
  Administered 2014-04-28: 0.25 mg via INTRAVENOUS
  Filled 2014-04-28: qty 1

## 2014-04-28 MED ORDER — CETYLPYRIDINIUM CHLORIDE 0.05 % MT LIQD
7.0000 mL | Freq: Two times a day (BID) | OROMUCOSAL | Status: DC
  Administered 2014-04-28 – 2014-05-07 (×18): 7 mL via OROMUCOSAL

## 2014-04-28 MED ORDER — FREE WATER: 50.0000 mL | Freq: Three times a day (TID) | Status: DC

## 2014-04-28 NOTE — Progress Notes (Signed)
Received critical lab from [facility] on [place].  Sodium 168; Chloride >130.  Information given to RN to inform MD.

## 2014-04-28 NOTE — Consult Note (Signed)
Referring Physician: Nunzio Cory    Chief Complaint: Bilateral MCA territory cerebral infarctions.  HPI: DEIRDRE GRYDER is an 69 y.o. female with a history of atrial fibrillation and recent hospitalization for sepsis, pneumonia rhabdomyolysis as well as hypernatremia and acute kidney injury, brought to the emergency room at [facility] for increased confusion and each abnormality. Serum sodium was 169. CT scan of her head showed low density areas involving large area of left MCA territory as well as right temporal abnormality, with appearance indicative of probable subacute infarctions. MRI was recommended but could not be obtained because of patient's agitated state. She was transferred here for further evaluation and management with stroke service intervention.  LSN: Unclear tPA Given: No: CT abnormalities is an on presentation, as well as unclear when last known well. mRankin:  Past Medical History  Diagnosis Date  . Thyroid disease   . Rhabdomyolysis 04/18/2014  . Hyperosmolar non-ketotic state in patient with type 2 diabetes mellitus 04/17/2014  . AKI (acute kidney injury) 04/18/2014  . Sepsis 04/18/2014  . Atrial fibrillation 04/27/2014  . Diabetes mellitus, type 2 04/27/2014    Family History  Problem Relation Age of Onset  . Osteoarthritis Mother   . Cancer Mother   . Diabetes Mother   . Heart failure Mother   . Hypertension Mother   . Osteoarthritis Father   . Cancer Father   . Osteoarthritis Sister   . Cancer Sister   . Osteoarthritis Brother   . Cancer Brother      Medications: I have reviewed the patient's current medications.  ROS: Unobtainable due to patient's level of confusion.  Physical Examination: Blood pressure 97/82, pulse 91, temperature 99.1 F (37.3 C), temperature source Oral, resp. rate 20, height  (1.575 m), weight 111.041 kg (244 lb 12.8 oz), SpO2 97 %.  Neurologic Examination: Mental Status: Alert, markedly agitated.  Speech  content was nonsensical with echolalia. She appeared to have significant receptive and expressive aphasia. Cranial Nerves: II-Visual fields were intact to visual threat. III/IV/VI-Pupils were equal and reacted. Extraocular movements were full and conjugate.    VII- no facial weakness. VIII-normal. X-moderately dysarthric. Motor: She appeared to have moderate proximal weakness of right upper extremity or to left upper extremity; movement and strength of lower extremities is symmetrical. Sensory: Responses to tactile stimulation were unreliable. Deep Tendon Reflexes: Trace to 1+ and symmetric. Plantars: Flexor bilaterally Cerebellar: Unable to perform due to degree of confusion.  Ct Head Wo Contrast  04/27/2014   CLINICAL DATA:  Altered mental status.  EXAM: CT HEAD WITHOUT CONTRAST  TECHNIQUE: Contiguous axial images were obtained from the base of the skull through the vertex without intravenous contrast.  COMPARISON:  None.  FINDINGS: There is ill-defined decreased density involving the moderate portion of the left posterior parietal occipital lobes extending from the lateral ventricle of the cortex. There is an additional questionable region of decreased density in the right temporal lobe versus volume averaging. There is no associated hemorrhage. There is no midline shift. There is mild prominence of the extra-axial CSF space on the right, may be related to atrophy versus subdural hygroma. The calvarium is intact. Included paranasal sinuses and mastoid air cells are well aerated.  IMPRESSION: Moderate region of decreased density in the left parietal-occipital lobe concerning for subacute infarct. There is additional questionable region of decreased density in the right temporal lobe. Given the questioned multifocal involvement, MRI of the brain, preferably with contrast, recommended for further evaluation to exclude the  possibility of underlying mass lesions.  These results were called by telephone  at the time of interpretation on 04/27/2014 at 3:28 pm to Dr. Pricilla Loveless , who verbally acknowledged these results.   Electronically Signed   By: Rubye Oaks M.D.   On: 04/27/2014 15:31   Dg Chest Port 1 View  04/27/2014   CLINICAL DATA:  Altered mental status.  EXAM: PORTABLE CHEST - 1 VIEW  COMPARISON:  April 17, 2014.  FINDINGS: The heart size and mediastinal contours are within normal limits. Both lungs are clear. No pneumothorax or pleural effusion is noted. The visualized skeletal structures are unremarkable.  IMPRESSION: No acute cardiopulmonary abnormality seen.   Electronically Signed   By: Roque Lias M.D.   On: 04/27/2014 14:43    Assessment: 70 y.o. 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 Lesions on CT scan are less likely mass lesions, with no significant mass effect noted.  Stroke Risk Factors - atrial fibrillation, diabetes mellitus and hypertension  Plan: 1. HgbA1c, fasting lipid panel 2. MRI, MRA  of the brain without and with contrast when patient's agitation has sufficiently resolved 3. PT consult, OT consult, Speech consult 4. Echocardiogram 5. Carotid dopplers 6. Prophylactic therapy-Antiplatelet med: Aspirin  7. Risk factor modification 8. Telemetry monitoring   C.R. Roseanne Reno, MD [facility] [phone]  04/28/2014, 7:06 AM

## 2014-04-28 NOTE — Evaluation (Signed)
Clinical/Bedside Swallow Evaluation Patient Details  Name: [name] MRN: [id number] Date of Birth: 09/19/1945  Today's Date: 04/28/2014 Time: 0920-0940 SLP Time Calculation (min) (ACUTE ONLY): 20 min  Past Medical History:  Past Medical History  Diagnosis Date  . Thyroid disease   . Rhabdomyolysis 04/18/2014  . Hyperosmolar non-ketotic state in patient with type 2 diabetes mellitus 04/17/2014  . AKI (acute kidney injury) 04/18/2014  . Sepsis 04/18/2014  . Atrial fibrillation 04/27/2014  . Diabetes mellitus, type 2 04/27/2014   Past Surgical History:  Past Surgical History  Procedure Laterality Date  . Cholecystectomy     HPI:  [name] is an 69 y.o. female with a history of atrial fibrillation and recent hospitalization for sepsis, pneumonia rhabdomyolysis as well as hypernatremia and acute kidney injury, brought [facility] for increased confusion and speech abnormality. Serum sodium was 169. CT scan of her head showed low density areas involving large area of left MCA territory as well as right temporal abnormality, with appearance indicative of probable subacute infarctions. MRI was recommended but could not be obtained because of patient's agitated state.   Assessment / Plan / Recommendation Clinical Impression  Pt presents with inability to consume POs due to severely impaired cognition and poor oral hygiene. With max attempts at self feeding and total assist feeding SLP could not facilitate oral acceptance of thin or puree boluses. Pt is restless and often combative. She appears to want to drink from a straw, but cannot achieve oral suction due to confusion. She is dysarthric due to severely dry oral mucosa, cracked and reddened. SLP attempted oral care with various methods, but pt strongly resistant. Family reports she has not eaten for a week and her mouth has not been cleaned due to her behavior. If mentation does not improve, pt will need  short term alternate method of feeding.     Aspiration Risk  Mild    Diet Recommendation Alternative means - temporary        Other  Recommendations Oral Care Recommendations: Oral care BID   Follow Up Recommendations  Skilled Nursing facility    Frequency and Duration min 2x/week  2 weeks   Pertinent Vitals/Pain NA    SLP Swallow Goals     Swallow Study Prior Functional Status       General HPI: [name] is an 69 y.o. female with a history of atrial fibrillation and recent hospitalization for sepsis, pneumonia rhabdomyolysis as well as hypernatremia and acute kidney injury, brought [facility] emergency room at [facility] for increased confusion and speech abnormality. Serum sodium was 169. CT scan of her head showed low density areas involving large area of left MCA territory as well as right temporal abnormality, with appearance indicative of probable subacute infarctions. MRI was recommended but could not be obtained because of patient's agitated state. Type of Study: Bedside swallow evaluation Previous Swallow Assessment: none Diet Prior to this Study: NPO Temperature Spikes Noted: No Respiratory Status: Room air History of Recent Intubation: No Behavior/Cognition: Alert;Confused;Uncooperative;Distractible;Doesn't follow directions Oral Cavity - Dentition: Poor condition Self-Feeding Abilities: Total assist Patient Positioning: Upright in bed Baseline Vocal Quality: Clear Volitional Cough: Cognitively unable to elicit Volitional Swallow: Unable to elicit    Oral/Motor/Sensory Function Overall Oral Motor/Sensory Function: Appears within functional limits for tasks assessed (doesnt follow commands)   Ice Chips     Thin Liquid Thin Liquid: Impaired Presentation: Straw Oral Phase Impairments: Poor awareness of  bolus;Impaired anterior to posterior transit;Reduced lingual movement/coordination;Reduced labial seal    Nectar Thick Nectar Thick Liquid: Not tested    Honey Thick Honey Thick Liquid: Not tested   Puree Puree: Not tested   Solid   GO    Solid: Not tested      Harlon Ditty, MA CCC-SLP [phone]  Hend Mccarrell, Riley Nearing 04/28/2014,9:52 AM

## 2014-04-28 NOTE — Progress Notes (Addendum)
Multiple attempts to insert NG tube with no success.  MD notified.  Mouth care performed.  Will continue to monitor. Sondra Come, RN

## 2014-04-28 NOTE — Progress Notes (Signed)
I was not able to obtain consent for TEE given questionable mental status, slurring of speech. I tried to obtain consent from her family member, home phone number appears to be busy, no one picked up cell phone call and voicemail has not been set up.  I will try to obtain consent over the weekend either through the patient or her family member. She's currently NPO as she failed swallow eval, she will need to be NPO for at least 6 hrs prior to the procedure. NPO order will need to be placed for Monday morning if she resume PO status over the next few days  Signed, Azalee Course PA Pager: [phone]

## 2014-04-28 NOTE — Progress Notes (Signed)
STROKE TEAM PROGRESS NOTE   HISTORY [name] is a 69 y.o. female with a history of atrial fibrillation and recent hospitalization for sepsis, pneumonia rhabdomyolysis as well as hypernatremia and acute kidney injury, brought to the emergency room at [facility] for increased confusion and speech abnormality. Serum sodium was 169. CT scan of her head showed low density areas involving large area of left MCA territory as well as right temporal abnormality, with appearance indicative of probable subacute infarctions. MRI was recommended but could not be obtained because of patient's agitated state. She was transferred here for further evaluation and management with stroke service intervention.  LSN: Unclear tPA Given: No: CT abnormalities is an on presentation, as well as unclear when last known well. mRankin:  SUBJECTIVE (INTERVAL HISTORY) A close friend of the patient's is at the bedside. She is able to provide some history. She reports that the patient has been confused for 2 weeks and prior to this had not seen a physician in 17 years. Later, pt husband arrived and we were able to get further information from husband.   According to her husband and friend, she was admitted to [facility] about 2 weeks ago for confusion and agitation. Found to have sepsis, pneumonia, and was treated there. However, husband and friend reported that her mental status never got better but she was discharged on 04/25/14. At that time Na 154. No head imaging was done.  After discharge, she was at home still having global aphasia. More agitated and restless. She was sent to [facility] for evaluation. The patient appears to have global aphasia and there is no meaningful communication.   OBJECTIVE Temp:  [97.5 F (36.4 C)-100.2 F (37.9 C)] 99.6 F (37.6 C) (01/01 2044) Pulse Rate:  [72-95] 88 (01/01 2044) Cardiac Rhythm:  [-] Normal sinus rhythm;Sinus tachycardia (01/01 0800) Resp:  [18-22] 20 (01/01 2044) BP:  (97-162)/(58-95) 144/79 mmHg (01/01 2044) SpO2:  [94 %-100 %] 94 % (01/01 2044) Weight:  [244 lb 12.8 oz (111.041 kg)] 244 lb 12.8 oz (111.041 kg) (12/31 2328)   Recent Labs Lab 04/28/14 0407 04/28/14 0932 04/28/14 1126 04/28/14 1702 04/28/14 2043  GLUCAP 102* 104* 100* 104* 112*    Recent Labs Lab 04/24/14 0404 04/25/14 0715 04/27/14 1425 04/27/14 1504 04/28/14 0634 04/28/14 1832  NA 154* 154* 169* 166* 168* 169*  K 3.1* 3.8  --  3.2* 3.5 3.0*  CL 123* 122*  --  128* >130* >130*  CO2 26 27  --  GLUCOSE 128* 104*  --  102* 89 119*  BUN 17 16  --  CREATININE 1.59* 1.74* 1.7* 1.66* 1.84* 1.95*  CALCIUM 8.5 8.1*  --  8.7 8.7 8.6    Recent Labs Lab 04/27/14 1504  AST 33  ALT 17  ALKPHOS 315*  BILITOT 0.7  PROT 6.9  ALBUMIN 2.5*    Recent Labs Lab 04/22/14 0533 04/24/14 0404 04/27/14 1504 04/28/14 0634  WBC 10.8* 13.7* 18.8* 21.1*  NEUTROABS  --   --  16.3*  --   HGB 11.4* 12.0 12.2 12.3  HCT 34.4* 37.2 40.0 37.6  MCV 89.8 91.4 95.9 95.2  PLT 229 201 181 177    Recent Labs Lab 04/22/14 0533 04/27/14 1504  CKTOTAL 96 43  TROPONINI  --  0.06*   No results for input(s): LABPROT, INR in the last 72 hours.  Recent Labs  04/27/14 1728  COLORURINE YELLOW  LABSPEC 1.009  PHURINE  5.0  GLUCOSEU NEGATIVE  HGBUR TRACE*  BILIRUBINUR NEGATIVE  KETONESUR NEGATIVE  PROTEINUR NEGATIVE  UROBILINOGEN 0.2  NITRITE NEGATIVE  LEUKOCYTESUR SMALL*    No results found for: CHOL, TRIG, HDL, CHOLHDL, VLDL, LDLCALC No results found for: HGBA1C    Component Value Date/Time   LABOPIA POSITIVE* 04/18/2014 1754   COCAINSCRNUR NONE DETECTED 04/18/2014 1754   LABBENZ NONE DETECTED 04/18/2014 1754   AMPHETMU NONE DETECTED 04/18/2014 1754   THCU NONE DETECTED 04/18/2014 1754   LABBARB NONE DETECTED 04/18/2014 1754    No results for input(s): ETH in the last 168 hours.  Ct Head Wo Contrast 04/27/2014    Moderate region of decreased density in  the left parietal-occipital lobe concerning for subacute infarct. There is additional questionable region of decreased density in the right temporal lobe. Given the questioned multifocal involvement, MRI of the brain, preferably with contrast, recommended for further evaluation to exclude the possibility of underlying mass lesions.    MRI not able to be done due to agitation  Dg Chest [facility] 04/27/2014    No acute cardiopulmonary abnormality seen.  2D echo 04/18/14   - Left ventricle: The cavity size was normal. Wall thickness was normal. Systolic function was normal. The estimated ejection fraction was in the range of 55% to 60%. - Left atrium: The atrium was mildly dilated.  Impressions: - Overall very poor image quality.  CUS - pending  Venous doppler - pending   PHYSICAL EXAM  Temp:  [97.5 F (36.4 C)-100.2 F (37.9 C)] 99.6 F (37.6 C) (01/01 2044) Pulse Rate:  [72-95] 88 (01/01 2044) Resp:  [18-22] 20 (01/01 2044) BP: (97-162)/(58-95) 144/79 mmHg (01/01 2044) SpO2:  [94 %-100 %] 94 % (01/01 2044) Weight:  [244 lb 12.8 oz (111.041 kg)] 244 lb 12.8 oz (111.041 kg) (12/31 2328)  General - Well nourished, well developed, restless in bed, global aphasia.  Ophthalmologic - not able to test due to agitation.  Cardiovascular - Regular rate and rhythm with no murmur.  Neuro - awake alert, but restless and agitated, global aphasia, do not following simple commands, word salad, not able to name or repeat. No facial asymmetry. PERRL, EOMI, move all extremities spontaneously and equally. Reflex 1+ and no babinski  ASSESSMENT/PLAN [name] is a 69 y.o. female with history of atrial fibrillation, diabetes mellitus, recent sepsis, presenting with confusion and speech abnormalities . She did not receive IV t-PA due to unknown time of onset.  Stroke: Bilateral MCA embolic infarcts - etiology not clear but likely due to atrial fibrillation or  Endocarditis due to  sepsis.  Resultant  confusion/global aphasia  MRI   (not yet performed secondary to agitation)   Carotid Doppler  pending  2D Echo EF 55-60%. Poor images. No cardiac source of emboli identified.  LE doppler - pending  LDL  ordered  HgbA1c  ordered  Lovenox for VTE prophylaxis   Diet NPO time specified no liquids  aspirin 81 mg orally every day prior to admission, now on aspirin 300 mg suppository daily  Ongoing aggressive stroke risk factor management  Therapy recommendations:  Pending  Disposition:  Pending  AMS - could be due to hypernatremia - likely due to dehydration - need to correct slowly. - recommend IV and PO fluid. - bilateral MCA stroke can cause AMS - seizure needs to be ruled out - EEG pending  Hx of Afib - on documentation - family stated that they have not being told about hx  of Afib - if comfirmed, she need life long anticoagulation - continue tele  Sepsis - treated in [facility]  - no brain image at that time - need repeat blood culture - schedule TEE in monday  Hypertension  Home meds:  Lopressor 50 mg twice a day  Stable  Hyperlipidemia  Home meds:  No lipid lowering medications prior to admission.  LDL pending, goal < 70  Continue statin at discharge  Diabetes  HgbA1c pending goal < 7.0  Controlled  Other Stroke Risk Factors  Advanced age  Obesity, Body mass index is 44.76 kg/(m^2).    Other Active Problems  Hypokalemia - 3.2 - repeat pending.  Elevated creatinine  Leukocytosis - blood cultures pending  Other Pertinent History  No regular medical follow-up for 17 years.  Hospital day # 1  Delton See PA-C [facility] Pager [phone] 04/28/2014, 11:13 PM  I, the attending vascular neurologist, have personally obtained a history, examined the patient, evaluated laboratory data, individually viewed imaging studies and agree with radiology interpretations. I also obtained additional history from  pt's husband and friend at bedside. I also discussed with Dr. Catha Gosselin regarding her care plan. Together with the NP/PA, we formulated the assessment and plan of care which reflects our mutual decision.  I have made any additions or clarifications directly to the above note and agree with the findings and plan as currently documented.   69 yo F was admitted for two weeks of confusion, restless and agitation. CT head showed bilateral MCA stroke. Etiology not clear. ? Family hx of afib, will need further evaluation. She also had recent sepsis, will do blood cutlure and TEE. She also has hypernatremia due to dehydration, will give free water. Bring down Na slowly. Follow up LE DVT screening.  Marvel Plan, MD PhD Stroke Neurology 04/28/2014 11:32 PM   To contact Stroke Continuity provider, please refer to [email or web address]. After hours, contact General Neurology

## 2014-04-28 NOTE — Progress Notes (Signed)
CRITICAL VALUE ALERT  Critical value received:  Sodium 139 and Chloride >130 Date of notification:  04/28/2014   Time of notification:  7:45 PM  Critical value read back: Yes  Nurse who received alert:  Estanislado Emms   MD notified (1st page):  Burnadette Peter   Time of first page:  8:00 PM  MD notified (2nd page):  Time of second page:  Responding MD: Burnadette Peter  Time MD responded:  8:05 PM

## 2014-04-28 NOTE — Progress Notes (Addendum)
[facility]                                                                              Patient Demographics  [name], is a 69 y.o. female, DOB - [birth year], UEA:[id number]  Admit date - 04/27/2014   Admitting Physician Maryruth Bun Rama, MD  Outpatient Primary MD for the patient is REED, TIFFANY, DO  LOS - 1   Chief Complaint  Patient presents with  . Altered Mental Status      HPI on 04/27/2014 by Dr. Trula Ore Rama [name] is an 69 y.o. female with a PMH of atrial fibillation, recently hospitalized 04/17/14-04/25/14 for treatment of sepsis, pneumonia, rhabdomyolysis, and demand ischemia from afib w/ RVR. She was also hypernatremic at discharge with a sodium of 154 and a creatinine of 1.74. She was discharged on lasix. According to notes, she was back to her normal baseline mental status at discharge. She had routine labs drawn today, for hospital follow up of electrolytes, and her sodium had increased to 169. Her creatinine is 1.66. Her mental status has deteriorated and she has been weak all day, according to family, who visited her today at her facility to have lunch with her. The patient is very confused and unable to provide any additional information. Denies pain, shortness of breath and cough. Denies nausea and vomiting. Keeps repeating "I need air" over and over again.  Assessment & Plan   Acute  Encephalopathy/?Bilateral MCA territory Infarcts -CT head: moderate region of decreased density in left parietal-occipital lobe -MRI brain pending -Neurology consulted and appreciated -Speech, PT, OT consulted  Hypernatremia/Hyperchloremia -Likely secondary to dehydration and poor oral intake complicated by diuretics -Lasix held -Continue IVF, but will switch to D[facility] with nephrology via phone) -Continue to monitor BMP  Hypokalemia -Secondary to diurectics -Will replace and continue to monitor  Weakness -Cannot assess at this time -per brother,  patient has been weak and not eating properly for some time  Yeast Dermatitis -Continue lamisil  Atrial fibrillation -Currently rate controlled -Continue metoprolol and aspirin  Diabetes mellitus, Type 2 -Continue lantus, ISS and CBG monitoring  Chronic kidney disease, stage III -Cr at baseline, continue to monitor BMP  Code Status: Full  Family Communication: Family at bedside  Disposition Plan: Admitted  Time Spent in minutes   30 minutes  Procedures  None  Consults   Neurology Nephrology, via phone  DVT Prophylaxis  Lovenox  Lab Results  Component Value Date   PLT 177 04/28/2014    Medications  Scheduled Meds: . antiseptic oral rinse  7 mL Mouth Rinse BID  . enoxaparin (LOVENOX) injection  60 mg Subcutaneous Q24H  . Influenza vac split quadrivalent PF  0.5 mL Intramuscular Tomorrow-1000  . insulin aspart  0-9 Units Subcutaneous Q4H  . sodium chloride  3 mL Intravenous Q12H  . terbinafine   Topical BID   Continuous Infusions: . 0.45 % NaCl with KCl 20 mEq / L 125 mL/hr at 04/28/14 0057   PRN Meds:.[DISCONTINUED] acetaminophen **OR** acetaminophen, [DISCONTINUED] ondansetron **OR** ondansetron (ZOFRAN) IV  Antibiotics    Anti-infectives    None      Subjective:   [name] seen and  examined today.  Patient wants to be in the "air".  She is unable to answer questions.  Objective:   Filed Vitals:   04/28/14 0130 04/28/14 0401 04/28/14 0556 04/28/14 1000  BP: 147/73 136/58 97/82 152/79  Pulse: 80 90 91 84  Temp: 98.6 F (37 C) 98.8 F (37.1 C) 99.1 F (37.3 C) 100.2 F (37.9 C)  TempSrc: Oral Oral Oral Axillary  Resp: Height:      Weight:      SpO2: 97% 96% 97% 97%    Wt Readings from Last 3 Encounters:  04/27/14 111.041 kg (244 lb 12.8 oz)  04/20/14 114.7 kg (252 lb 13.9 oz)     Intake/Output Summary (Last 24 hours) at 04/28/14 1209 Last data filed at 04/27/14 1707  Gross per 24 hour  Intake    500 ml  Output       0 ml  Net    500 ml    Exam  General: Well developed, well nourished, NAD, appears stated age  HEENT: NCAT, mucous membranes moist.   Cardiovascular: S1 S2 auscultated, irregular  Respiratory: Clear to auscultation bilaterally with equal chest rise  Abdomen: Soft, nontender, nondistended, + bowel sounds  Extremities: warm dry without cyanosis clubbing.  Trace edema LLE  Neuro: AAOx1, does not follow commands, CN testing limited  Data Review   Micro Results Recent Results (from the past 240 hour(s))  Clostridium Difficile by PCR     Status: None   Collection Time: 04/23/14  7:07 AM  Result Value Ref Range Status   C difficile by pcr NEGATIVE NEGATIVE Final    Comment: Performed at [facility]  MRSA PCR Screening     Status: None   Collection Time: 04/27/14  7:04 PM  Result Value Ref Range Status   MRSA by PCR NEGATIVE NEGATIVE Final    Comment:        The GeneXpert MRSA Assay (FDA approved for NASAL specimens only), is one component of a comprehensive MRSA colonization surveillance program. It is not intended to diagnose MRSA infection nor to guide or monitor treatment for MRSA infections.     Radiology Reports Ct Abdomen Pelvis Wo Contrast  04/18/2014   CLINICAL DATA:  Status post fall; found on floor. Sepsis, with progressive weakness for several weeks. Erythema at the groin and abdominal wall. Leukocytosis. Initial encounter.  EXAM: CT ABDOMEN AND PELVIS WITHOUT CONTRAST  TECHNIQUE: Multidetector CT imaging of the abdomen and pelvis was performed following the standard protocol without IV contrast.  COMPARISON:  Lumbar spine radiograph performed 04/17/2014  FINDINGS: Right lower lobe airspace opacification is compatible with pneumonia.  The liver and spleen are unremarkable in appearance. Scattered stones are seen within the gallbladder. The gallbladder is otherwise unremarkable. The pancreas and adrenal glands are unremarkable.  Nonspecific perinephric  stranding is noted bilaterally. Mild right-sided pelvicaliectasis remains within normal limits. There is no evidence of hydronephrosis. No renal or ureteral stones are seen.  No free fluid is identified. The small bowel is unremarkable in appearance. The stomach is within normal limits. No acute vascular abnormalities are seen. Minimal calcification is seen along the abdominal aorta and its branches.  The appendix is not definitely seen; there is no evidence for appendicitis. The colon is unremarkable in appearance.  A small-to-moderate umbilical hernia is noted, containing only fat.  The bladder is largely decompressed and grossly unremarkable. A large 8.0 cm fibroid is suspected within the uterus. The uterus is mildly enlarged.  The ovaries are grossly symmetric. No suspicious adnexal masses are seen. Stop No inguinal lymphadenopathy is seen.  No acute osseous abnormalities are identified.  IMPRESSION: 1. Right lower lobe pneumonia noted; this likely explains the patient's symptoms. 2. Cholelithiasis; gallbladder otherwise unremarkable. 3. Small to moderate umbilical hernia, containing only fat. 4. Mildly enlarged fibroid uterus noted.   Electronically Signed   By: Roanna Raider M.D.   On: 04/18/2014 05:07   Dg Chest 1 View  04/28/2014   CLINICAL DATA:  Fever. History of atrial fibrillation. Patient had a recent hospitalization for sepsis and pneumonia.  EXAM: CHEST - 1 VIEW  COMPARISON:  April 27, 2014  FINDINGS: The heart size and mediastinal contours are stable. The heart size is enlarged. There is mild diffuse increased pulmonary interstitium. There is no focal pneumonia or pleural effusion. The visualized skeletal structures are stable.  IMPRESSION: Mild congestive heart failure.   Electronically Signed   By: Sherian Rein M.D.   On: 04/28/2014 08:38   Dg Chest 2 View  04/17/2014   CLINICAL DATA:  Pt arrived via EMS with report of falling without injury d/t "legs given out" on concrete garage floor  since 1300 and was found by spouse ast 1845. EMS reported that CBG was greater than 600, poor appetite, noncompliant with diet d/t noted soft drinks and sweeten tea in house, strong urine smell, and red rash to abd folds. Pt c/o lower back pain, large bruising to left hip and groin, denies any chest complaints, best obtainable images due to pt size and condition  EXAM: CHEST  2 VIEW  COMPARISON:  None.  FINDINGS: There are low lung volumes with patient rotation to the right. The lateral view is limited. There is elevation of the right hemidiaphragm. The aorta appears ectatic and somewhat unfolded. The heart size is normal. Mild atelectasis is present at the right lung base. There is no consolidation, significant pleural effusion or pneumothorax. No acute osseous findings identified.  IMPRESSION: Elevated right hemidiaphragm with associated right basilar atelectasis. No consolidation or significant edema.   Electronically Signed   By: Roxy Horseman M.D.   On: 04/17/2014 21:36   Dg Lumbar Spine 2-3 Views  04/17/2014   CLINICAL DATA:  Fall.  Weakness.  Initial evaluation.  EXAM: LUMBAR SPINE - 2-3 VIEW  COMPARISON:  None.  FINDINGS: Five lumbar type vertebral bodies are present. There is mild levoscoliosis. Vertebral bodies are otherwise normally aligned with preservation of the normal lumbar lordosis.  There is anterior wedging of the L1 and L2 vertebral bodies, favored to be chronic in nature. Vertebral body heights are otherwise preserved. No definite acute fracture listhesis.  No soft tissue abnormality.  Moderate multilevel degenerative disc disease present within the visualized spine.  IMPRESSION: 1. No definite acute traumatic injury within the lumbar spine. 2. Anterior wedging of the L1 and L2 vertebral bodies, favored to be chronic in nature. Possible acute compression fractures are not entirely excluded. Correlation with site of pain recommended.   Electronically Signed   By: Rise Mu M.D.    On: 04/17/2014 21:37   Dg Pelvis 1-2 Views  04/17/2014   CLINICAL DATA:  Patient fell on concrete floor earlier in the day  EXAM: PELVIS - 1-2 VIEW  COMPARISON:  None.  FINDINGS: There is no evidence of pelvic fracture or dislocation. 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 No erosive change.  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 No fracture or dislocation.   Electronically Signed   By: Bretta Bang M.D.   On: 04/17/2014 21:37   Dg Tibia/fibula Left  04/17/2014   CLINICAL DATA:  Status post fall; acute onset of left lower leg pain. Initial encounter.  EXAM: LEFT TIBIA AND FIBULA - 2 VIEW  COMPARISON:  None.  FINDINGS: There is no evidence of fracture or dislocation. The tibia and fibula appear intact.  Marginal osteophytes are seen arising at the medial and lateral compartments of the knee, with wall osteophytes also seen. No knee joint effusion is identified.  The ankle mortise is incompletely assessed, but appears grossly unremarkable. A plantar calcaneal spur is incidentally noted. No significant soft tissue abnormalities are identified.  IMPRESSION: No evidence of fracture or dislocation.   Electronically Signed   By: Roanna Raider M.D.   On: 04/17/2014 22:52   Ct Head Wo Contrast  04/27/2014   CLINICAL DATA:  Altered mental status.  EXAM: CT HEAD WITHOUT CONTRAST  TECHNIQUE: Contiguous axial images were obtained from the base of the skull through the vertex without intravenous contrast.  COMPARISON:  None.  FINDINGS: There is ill-defined decreased density involving the moderate portion of the left posterior parietal occipital lobes extending from the lateral ventricle of the cortex. There is an additional questionable region of decreased density in the right temporal lobe versus volume averaging. There is no associated hemorrhage. There is no midline  shift. There is mild prominence of the extra-axial CSF space on the right, may be related to atrophy versus subdural hygroma. The calvarium is intact. Included paranasal sinuses and mastoid air cells are well aerated.  IMPRESSION: Moderate region of decreased density in the left parietal-occipital lobe concerning for subacute infarct. There is additional questionable region of decreased density in the right temporal lobe. Given the questioned multifocal involvement, MRI of the brain, preferably with contrast, recommended for further evaluation to exclude the possibility of underlying mass lesions.  These results were called by telephone at the time of interpretation on 04/27/2014 at 3:28 pm to Dr. Pricilla Loveless , who verbally acknowledged these results.   Electronically Signed   By: Rubye Oaks M.D.   On: 04/27/2014 15:31   Dg Chest Port 1 View  04/27/2014   CLINICAL DATA:  Altered mental status.  EXAM: PORTABLE CHEST - 1 VIEW  COMPARISON:  April 17, 2014.  FINDINGS: The heart size and mediastinal contours are within normal limits. Both lungs are clear. No pneumothorax or pleural effusion is noted. The visualized skeletal structures are unremarkable.  IMPRESSION: No acute cardiopulmonary abnormality seen.   Electronically Signed   By: Roque Lias M.D.   On: 04/27/2014 14:43    CBC  Recent Labs Lab 04/22/14 0533 04/24/14 0404 04/27/14 1504 04/28/14 0634  WBC 10.8* 13.7* 18.8* 21.1*  HGB 11.4* 12.0 12.2 12.3  HCT 34.4* 37.2 40.0 37.6  PLT 229 201 181 177  MCV 89.8 91.4 95.9 95.2  MCH 29.8 29.5 29.3 31.1  MCHC 33.1 32.3 30.5 32.7  RDW 14.4 15.2 16.7* 16.9*  LYMPHSABS  --   --  1.5  --   MONOABS  --   --  0.8  --   EOSABS  --   --  0.1  --   BASOSABS  --   --  0.0  --     Chemistries   Recent Labs Lab 04/22/14 0533 04/24/14 0404 04/25/14 0715  04/27/14 1425 04/27/14 1504 04/28/14 0634  NA 144 154* 154* 169* 166* 168*  K 3.0* 3.1* 3.8  --  3.2* 3.5  CL 116* 123* 122*  --   128* >130*  CO2 23 26 27   --  29 26  GLUCOSE 141* 128* 104*  --  102* 89  BUN 18 17 16   --  17 16  CREATININE 1.53* 1.59* 1.74* 1.7* 1.66* 1.84*  CALCIUM 8.1* 8.5 8.1*  --  8.7 8.7  AST  --   --   --   --  33  --   ALT  --   --   --   --  17  --   ALKPHOS  --   --   --   --  315*  --   BILITOT  --   --   --   --  0.7  --    ------------------------------------------------------------------------------------------------------------------ estimated creatinine clearance is 33.9 mL/min (by C-G formula based on Cr of 1.84). ------------------------------------------------------------------------------------------------------------------ No results for input(s): HGBA1C in the last 72 hours. ------------------------------------------------------------------------------------------------------------------ No results for input(s): CHOL, HDL, LDLCALC, TRIG, CHOLHDL, LDLDIRECT in the last 72 hours. ------------------------------------------------------------------------------------------------------------------ No results for input(s): TSH, T4TOTAL, T3FREE, THYROIDAB in the last 72 hours.  Invalid input(s): FREET3 ------------------------------------------------------------------------------------------------------------------ No results for input(s): VITAMINB12, FOLATE, FERRITIN, TIBC, IRON, RETICCTPCT in the last 72 hours.  Coagulation profile No results for input(s): INR, PROTIME in the last 168 hours.  No results for input(s): DDIMER in the last 72 hours.  Cardiac Enzymes  Recent Labs Lab 04/27/14 1504  TROPONINI 0.06*   ------------------------------------------------------------------------------------------------------------------ Invalid input(s): POCBNP    Cera Rorke D.O. on 04/28/2014 at 12:09 PM  Between 7am to 7pm - Pager - [phone]  After 7pm go to [email or web address] - password [email or web address]  And look for the night coverage person covering for me after hours  [facility] Office  [phone]

## 2014-04-29 DIAGNOSIS — I639 Cerebral infarction, unspecified: Secondary | ICD-10-CM

## 2014-04-29 LAB — BASIC METABOLIC PANEL
BUN: 17 mg/dL (ref 6–23)
CO2: 26 mmol/L (ref 19–32)
CREATININE: 1.96 mg/dL — AB (ref 0.50–1.10)
Calcium: 8.1 mg/dL — ABNORMAL LOW (ref 8.4–10.5)
Chloride: >130 mEq/L (ref 96–112)
GFR calc Af Amer: 29 mL/min — ABNORMAL LOW (ref >90)
GFR, EST NON AFRICAN AMERICAN: 25 mL/min — AB (ref >90)
Glucose, Bld: 145 mg/dL — ABNORMAL HIGH (ref 70–99)
Potassium: 3.1 mmol/L — ABNORMAL LOW (ref 3.5–5.1)
Sodium: 167 mmol/L (ref 135–145)

## 2014-04-29 LAB — LIPID PANEL
CHOLESTEROL: 101 mg/dL (ref 0–200)
HDL: 26 mg/dL — AB (ref >39)
LDL Cholesterol: 39 mg/dL (ref 0–99)
Total CHOL/HDL Ratio: 3.9 RATIO
Triglycerides: 182 mg/dL — ABNORMAL HIGH (ref <150)
VLDL: 36 mg/dL (ref 0–40)

## 2014-04-29 LAB — GLUCOSE, CAPILLARY
GLUCOSE-CAPILLARY: 142 mg/dL — AB (ref 70–99)
GLUCOSE-CAPILLARY: 126 mg/dL — AB (ref 70–99)
Glucose-Capillary: 129 mg/dL — ABNORMAL HIGH (ref 70–99)
Glucose-Capillary: 142 mg/dL — ABNORMAL HIGH (ref 70–99)
Glucose-Capillary: 117 mg/dL — ABNORMAL HIGH (ref 70–99)
Glucose-Capillary: 130 mg/dL — ABNORMAL HIGH (ref 70–99)

## 2014-04-29 LAB — MAGNESIUM: MAGNESIUM: 1.8 mg/dL (ref 1.5–2.5)

## 2014-04-29 MED ORDER — HALOPERIDOL LACTATE 5 MG/ML IJ SOLN
1.0000 mg | Freq: Three times a day (TID) | INTRAMUSCULAR | Status: DC | PRN
  Administered 2014-04-29 – 2014-04-30 (×2): 1 mg via INTRAMUSCULAR
  Administered 2014-04-30: 2.5 mg via INTRAMUSCULAR
  Filled 2014-04-29 (×4): qty 1

## 2014-04-29 MED ORDER — QUETIAPINE FUMARATE 25 MG PO TABS
25.0000 mg | ORAL_TABLET | Freq: Every day | ORAL | Status: DC
  Administered 2014-05-01: 25 mg via ORAL
  Filled 2014-04-29 (×2): qty 1

## 2014-04-29 MED ORDER — POTASSIUM CHLORIDE 10 MEQ/100ML IV SOLN
10.0000 meq | Freq: Once | INTRAVENOUS | Status: AC
Start: 2014-04-29 — End: 2014-04-29
  Administered 2014-04-29: 10 meq via INTRAVENOUS

## 2014-04-29 MED ORDER — ENOXAPARIN SODIUM 60 MG/0.6ML ~~LOC~~ SOLN
50.0000 mg | SUBCUTANEOUS | Status: DC
  Administered 2014-04-29: 100 mg via SUBCUTANEOUS
  Administered 2014-04-30 – 2014-05-02 (×3): 50 mg via SUBCUTANEOUS
  Filled 2014-04-29 (×6): qty 0.6

## 2014-04-29 MED ORDER — WHITE PETROLATUM GEL
Status: AC
  Administered 2014-04-29: 16:00:00
  Filled 2014-04-29: qty 5

## 2014-04-29 MED ORDER — POTASSIUM CHLORIDE 10 MEQ/100ML IV SOLN
10.0000 meq | INTRAVENOUS | Status: AC
  Administered 2014-04-29: 10 meq via INTRAVENOUS
  Filled 2014-04-29: qty 100

## 2014-04-29 NOTE — Progress Notes (Signed)
Unable to obtain consent today for TEE either. At first it appears she respond to question, however as i was discussing risk and benefit with her, she felt asleep. I have called her family, house phone number stays busy, no one is picking up the cell phone and voicemail has not been set up. I have discussed with nursing staff, who will update the contact information the next family visits. If family is staying for more a hour between 7:30am to 5:30p, nursing staff should page cardiology who will come to obtain consent. I will attempt again tomorrow.  Ramond Dial PA Pager: [phone]

## 2014-04-29 NOTE — Progress Notes (Addendum)
INITIAL NUTRITION ASSESSMENT  DOCUMENTATION CODES Per approved criteria  -Morbid Obesity   INTERVENTION: Once NG tube is placed, Initiate Glucerna 1.2 @ 20 ml/hr via NGT and increase by 10 ml every 4 hours to goal rate of 65 ml/hr.   Tube feeding regimen will provide 1872 kcal (100% of needs), 94 grams of protein, and 1264 ml of H2O.    NUTRITION DIAGNOSIS: Inadequate oral intake related to AMS as evidenced by NPO status.   Goal: Pt to meet >/= 90% of their estimated nutrition needs   Monitor:  Tube placement, TF initiation, weight trend, labs  Reason for Assessment: Consult for TF initiation and management  69 y.o. female  Admitting Dx: Hypernatremia  ASSESSMENT: 69 y.o. female with a history of atrial fibrillation and recent hospitalization for sepsis, pneumonia rhabdomyolysis as well as hypernatremia and acute kidney injury, brought to the emergency room at [facility] for increased confusion and speech abnormality. Serum sodium was 169. CT scan of her head showed low density areas involving large area of left MCA territory as well as right temporal abnormality, with appearance indicative of probable subacute infarctions.  RD consulted for TF initiation and management. Pt being assessed by SLP at time of visit; per SLP pt has poor oral care with mouth "caked". NGT has not been placed. Pt remains NPO. Pt seen by nutrition team 12/28 at which time pt reported minimal PO intake during admission x 7 days, altered taste and smell changes, and usual body weight of 250 lbs. Pt's weight has dropped 14 lbs in the past 2 weeks. Per MD note, "Family reports patient has not eaten in 3 weeks"  Labs: elevated sodium and chloride, low calcium, decreased GFR, low potassium, elevated triglycerides  Height: Ht Readings from Last 1 Encounters:  04/27/14  (1.575 m)    Weight: Wt Readings from Last 1 Encounters:  04/29/14 238 lb 1.6 oz (108.001 kg)    Ideal Body Weight: 110  lbs  % Ideal Body Weight: 216%  Wt Readings from Last 10 Encounters:  04/29/14 238 lb 1.6 oz (108.001 kg)  04/20/14 252 lb 13.9 oz (114.7 kg)    Usual Body Weight: 250 lbs  % Usual Body Weight: 95%  BMI:  Body mass index is 43.54 kg/(m^2). (Morbid Obesity)  Estimated Nutritional Needs: Kcal: 1700-1950 Protein: 90-100 grams Fluid: 2.8 L/day  Skin: +1 generalized edema, +1 RLE edema, +2 LLE edema  Diet Order: Diet NPO time specified  EDUCATION NEEDS: -No education needs identified at this time  No intake or output data in the 24 hours ending 04/29/14 1553  Last BM: 12/28  Labs:   Recent Labs Lab 04/28/14 0634 04/28/14 1832 04/29/14 0350  NA 168* 169* 167*  K 3.5 3.0* 3.1*  CL >130* >130* >130*  CO2 BUN CREATININE 1.84* 1.95* 1.96*  CALCIUM 8.7 8.6 8.1*  MG  --   --  1.8  GLUCOSE 89 119* 145*    CBG (last 3)   Recent Labs  04/29/14 0441 04/29/14 0829 04/29/14 1250  GLUCAP 142* 142* 126*    Scheduled Meds: . antiseptic oral rinse  7 mL Mouth Rinse BID  . enoxaparin (LOVENOX) injection  50 mg Subcutaneous Q24H  . free water  50 mL Per Tube 3 times per day  . Influenza vac split quadrivalent PF  0.5 mL Intramuscular Tomorrow-1000  . insulin aspart  0-9 Units Subcutaneous Q4H  . potassium chloride  10 mEq  Intravenous Once  . QUEtiapine  25 mg Oral QHS  . sodium chloride  3 mL Intravenous Q12H  . terbinafine   Topical BID    Continuous Infusions: . dextrose 5 % 1,000 mL with potassium chloride 20 mEq infusion 150 mL/hr at 04/29/14 4098    Past Medical History  Diagnosis Date  . Thyroid disease   . Rhabdomyolysis 04/18/2014  . Hyperosmolar non-ketotic state in patient with type 2 diabetes mellitus 04/17/2014  . AKI (acute kidney injury) 04/18/2014  . Sepsis 04/18/2014  . Atrial fibrillation 04/27/2014  . Diabetes mellitus, type 2 04/27/2014    Past Surgical History  Procedure Laterality Date  . Cholecystectomy       Ian Malkin RD, LDN Inpatient Clinical Dietitian Pager: [phone] After Hours Pager: [phone]

## 2014-04-29 NOTE — Progress Notes (Signed)
STROKE TEAM PROGRESS NOTE   HISTORY [name] is a 69 y.o. female with a history of atrial fibrillation and recent hospitalization for sepsis, pneumonia rhabdomyolysis as well as hypernatremia and acute kidney injury, brought to the emergency room at [facility] for increased confusion and speech abnormality. Serum sodium was 169. CT scan of her head showed low density areas involving large area of left MCA territory as well as right temporal abnormality, with appearance indicative of probable subacute infarctions. MRI was recommended but could not be obtained because of patient's agitated state. She was transferred here for further evaluation and management with stroke service intervention.  LSN: Unclear tPA Given: No: CT abnormalities is an on presentation, as well as unclear when last known well. mRankin:  SUBJECTIVE (INTERVAL HISTORY) No friends or family members present today. The patient did not answer questions or follow commands at this time.  OBJECTIVE Temp:  [97.5 F (36.4 C)-100.2 F (37.9 C)] 99.1 F (37.3 C) (01/02 0648) Pulse Rate:  [70-95] 79 (01/02 9604) Cardiac Rhythm:  [-] Sinus tachycardia;Atrial fibrillation (01/01 2045) Resp:  [18-22] 20 (01/02 0648) BP: (121-162)/(47-80) 127/71 mmHg (01/02 0648) SpO2:  [94 %-98 %] 95 % (01/02 0648) Weight:  [238 lb 1.6 oz (108.001 kg)] 238 lb 1.6 oz (108.001 kg) (01/02 0500)   Recent Labs Lab 04/28/14 1126 04/28/14 1702 04/28/14 2043 04/29/14 0035 04/29/14 0441  GLUCAP 100* 104* 112* 129* 142*    Recent Labs Lab 04/25/14 0715 04/27/14 1425 04/27/14 1504 04/28/14 0634 04/28/14 1832 04/29/14 0350  NA 154* 169* 166* 168* 169* 167*  K 3.8  --  3.2* 3.5 3.0* 3.1*  CL 122*  --  128* >130* >130* >130*  CO2 27  --  GLUCOSE 104*  --  102* 89 119* 145*  BUN 16  --  CREATININE 1.74* 1.7* 1.66* 1.84* 1.95* 1.96*  CALCIUM 8.1*  --  8.7 8.7 8.6 8.1*    Recent Labs Lab 04/27/14 1504   AST 33  ALT 17  ALKPHOS 315*  BILITOT 0.7  PROT 6.9  ALBUMIN 2.5*    Recent Labs Lab 04/24/14 0404 04/27/14 1504 04/28/14 0634  WBC 13.7* 18.8* 21.1*  NEUTROABS  --  16.3*  --   HGB 12.0 12.2 12.3  HCT 37.2 40.0 37.6  MCV 91.4 95.9 95.2  PLT 201 181 177    Recent Labs Lab 04/27/14 1504  CKTOTAL 43  TROPONINI 0.06*   No results for input(s): LABPROT, INR in the last 72 hours.  Recent Labs  04/27/14 1728  COLORURINE YELLOW  LABSPEC 1.009  PHURINE 5.0  GLUCOSEU NEGATIVE  HGBUR TRACE*  BILIRUBINUR NEGATIVE  KETONESUR NEGATIVE  PROTEINUR NEGATIVE  UROBILINOGEN 0.2  NITRITE NEGATIVE  LEUKOCYTESUR SMALL*       Component Value Date/Time   CHOL 101 04/29/2014 0350   TRIG 182* 04/29/2014 0350   HDL 26* 04/29/2014 0350   CHOLHDL 3.9 04/29/2014 0350   VLDL 36 04/29/2014 0350   LDLCALC 39 04/29/2014 0350   No results found for: HGBA1C    Component Value Date/Time   LABOPIA POSITIVE* 04/18/2014 1754   COCAINSCRNUR NONE DETECTED 04/18/2014 1754   LABBENZ NONE DETECTED 04/18/2014 1754   AMPHETMU NONE DETECTED 04/18/2014 1754   THCU NONE DETECTED 04/18/2014 1754   LABBARB NONE DETECTED 04/18/2014 1754    No results for input(s): ETH in the last 168 hours.  Ct Head Wo Contrast 04/27/2014  Moderate region of decreased density in the left parietal-occipital lobe concerning for subacute infarct. There is additional questionable region of decreased density in the right temporal lobe. Given the questioned multifocal involvement, MRI of the brain, preferably with contrast, recommended for further evaluation to exclude the possibility of underlying mass lesions.    MRI not able to be done due to agitation  Dg Chest [facility] 04/27/2014    No acute cardiopulmonary abnormality seen.  2D echo 04/18/14   - Left ventricle: The cavity size was normal. Wall thickness was normal. Systolic function was normal. The estimated ejection fraction was in the range of  55% to 60%. - Left atrium: The atrium was mildly dilated.  Impressions: - Overall very poor image quality.  CUS - pending  Venous doppler - pending   PHYSICAL EXAM  Temp:  [97.5 F (36.4 C)-100.2 F (37.9 C)] 99.1 F (37.3 C) (01/02 0648) Pulse Rate:  [70-95] 79 (01/02 0648) Resp:  [18-22] 20 (01/02 0648) BP: (121-162)/(47-80) 127/71 mmHg (01/02 0648) SpO2:  [94 %-98 %] 95 % (01/02 0648) Weight:  [238 lb 1.6 oz (108.001 kg)] 238 lb 1.6 oz (108.001 kg) (01/02 0500)  General - Well nourished, well developed, restless in bed, global aphasia.  Ophthalmologic - not able to test due to agitation.  Cardiovascular - Regular rate and rhythm with no murmur.  Neuro - awake alert, but restless and agitated, global aphasia, does not follow simple commands, word salad, not able to name or repeat. No facial asymmetry. PERRL, EOMI, move all extremities spontaneously and equally. Reflex 1+ and no babinski  ASSESSMENT/PLAN [name] is a 68 y.o. female with history of atrial fibrillation, diabetes mellitus, recent sepsis, presenting with confusion and speech abnormalities . She did not receive IV t-PA due to unknown time of onset.  Stroke: Bilateral MCA embolic infarcts - etiology not clear but likely due to atrial fibrillation or  Endocarditis due to sepsis.  Resultant  confusion/global aphasia  MRI   (not yet performed secondary to agitation)   Carotid Doppler - 1-39 right ICA stenosis. Unable to image left side secondary to confusion and non compliance.   2D Echo EF 55-60%. Poor images. No cardiac source of emboli identified.  LE doppler - pending  LDL  39  HgbA1c  pending  Lovenox for VTE prophylaxis   Diet NPO time specified no liquids  aspirin 81 mg orally every day prior to admission, now on aspirin 300 mg suppository daily  Ongoing aggressive stroke risk factor management  Therapy recommendations:  Pending  Disposition:  Pending  AMS - Could be due to  hypernatremia - likely due to dehydration - need to correct slowly. Na 167 today. - IV dextrose 5% with 20 mEq KCl at 150 mL per hour - bilateral MCA stroke can cause AMS - seizure needs to be ruled out - EEG pending  Agitation - Haldol 1 mg IM every 8 hours prn - Dr. Nona Dell recommends Seroquel but currently no access.   Hx of Afib - on documentation - family stated that they have not being told about hx of Afib - if comfirmed, she need life long anticoagulation - continue tele - multiple runs of probable SVT in the 140-160 range - would probably benefit from low-dose beta blocker.  Sepsis - treated in [facility]  - no brain image at that time - need repeat blood culture - Leukocytosis - wbc's yesterday 21.1 K - TEE scheduled for Monday. Question if patient will  be able to cooperate.  Hypertension  Home meds:  Lopressor 50 mg twice a day  Stable  Hyperlipidemia  Home meds:  No lipid lowering medications prior to admission.  LDL pending, goal < 70  Continue statin at discharge  Diabetes  HgbA1c pending goal < 7.0  Controlled  Nutrition  Patient would not allow NG tube to be placed - may need to sedate patient to place NG tube  Family reports patient has not eaten in 3 weeks  Other Stroke Risk Factors  Advanced age  Obesity, Body mass index is 43.54 kg/(m^2).    Other Active Problems  Hypokalemia - 3.1 today  Elevated creatinine  Leukocytosis - blood cultures pending  Recheck labs in a.m.  Other Pertinent History  No regular medical follow-up for 17 years.  Hospital day # 2  Delton See [facility] Pager [phone] 04/29/2014, 8:26 AM  Confusion would consider seroquel use instead of haldol. Needs NG tube. Pauletta Browns    To contact Stroke Continuity provider, please refer to [email or web address]. After hours, contact General Neurology

## 2014-04-29 NOTE — Progress Notes (Signed)
VASCULAR LAB PRELIMINARY  PRELIMINARY  PRELIMINARY  PRELIMINARY  Bilateral lower extremity venous Dopplers completed.    Preliminary report:  There is no obvious evidence of DVT or SVT noted in the bilateral lower extremities.   Mable Dara, RVT 04/29/2014, 11:13 AM

## 2014-04-29 NOTE — Progress Notes (Signed)
Speech Language Pathology Treatment: Dysphagia  Patient Details Name: [name] MRN: [id number] DOB: 1946-01-21 Today's Date: 04/29/2014 Time: 1520-1600 SLP Time Calculation (min) (ACUTE ONLY): 40 min  Assessment / Plan / Recommendation Clinical Impression  Pt awake, restless. Mittens in place. Husband present. Petroleum Jelly applied to lips prior to oral care. SLP performed oral care with husband's assistance (holding pt's hands). Pt required max verbal, tactile and visual cues to allow limited oral care. She indicated that she would like to have some ice cream, so SLP retrieved ice cream (chocolate per husbands request), and proceeded to provide pt with 1/4tsp bolus followed by encouragement to allow SLP to use suction/swab to clean oral cavity. This was actually somewhat effective. Pt allowed SLP to place suction catheter toward the back of her mouth, where a small piece of dried, thick mucus was retrieved. Continued diligent oral care is of critical importance, and PO intake is not recommended until her oropharynx is significantly clearer than it is now. SLP able to visualize a significant amount of thick dry secretions along the posterior pharyngeal wall, which increases aspiration risk significantly, not only because of it's presence, but because pt has no sensation of it being there.  The amount of mucus in her throat is anticipated to be substantial, as her voice quality is altered because of it.  Ongoing, regular attempts to complete oral care is recommended, even if ice cream is used! Following all of this, pt allowed SLP to place a tiny amount of oral moisturizer on her tongue and encourage her to move it around in her mouth. Petroleum Jelly applied to lips again following oral care. . It was explained to pt and her husband that po trials will not be safe until further oral care is successful. ST to continue to follow pt for assessment and education.    HPI HPI: [name] is an 69 y.o.  female with a history of atrial fibrillation and recent hospitalization for sepsis, pneumonia rhabdomyolysis as well as hypernatremia and acute kidney injury, brought to the emergency room at [facility] for increased confusion and speech abnormality. Serum sodium was 169. CT scan of her head showed low density areas involving large area of left MCA territory as well as right temporal abnormality, with appearance indicative of probable subacute infarctions. MRI was recommended but could not be obtained because of patient's agitated state.   Pertinent Vitals Pain Assessment: Faces Faces Pain Scale: Hurts little more Pain Descriptors / Indicators: Grimacing;Moaning  SLP Plan  Continue with current plan of care;Other (Comment)    Recommendations Diet recommendations: NPO Medication Administration: Via alternative means              Oral Care Recommendations: Oral care Q4 per protocol Follow up Recommendations: Skilled Nursing facility Plan: Continue with current plan of care;Other (Comment)    GO   Justyce Baby B. Murvin [facility], CCC-SLP [phone]  Leigh Aurora 04/29/2014, 4:07 PM

## 2014-04-29 NOTE — Progress Notes (Signed)
CRITICAL VALUE ALERT  Critical value received:  Sodium 167 and Chloride >130  Date of notification:  04/29/2014   Time of notification: 6:03 AM  Critical value read back:yes   Nurse who received alert: Estanislado Emms  MD notified (1st page):  Burnadette Peter  Time of first page:  6:03 AM  MD notified (2nd page): Lynch   Time of second page: 6:19 AM  Responding MD:  Burnadette Peter   Time MD responded:  6: 38 AM

## 2014-04-29 NOTE — Progress Notes (Signed)
VASCULAR LAB PRELIMINARY  PRELIMINARY  PRELIMINARY  PRELIMINARY  Right Carotid Doppler completed.    Preliminary report:  1-39 right ICA stenosis.  Unable to image left side secondary to confusion and non compliance.   Jeannette Maddy, RVT 04/29/2014, 11:14 AM

## 2014-04-29 NOTE — Progress Notes (Signed)
[facility]                                                                             Chart reviewed.  Patient Demographics  [name], is a 69 y.o. female, DOB - April 05, 1946, ZOX:[id number]  Admit date - 04/27/2014   Admitting Physician Maryruth Bun Rama, MD  Outpatient Primary MD for the patient is No primary care provider on file.  LOS - 2   Chief Complaint  Patient presents with  . Altered Mental Status      HPI on 04/27/2014 by Dr. Trula Ore Rama [name] is an 69 y.o. female with a PMH of atrial fibillation, recently hospitalized 04/17/14-04/25/14 for treatment of sepsis, pneumonia, rhabdomyolysis, and demand ischemia from afib w/ RVR. She was also hypernatremic at discharge with a sodium of 154 and a creatinine of 1.74. She was discharged on lasix. According to notes, she was back to her normal baseline mental status at discharge. She had routine labs drawn today, for hospital follow up of electrolytes, and her sodium had increased to 169. Her creatinine is 1.66. Her mental status has deteriorated and she has been weak all day, according to family, who visited her today at her facility to have lunch with her. The patient is very confused and unable to provide any additional information. Denies pain, shortness of breath and cough. Denies nausea and vomiting. Keeps repeating "I need air" over and over again.  Assessment & Plan   Acute  Encephalopathy/Bilateral MCA territory Infarcts -CT head: moderate region of decreased density in left parietal-occipital lobe -MRI brain pending. Pt not cooperative enough to stay still -Neurology following -Speech, PT, OT consulted. Remains npo.   12/22 Echo poor quality, but EF normal.  TEE once able to get consent and cooperative enough. Hopefully, metabolic derangements will be corrected by Monday LDL 39 hgb a1c, Carotids pending Requiring mittens, posey and sitter currently, but able to answer some questions  appropriately  Hypernatremia/Hyperchloremia Still high. Increase D5. Did not get NT placed.  Hypokalemia Still low. Replete IV. Check mag  Weakness -Cannot assess at this time -per brother, patient has been weak and not eating properly for some time  intertrigo -Continue lamisil  Atrial fibrillation -Currently rate controlled -Continue metoprolol and aspirin  Diabetes mellitus, Type 2 -Continue lantus, ISS and CBG ok  Chronic kidney disease, stage III -Cr at baseline, continue to monitor BMP  Code Status: Full  Family Communication: none at bedside  Disposition Plan: Admitted  Time Spent in minutes   35 minutes  Procedures  None  Consults   Neurology Nephrology, via phone  DVT Prophylaxis  Lovenox  Lab Results  Component Value Date   PLT 177 04/28/2014    Medications  Scheduled Meds: . antiseptic oral rinse  7 mL Mouth Rinse BID  . enoxaparin (LOVENOX) injection  60 mg Subcutaneous Q24H  . free water  50 mL Per Tube 3 times per day  . Influenza vac split quadrivalent PF  0.5 mL Intramuscular Tomorrow-1000  . insulin aspart  0-9 Units Subcutaneous Q4H  . sodium chloride  3 mL Intravenous Q12H  . terbinafine   Topical BID   Continuous Infusions: . dextrose 5 %  1,000 mL with potassium chloride 20 mEq infusion 125 mL/hr at 04/29/14 0851   PRN Meds:.[DISCONTINUED] acetaminophen **OR** acetaminophen, [DISCONTINUED] ondansetron **OR** ondansetron (ZOFRAN) IV  Antibiotics    Anti-infectives    None      Subjective:   No complaints. Per RN, periods of agitation. uncooperative with mouth care  Objective:   Filed Vitals:   04/28/14 2044 04/29/14 0232 04/29/14 0500 04/29/14 0648  BP: 144/79 121/47  127/71  Pulse: 88 70  79  Temp: 99.6 F (37.6 C) 98.8 F (37.1 C)  99.1 F (37.3 C)  TempSrc: Axillary Axillary  Axillary  Resp: Height:      Weight:   108.001 kg (238 lb 1.6 oz)   SpO2: 94% 96%  95%    Wt Readings from Last 3  Encounters:  04/29/14 108.001 kg (238 lb 1.6 oz)  04/20/14 114.7 kg (252 lb 13.9 oz)    No intake or output data in the 24 hours ending 04/29/14 0900  Exam  General: obese. Alert. Knows name. Answers a few questions. Follows commands  HEENT: NCAT, extremely dry MM with insippated material   Cardiovascular: S1 S2 auscultated, irregular no MGR  Respiratory: Clear to auscultation bilaterally without WRR  Abdomen: Soft, nontender, nondistended, + bowel sounds. obese  Extremities: warm dry without cyanosis clubbing.  Trace edema LLE. mittens  Neuro: alert, cooperative. Speech slurred. Moving all 4 extremities purposefully. No obvious CN defecits  Data Review   Micro Results Recent Results (from the past 240 hour(s))  Clostridium Difficile by PCR     Status: None   Collection Time: 04/23/14  7:07 AM  Result Value Ref Range Status   C difficile by pcr NEGATIVE NEGATIVE Final    Comment: Performed at [facility]  MRSA PCR Screening     Status: None   Collection Time: 04/27/14  7:04 PM  Result Value Ref Range Status   MRSA by PCR NEGATIVE NEGATIVE Final    Comment:        The GeneXpert MRSA Assay (FDA approved for NASAL specimens only), is one component of a comprehensive MRSA colonization surveillance program. It is not intended to diagnose MRSA infection nor to guide or monitor treatment for MRSA infections.     Radiology Reports Ct Abdomen Pelvis Wo Contrast  04/18/2014   CLINICAL DATA:  Status post fall; found on floor. Sepsis, with progressive weakness for several weeks. Erythema at the groin and abdominal wall. Leukocytosis. Initial encounter.  EXAM: CT ABDOMEN AND PELVIS WITHOUT CONTRAST  TECHNIQUE: Multidetector CT imaging of the abdomen and pelvis was performed following the standard protocol without IV contrast.  COMPARISON:  Lumbar spine radiograph performed 04/17/2014  FINDINGS: Right lower lobe airspace opacification is compatible with pneumonia.  The  liver and spleen are unremarkable in appearance. Scattered stones are seen within the gallbladder. The gallbladder is otherwise unremarkable. The pancreas and adrenal glands are unremarkable.  Nonspecific perinephric stranding is noted bilaterally. Mild right-sided pelvicaliectasis remains within normal limits. There is no evidence of hydronephrosis. No renal or ureteral stones are seen.  No free fluid is identified. The small bowel is unremarkable in appearance. The stomach is within normal limits. No acute vascular abnormalities are seen. Minimal calcification is seen along the abdominal aorta and its branches.  The appendix is not definitely seen; there is no evidence for appendicitis. The colon is unremarkable in appearance.  A small-to-moderate umbilical hernia is noted, containing only fat.  The bladder is largely  decompressed and grossly unremarkable. A large 8.0 cm fibroid is suspected within the uterus. The uterus is mildly enlarged. The ovaries are grossly symmetric. No suspicious adnexal masses are seen. Stop No inguinal lymphadenopathy is seen.  No acute osseous abnormalities are identified.  IMPRESSION: 1. Right lower lobe pneumonia noted; this likely explains the patient's symptoms. 2. Cholelithiasis; gallbladder otherwise unremarkable. 3. Small to moderate umbilical hernia, containing only fat. 4. Mildly enlarged fibroid uterus noted.   Electronically Signed   By: Roanna Raider M.D.   On: 04/18/2014 05:07   Dg Chest 1 View  04/28/2014   CLINICAL DATA:  Fever. History of atrial fibrillation. Patient had a recent hospitalization for sepsis and pneumonia.  EXAM: CHEST - 1 VIEW  COMPARISON:  April 27, 2014  FINDINGS: The heart size and mediastinal contours are stable. The heart size is enlarged. There is mild diffuse increased pulmonary interstitium. There is no focal pneumonia or pleural effusion. The visualized skeletal structures are stable.  IMPRESSION: Mild congestive heart failure.    Electronically Signed   By: Sherian Rein M.D.   On: 04/28/2014 08:38   Dg Chest 2 View  04/17/2014   CLINICAL DATA:  Pt arrived via EMS with report of falling without injury d/t "legs given out" on concrete garage floor since 1300 and was found by spouse ast 1845. EMS reported that CBG was greater than 600, poor appetite, noncompliant with diet d/t noted soft drinks and sweeten tea in house, strong urine smell, and red rash to abd folds. Pt c/o lower back pain, large bruising to left hip and groin, denies any chest complaints, best obtainable images due to pt size and condition  EXAM: CHEST  2 VIEW  COMPARISON:  None.  FINDINGS: There are low lung volumes with patient rotation to the right. The lateral view is limited. There is elevation of the right hemidiaphragm. The aorta appears ectatic and somewhat unfolded. The heart size is normal. Mild atelectasis is present at the right lung base. There is no consolidation, significant pleural effusion or pneumothorax. No acute osseous findings identified.  IMPRESSION: Elevated right hemidiaphragm with associated right basilar atelectasis. No consolidation or significant edema.   Electronically Signed   By: Roxy Horseman M.D.   On: 04/17/2014 21:36   Dg Lumbar Spine 2-3 Views  04/17/2014   CLINICAL DATA:  Fall.  Weakness.  Initial evaluation.  EXAM: LUMBAR SPINE - 2-3 VIEW  COMPARISON:  None.  FINDINGS: Five lumbar type vertebral bodies are present. There is mild levoscoliosis. Vertebral bodies are otherwise normally aligned with preservation of the normal lumbar lordosis.  There is anterior wedging of the L1 and L2 vertebral bodies, favored to be chronic in nature. Vertebral body heights are otherwise preserved. No definite acute fracture listhesis.  No soft tissue abnormality.  Moderate multilevel degenerative disc disease present within the visualized spine.  IMPRESSION: 1. No definite acute traumatic injury within the lumbar spine. 2. Anterior wedging of the L1  and L2 vertebral bodies, favored to be chronic in nature. Possible acute compression fractures are not entirely excluded. Correlation with site of pain recommended.   Electronically Signed   By: Rise Mu M.D.   On: 04/17/2014 21:37   Dg Pelvis 1-2 Views  04/17/2014   CLINICAL DATA:  Patient fell on concrete floor earlier in the day  EXAM: PELVIS - 1-2 VIEW  COMPARISON:  None.  FINDINGS: There is no evidence of pelvic fracture or dislocation. 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 No erosive change.  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 No fracture or dislocation.   Electronically Signed   By: Bretta Bang M.D.   On: 04/17/2014 21:37   Dg Tibia/fibula Left  04/17/2014   CLINICAL DATA:  Status post fall; acute onset of left lower leg pain. Initial encounter.  EXAM: LEFT TIBIA AND FIBULA - 2 VIEW  COMPARISON:  None.  FINDINGS: There is no evidence of fracture or dislocation. The tibia and fibula appear intact.  Marginal osteophytes are seen arising at the medial and lateral compartments of the knee, with wall osteophytes also seen. No knee joint effusion is identified.  The ankle mortise is incompletely assessed, but appears grossly unremarkable. A plantar calcaneal spur is incidentally noted. No significant soft tissue abnormalities are identified.  IMPRESSION: No evidence of fracture or dislocation.   Electronically Signed   By: Roanna Raider M.D.   On: 04/17/2014 22:52   Ct Head Wo Contrast  04/27/2014   CLINICAL DATA:  Altered mental status.  EXAM: CT HEAD WITHOUT CONTRAST  TECHNIQUE: Contiguous axial images were obtained from the base of the skull through the vertex without intravenous contrast.  COMPARISON:  None.  FINDINGS: There is ill-defined decreased density involving the moderate portion of the left posterior parietal occipital lobes  extending from the lateral ventricle of the cortex. There is an additional questionable region of decreased density in the right temporal lobe versus volume averaging. There is no associated hemorrhage. There is no midline shift. There is mild prominence of the extra-axial CSF space on the right, may be related to atrophy versus subdural hygroma. The calvarium is intact. Included paranasal sinuses and mastoid air cells are well aerated.  IMPRESSION: Moderate region of decreased density in the left parietal-occipital lobe concerning for subacute infarct. There is additional questionable region of decreased density in the right temporal lobe. Given the questioned multifocal involvement, MRI of the brain, preferably with contrast, recommended for further evaluation to exclude the possibility of underlying mass lesions.  These results were called by telephone at the time of interpretation on 04/27/2014 at 3:28 pm to Dr. Pricilla Loveless , who verbally acknowledged these results.   Electronically Signed   By: Rubye Oaks M.D.   On: 04/27/2014 15:31   Dg Chest Port 1 View  04/27/2014   CLINICAL DATA:  Altered mental status.  EXAM: PORTABLE CHEST - 1 VIEW  COMPARISON:  April 17, 2014.  FINDINGS: The heart size and mediastinal contours are within normal limits. Both lungs are clear. No pneumothorax or pleural effusion is noted. The visualized skeletal structures are unremarkable.  IMPRESSION: No acute cardiopulmonary abnormality seen.   Electronically Signed   By: Roque Lias M.D.   On: 04/27/2014 14:43    CBC  Recent Labs Lab 04/24/14 0404 04/27/14 1504 04/28/14 0634  WBC 13.7* 18.8* 21.1*  HGB 12.0 12.2 12.3  HCT 37.2 40.0 37.6  PLT 201 181 177  MCV 91.4 95.9 95.2  MCH 29.5 29.3 31.1  MCHC 32.3 30.5 32.7  RDW 15.2 16.7* 16.9*  LYMPHSABS  --  1.5  --   MONOABS  --  0.8  --   EOSABS  --  0.1  --   BASOSABS  --  0.0  --     Chemistries   Recent Labs Lab 04/25/14 0715 04/27/14 1425  04/27/14 1504 04/28/14 4098  04/28/14 1832 04/29/14 0350  NA 154* 169* 166* 168* 169* 167*  K 3.8  --  3.2* 3.5 3.0* 3.1*  CL 122*  --  128* >130* >130* >130*  CO2 27  --  29 26 27 26   GLUCOSE 104*  --  102* 89 119* 145*  BUN 16  --  17 16 16 17   CREATININE 1.74* 1.7* 1.66* 1.84* 1.95* 1.96*  CALCIUM 8.1*  --  8.7 8.7 8.6 8.1*  AST  --   --  33  --   --   --   ALT  --   --  17  --   --   --   ALKPHOS  --   --  315*  --   --   --   BILITOT  --   --  0.7  --   --   --     Recent Labs  04/28/14 0932 04/28/14 1126 04/28/14 1702 04/28/14 2043 04/29/14 0035 04/29/14 0441  GLUCAP 104* 100* 104* 112* 129* 142*    ------------------------------------------------------------------------------------------------------------------ estimated creatinine clearance is 31.3 mL/min (by C-G formula based on Cr of 1.96). ------------------------------------------------------------------------------------------------------------------ No results for input(s): HGBA1C in the last 72 hours. ------------------------------------------------------------------------------------------------------------------  Recent Labs  04/29/14 0350  CHOL 101  HDL 26*  LDLCALC 39  TRIG 161*  CHOLHDL 3.9   ------------------------------------------------------------------------------------------------------------------ No results for input(s): TSH, T4TOTAL, T3FREE, THYROIDAB in the last 72 hours.  Invalid input(s): FREET3 ------------------------------------------------------------------------------------------------------------------ No results for input(s): VITAMINB12, FOLATE, FERRITIN, TIBC, IRON, RETICCTPCT in the last 72 hours.  Coagulation profile No results for input(s): INR, PROTIME in the last 168 hours.  No results for input(s): DDIMER in the last 72 hours.  Cardiac Enzymes  Recent Labs Lab 04/27/14 1504  TROPONINI 0.06*    ------------------------------------------------------------------------------------------------------------------ Invalid input(s): POCBNP    Tanijah Morais L D.O. on 04/29/2014 at 9:00 AM  Between 7am to 7pm - Pager - [phone]  After 7pm go to [email or web address] - password [email or web address]  And look for the night coverage person covering for me after hours  [facility] Office  [phone]

## 2014-04-30 LAB — URINALYSIS, ROUTINE W REFLEX MICROSCOPIC
BILIRUBIN URINE: NEGATIVE
Glucose, UA: NEGATIVE mg/dL
Ketones, ur: 15 mg/dL — AB
NITRITE: POSITIVE — AB
Protein, ur: 100 mg/dL — AB
Specific Gravity, Urine: 1.011 (ref 1.005–1.030)
Urobilinogen, UA: 1 mg/dL (ref 0.0–1.0)
pH: 5.5 (ref 5.0–8.0)

## 2014-04-30 LAB — CBC WITH DIFFERENTIAL/PLATELET
Basophils Absolute: 0 10*3/uL (ref 0.0–0.1)
Basophils Relative: 0 % (ref 0–1)
EOS ABS: 0.2 10*3/uL (ref 0.0–0.7)
Eosinophils Relative: 1 % (ref 0–5)
HCT: 36.8 % (ref 36.0–46.0)
Hemoglobin: 11.6 g/dL — ABNORMAL LOW (ref 12.0–15.0)
LYMPHS PCT: 14 % (ref 12–46)
Lymphs Abs: 2.4 10*3/uL (ref 0.7–4.0)
MCH: 30.4 pg (ref 26.0–34.0)
MCHC: 31.5 g/dL (ref 30.0–36.0)
MCV: 96.6 fL (ref 78.0–100.0)
Monocytes Absolute: 0.9 10*3/uL (ref 0.1–1.0)
Monocytes Relative: 5 % (ref 3–12)
NEUTROS PCT: 80 % — AB (ref 43–77)
Neutro Abs: 13.3 10*3/uL — ABNORMAL HIGH (ref 1.7–7.7)
Platelets: 152 10*3/uL (ref 150–400)
RBC: 3.81 MIL/uL — AB (ref 3.87–5.11)
RDW: 16.8 % — ABNORMAL HIGH (ref 11.5–15.5)
WBC: 16.8 10*3/uL — ABNORMAL HIGH (ref 4.0–10.5)

## 2014-04-30 LAB — BASIC METABOLIC PANEL
Anion gap: 9 (ref 5–15)
BUN: 17 mg/dL (ref 6–23)
CO2: 27 mmol/L (ref 19–32)
CREATININE: 1.91 mg/dL — AB (ref 0.50–1.10)
Calcium: 8.2 mg/dL — ABNORMAL LOW (ref 8.4–10.5)
Chloride: 127 mEq/L — ABNORMAL HIGH (ref 96–112)
GFR calc Af Amer: 30 mL/min — ABNORMAL LOW (ref >90)
GFR calc non Af Amer: 26 mL/min — ABNORMAL LOW (ref >90)
Glucose, Bld: 173 mg/dL — ABNORMAL HIGH (ref 70–99)
Potassium: 3.2 mmol/L — ABNORMAL LOW (ref 3.5–5.1)
Sodium: 163 mmol/L (ref 135–145)

## 2014-04-30 LAB — GLUCOSE, CAPILLARY
GLUCOSE-CAPILLARY: 131 mg/dL — AB (ref 70–99)
Glucose-Capillary: 134 mg/dL — ABNORMAL HIGH (ref 70–99)
Glucose-Capillary: 136 mg/dL — ABNORMAL HIGH (ref 70–99)
Glucose-Capillary: 143 mg/dL — ABNORMAL HIGH (ref 70–99)
Glucose-Capillary: 147 mg/dL — ABNORMAL HIGH (ref 70–99)
Glucose-Capillary: 150 mg/dL — ABNORMAL HIGH (ref 70–99)

## 2014-04-30 LAB — HEMOGLOBIN A1C
Hgb A1c MFr Bld: 12.1 % — ABNORMAL HIGH (ref <5.7)
Mean Plasma Glucose: 301 mg/dL — ABNORMAL HIGH (ref <117)

## 2014-04-30 LAB — URINE MICROSCOPIC-ADD ON

## 2014-04-30 MED ORDER — HALOPERIDOL LACTATE 5 MG/ML IJ SOLN: 2.0000 mg | Freq: Four times a day (QID) | INTRAMUSCULAR | Status: DC | PRN

## 2014-04-30 MED ORDER — HALOPERIDOL LACTATE 5 MG/ML IJ SOLN
2.0000 mg | Freq: Once | INTRAMUSCULAR | Status: AC
  Administered 2014-04-30: 2 mg via INTRAMUSCULAR

## 2014-04-30 MED ORDER — SODIUM CHLORIDE 0.9 % IJ SOLN
10.0000 mL | INTRAMUSCULAR | Status: DC | PRN
  Administered 2014-05-01 – 2014-05-02 (×2): 10 mL
  Filled 2014-04-30 (×2): qty 40

## 2014-04-30 NOTE — Progress Notes (Signed)
[facility]                                                                              Patient Demographics  [name], is a 69 y.o. female, DOB - [birth year], WNU:[id number]  Admit date - 04/27/2014   Admitting Physician Maryruth Bun Rama, MD  Outpatient Primary MD for the patient is No primary care provider on file.  LOS - 3   Chief Complaint  Patient presents with  . Altered Mental Status      HPI on 04/27/2014 by Dr. Trula Ore Rama [name] is an 68 y.o. female with a PMH of atrial fibillation, recently hospitalized 04/17/14-04/25/14 for treatment of sepsis, pneumonia, rhabdomyolysis, and demand ischemia from afib w/ RVR. She was also hypernatremic at discharge with a sodium of 154 and a creatinine of 1.74. She was discharged on lasix. According to notes, she was back to her normal baseline mental status at discharge. She had routine labs drawn today, for hospital follow up of electrolytes, and her sodium had increased to 169. Her creatinine is 1.66. Her mental status has deteriorated and she has been weak all day, according to family, who visited her today at her facility to have lunch with her. The patient is very confused and unable to provide any additional information. Denies pain, shortness of breath and cough. Denies nausea and vomiting. Keeps repeating "I need air" over and over again.  Assessment & Plan   Acute  Encephalopathy/?Bilateral MCA territory Infarcts -CT head: moderate region of decreased density in left parietal-occipital lobe -MRI brain pending -Neurology consulted and appreciated -Echocardiogram from 12/22: Normal EF -Speech, PT, OT consulted -Speech recommended NPO and alternate means of nutrition, however, unable to place NGtube -Neurology recommended TEE -Cardiology consulted -Right carotid doppler: 1-39% ICA stenosis, unable to image left due to patient noncompliance -Lower ext doppler: negative for DVT or SVT -Blood cultures  pending -UA: shows 7-10 WBC, small leukocytes -Will obtain urine culture  Hypernatremia/Hyperchloremia -Likely secondary to dehydration and poor oral intake complicated by diuretics -Lasix held -Continue IVF, continue  D5W (Spoke with nephrology via phone) -Continue to monitor BMP  Hypokalemia -Secondary to diurectics -Will replace and continue to monitor -Magnesium 1.8  Weakness -Cannot assess at this time -per brother, patient has been weak and not eating properly for some time  Intertigo Dermatitis -Continue lamisil  Atrial fibrillation -Currently rate controlled -Continue metoprolol and aspirin  Diabetes mellitus, Type 2 -Continue lantus, ISS and CBG monitoring  Chronic kidney disease, stage III -Cr at baseline, continue to monitor BMP  Code Status: Full  Family Communication: None at bedside  Disposition Plan: Admitted  Time Spent in minutes   30 minutes  Procedures  None  Consults   Neurology Nephrology, via phone  DVT Prophylaxis  Lovenox  Lab Results  Component Value Date   PLT 152 04/30/2014    Medications  Scheduled Meds: . antiseptic oral rinse  7 mL Mouth Rinse BID  . enoxaparin (LOVENOX) injection  50 mg Subcutaneous Q24H  . Influenza vac split quadrivalent PF  0.5 mL Intramuscular Tomorrow-1000  . insulin aspart  0-9 Units Subcutaneous Q4H  . QUEtiapine  25 mg Oral QHS  .  sodium chloride  3 mL Intravenous Q12H  . terbinafine   Topical BID   Continuous Infusions: . dextrose 5 % 1,000 mL with potassium chloride 20 mEq infusion 150 mL/hr at 04/30/14 0804   PRN Meds:.[DISCONTINUED] acetaminophen **OR** acetaminophen, haloperidol lactate, [DISCONTINUED] ondansetron **OR** ondansetron (ZOFRAN) IV  Antibiotics    Anti-infectives    None      Subjective:   [name] seen and examined today.  Patient continues to be confused.  Pulled out IV overnight.  Will not allow for NGtube insertion.  She is able to follow commands.     Objective:   Filed Vitals:   04/29/14 1727 04/29/14 2118 04/30/14 0149 04/30/14 0700  BP: 103/49 112/81 123/77 125/62  Pulse: 81 88 89   Temp: 98.9 F (37.2 C) 98.7 F (37.1 C) 97.7 F (36.5 C) 99.1 F (37.3 C)  TempSrc: Axillary Axillary Axillary Axillary  Resp: 20 20 24 26   Height:      Weight:    108.909 kg (240 lb 1.6 oz)  SpO2: 97% 96% 95%     Wt Readings from Last 3 Encounters:  04/30/14 108.909 kg (240 lb 1.6 oz)  04/20/14 114.7 kg (252 lb 13.9 oz)    No intake or output data in the 24 hours ending 04/30/14 1034  Exam  General: Well developed, well nourished, NAD, appears stated age  HEENT: NCAT, mucous membranes moist.   Cardiovascular: S1 S2 auscultated, irregular  Respiratory: Clear to auscultation bilaterally with equal chest rise  Abdomen: Soft, obese, nontender, nondistended, + bowel sounds  Extremities: warm dry without cyanosis clubbing.  Trace edema LLE. Mittens  Neuro: AAOx1, follows some commands, moves all extremities   Data Review   Micro Results Recent Results (from the past 240 hour(s))  Clostridium Difficile by PCR     Status: None   Collection Time: 04/23/14  7:07 AM  Result Value Ref Range Status   C difficile by pcr NEGATIVE NEGATIVE Final    Comment: Performed at [facility]  MRSA PCR Screening     Status: None   Collection Time: 04/27/14  7:04 PM  Result Value Ref Range Status   MRSA by PCR NEGATIVE NEGATIVE Final    Comment:        The GeneXpert MRSA Assay (FDA approved for NASAL specimens only), is one component of a comprehensive MRSA colonization surveillance program. It is not intended to diagnose MRSA infection nor to guide or monitor treatment for MRSA infections.     Radiology Reports Ct Abdomen Pelvis Wo Contrast  04/18/2014   CLINICAL DATA:  Status post fall; found on floor. Sepsis, with progressive weakness for several weeks. Erythema at the groin and abdominal wall. Leukocytosis. Initial  encounter.  EXAM: CT ABDOMEN AND PELVIS WITHOUT CONTRAST  TECHNIQUE: Multidetector CT imaging of the abdomen and pelvis was performed following the standard protocol without IV contrast.  COMPARISON:  Lumbar spine radiograph performed 04/17/2014  FINDINGS: Right lower lobe airspace opacification is compatible with pneumonia.  The liver and spleen are unremarkable in appearance. Scattered stones are seen within the gallbladder. The gallbladder is otherwise unremarkable. The pancreas and adrenal glands are unremarkable.  Nonspecific perinephric stranding is noted bilaterally. Mild right-sided pelvicaliectasis remains within normal limits. There is no evidence of hydronephrosis. No renal or ureteral stones are seen.  No free fluid is identified. The small bowel is unremarkable in appearance. The stomach is within normal limits. No acute vascular abnormalities are seen. Minimal calcification is seen along the  abdominal aorta and its branches.  The appendix is not definitely seen; there is no evidence for appendicitis. The colon is unremarkable in appearance.  A small-to-moderate umbilical hernia is noted, containing only fat.  The bladder is largely decompressed and grossly unremarkable. A large 8.0 cm fibroid is suspected within the uterus. The uterus is mildly enlarged. The ovaries are grossly symmetric. No suspicious adnexal masses are seen. Stop No inguinal lymphadenopathy is seen.  No acute osseous abnormalities are identified.  IMPRESSION: 1. Right lower lobe pneumonia noted; this likely explains the patient's symptoms. 2. Cholelithiasis; gallbladder otherwise unremarkable. 3. Small to moderate umbilical hernia, containing only fat. 4. Mildly enlarged fibroid uterus noted.   Electronically Signed   By: Roanna Raider M.D.   On: 04/18/2014 05:07   Dg Chest 1 View  04/28/2014   CLINICAL DATA:  Fever. History of atrial fibrillation. Patient had a recent hospitalization for sepsis and pneumonia.  EXAM: CHEST - 1  VIEW  COMPARISON:  April 27, 2014  FINDINGS: The heart size and mediastinal contours are stable. The heart size is enlarged. There is mild diffuse increased pulmonary interstitium. There is no focal pneumonia or pleural effusion. The visualized skeletal structures are stable.  IMPRESSION: Mild congestive heart failure.   Electronically Signed   By: Sherian Rein M.D.   On: 04/28/2014 08:38   Dg Chest 2 View  04/17/2014   CLINICAL DATA:  Pt arrived via EMS with report of falling without injury d/t "legs given out" on concrete garage floor since 1300 and was found by spouse ast 1845. EMS reported that CBG was greater than 600, poor appetite, noncompliant with diet d/t noted soft drinks and sweeten tea in house, strong urine smell, and red rash to abd folds. Pt c/o lower back pain, large bruising to left hip and groin, denies any chest complaints, best obtainable images due to pt size and condition  EXAM: CHEST  2 VIEW  COMPARISON:  None.  FINDINGS: There are low lung volumes with patient rotation to the right. The lateral view is limited. There is elevation of the right hemidiaphragm. The aorta appears ectatic and somewhat unfolded. The heart size is normal. Mild atelectasis is present at the right lung base. There is no consolidation, significant pleural effusion or pneumothorax. No acute osseous findings identified.  IMPRESSION: Elevated right hemidiaphragm with associated right basilar atelectasis. No consolidation or significant edema.   Electronically Signed   By: Roxy Horseman M.D.   On: 04/17/2014 21:36   Dg Lumbar Spine 2-3 Views  04/17/2014   CLINICAL DATA:  Fall.  Weakness.  Initial evaluation.  EXAM: LUMBAR SPINE - 2-3 VIEW  COMPARISON:  None.  FINDINGS: Five lumbar type vertebral bodies are present. There is mild levoscoliosis. Vertebral bodies are otherwise normally aligned with preservation of the normal lumbar lordosis.  There is anterior wedging of the L1 and L2 vertebral bodies, favored to  be chronic in nature. Vertebral body heights are otherwise preserved. No definite acute fracture listhesis.  No soft tissue abnormality.  Moderate multilevel degenerative disc disease present within the visualized spine.  IMPRESSION: 1. No definite acute traumatic injury within the lumbar spine. 2. Anterior wedging of the L1 and L2 vertebral bodies, favored to be chronic in nature. Possible acute compression fractures are not entirely excluded. Correlation with site of pain recommended.   Electronically Signed   By: Rise Mu M.D.   On: 04/17/2014 21:37   Dg Pelvis 1-2 Views  04/17/2014   CLINICAL DATA:  Patient fell on concrete floor earlier in the day  EXAM: PELVIS - 1-2 VIEW  COMPARISON:  None.  FINDINGS: There is no evidence of pelvic fracture or dislocation. 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 No erosive change.  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 No fracture or dislocation.   Electronically Signed   By: Bretta Bang M.D.   On: 04/17/2014 21:37   Dg Tibia/fibula Left  04/17/2014   CLINICAL DATA:  Status post fall; acute onset of left lower leg pain. Initial encounter.  EXAM: LEFT TIBIA AND FIBULA - 2 VIEW  COMPARISON:  None.  FINDINGS: There is no evidence of fracture or dislocation. The tibia and fibula appear intact.  Marginal osteophytes are seen arising at the medial and lateral compartments of the knee, with wall osteophytes also seen. No knee joint effusion is identified.  The ankle mortise is incompletely assessed, but appears grossly unremarkable. A plantar calcaneal spur is incidentally noted. No significant soft tissue abnormalities are identified.  IMPRESSION: No evidence of fracture or dislocation.   Electronically Signed   By: Roanna Raider M.D.   On: 04/17/2014 22:52   Ct Head Wo Contrast  04/27/2014   CLINICAL DATA:   Altered mental status.  EXAM: CT HEAD WITHOUT CONTRAST  TECHNIQUE: Contiguous axial images were obtained from the base of the skull through the vertex without intravenous contrast.  COMPARISON:  None.  FINDINGS: There is ill-defined decreased density involving the moderate portion of the left posterior parietal occipital lobes extending from the lateral ventricle of the cortex. There is an additional questionable region of decreased density in the right temporal lobe versus volume averaging. There is no associated hemorrhage. There is no midline shift. There is mild prominence of the extra-axial CSF space on the right, may be related to atrophy versus subdural hygroma. The calvarium is intact. Included paranasal sinuses and mastoid air cells are well aerated.  IMPRESSION: Moderate region of decreased density in the left parietal-occipital lobe concerning for subacute infarct. There is additional questionable region of decreased density in the right temporal lobe. Given the questioned multifocal involvement, MRI of the brain, preferably with contrast, recommended for further evaluation to exclude the possibility of underlying mass lesions.  These results were called by telephone at the time of interpretation on 04/27/2014 at 3:28 pm to Dr. Pricilla Loveless , who verbally acknowledged these results.   Electronically Signed   By: Rubye Oaks M.D.   On: 04/27/2014 15:31   Dg Chest Port 1 View  04/27/2014   CLINICAL DATA:  Altered mental status.  EXAM: PORTABLE CHEST - 1 VIEW  COMPARISON:  April 17, 2014.  FINDINGS: The heart size and mediastinal contours are within normal limits. Both lungs are clear. No pneumothorax or pleural effusion is noted. The visualized skeletal structures are unremarkable.  IMPRESSION: No acute cardiopulmonary abnormality seen.   Electronically Signed   By: Roque Lias M.D.   On: 04/27/2014 14:43    CBC  Recent Labs Lab 04/24/14 0404 04/27/14 1504 04/28/14 0634  04/30/14 0429  WBC 13.7* 18.8* 21.1* 16.8*  HGB 12.0 12.2 12.3 11.6*  HCT 37.2 40.0 37.6 36.8  PLT 201 181 177 152  MCV 91.4 95.9 95.2 96.6  MCH 29.5 29.3 31.1 30.4  MCHC 32.3 30.5 32.7 31.5  RDW 15.2 16.7* 16.9* 16.8*  LYMPHSABS  --  1.5  --  2.4  MONOABS  --  0.8  --  0.9  EOSABS  --  0.1  --  0.2  BASOSABS  --  0.0  --  0.0    Chemistries   Recent Labs Lab 04/27/14 1504 04/28/14 0634 04/28/14 1832 04/29/14 0350 04/30/14 0429  NA 166* 168* 169* 167* 163*  K 3.2* 3.5 3.0* 3.1* 3.2*  CL 128* >130* >130* >130* 127*  CO2 GLUCOSE 102* 89 119* 145* 173*  BUN CREATININE 1.66* 1.84* 1.95* 1.96* 1.91*  CALCIUM 8.7 8.7 8.6 8.1* 8.2*  MG  --   --   --  1.8  --   AST 33  --   --   --   --   ALT 17  --   --   --   --   ALKPHOS 315*  --   --   --   --   BILITOT 0.7  --   --   --   --    ------------------------------------------------------------------------------------------------------------------ estimated creatinine clearance is 32.3 mL/min (by C-G formula based on Cr of 1.91). ------------------------------------------------------------------------------------------------------------------ No results for input(s): HGBA1C in the last 72 hours. ------------------------------------------------------------------------------------------------------------------  Recent Labs  04/29/14 0350  CHOL 101  HDL 26*  LDLCALC 39  TRIG 161*  CHOLHDL 3.9   ------------------------------------------------------------------------------------------------------------------ No results for input(s): TSH, T4TOTAL, T3FREE, THYROIDAB in the last 72 hours.  Invalid input(s): FREET3 ------------------------------------------------------------------------------------------------------------------ No results for input(s): VITAMINB12, FOLATE, FERRITIN, TIBC, IRON, RETICCTPCT in the last 72 hours.  Coagulation profile No results for input(s): INR, PROTIME in the  last 168 hours.  No results for input(s): DDIMER in the last 72 hours.  Cardiac Enzymes  Recent Labs Lab 04/27/14 1504  TROPONINI 0.06*   ------------------------------------------------------------------------------------------------------------------ Invalid input(s): POCBNP    Momo Braun D.O. on 04/30/2014 at 10:34 AM  Between 7am to 7pm - Pager - [phone]  After 7pm go to [email or web address] - password [email or web address]  And look for the night coverage person covering for me after hours  [facility] Office  [phone]

## 2014-04-30 NOTE — Progress Notes (Signed)
STROKE TEAM PROGRESS NOTE   HISTORY [name] is a 69 y.o. female with a history of atrial fibrillation and recent hospitalization for sepsis, pneumonia rhabdomyolysis as well as hypernatremia and acute kidney injury, brought to the emergency room at [facility] for increased confusion and speech abnormality. Serum sodium was 169. CT scan of her head showed low density areas involving large area of left MCA territory as well as right temporal abnormality, with appearance indicative of probable subacute infarctions. MRI was recommended but could not be obtained because of patient's agitated state. She was transferred here for further evaluation and management with stroke service intervention.  LSN: Unclear tPA Given: No: CT abnormalities is an on presentation, as well as unclear when last known well. mRankin:  SUBJECTIVE (INTERVAL HISTORY) The patient's brother and another family member are present today. The patient's condition was discussed and questions were answered.  OBJECTIVE Temp:  [97.7 F (36.5 C)-99.1 F (37.3 C)] 99.1 F (37.3 C) (01/03 0700) Pulse Rate:  [81-89] 89 (01/03 0149) Cardiac Rhythm:  [-] Atrial fibrillation;Sinus tachycardia (01/02 2002) Resp:  [20-26] 26 (01/03 0700) BP: (103-128)/(49-99) 125/62 mmHg (01/03 0700) SpO2:  [95 %-97 %] 95 % (01/03 0149) Weight:  [240 lb 1.6 oz (108.909 kg)] 240 lb 1.6 oz (108.909 kg) (01/03 0700)   Recent Labs Lab 04/29/14 1628 04/29/14 2100 04/29/14 2356 04/30/14 0407 04/30/14 0803  GLUCAP 117* 130* 147* 134* 136*    Recent Labs Lab 04/27/14 1504 04/28/14 0634 04/28/14 1832 04/29/14 0350 04/30/14 0429  NA 166* 168* 169* 167* 163*  K 3.2* 3.5 3.0* 3.1* 3.2*  CL 128* >130* >130* >130* 127*  CO2 29 26 27 26 27   GLUCOSE 102* 89 119* 145* 173*  BUN 17 16 16 17 17   CREATININE 1.66* 1.84* 1.95* 1.96* 1.91*  CALCIUM 8.7 8.7 8.6 8.1* 8.2*  MG  --   --   --  1.8  --     Recent Labs Lab 04/27/14 1504  AST  33  ALT 17  ALKPHOS 315*  BILITOT 0.7  PROT 6.9  ALBUMIN 2.5*    Recent Labs Lab 04/24/14 0404 04/27/14 1504 04/28/14 0634 04/30/14 0429  WBC 13.7* 18.8* 21.1* 16.8*  NEUTROABS  --  16.3*  --  13.3*  HGB 12.0 12.2 12.3 11.6*  HCT 37.2 40.0 37.6 36.8  MCV 91.4 95.9 95.2 96.6  PLT 201 181 177 152    Recent Labs Lab 04/27/14 1504  CKTOTAL 43  TROPONINI 0.06*   No results for input(s): LABPROT, INR in the last 72 hours.  Recent Labs  04/27/14 1728  COLORURINE YELLOW  LABSPEC 1.009  PHURINE 5.0  GLUCOSEU NEGATIVE  HGBUR TRACE*  BILIRUBINUR NEGATIVE  KETONESUR NEGATIVE  PROTEINUR NEGATIVE  UROBILINOGEN 0.2  NITRITE NEGATIVE  LEUKOCYTESUR SMALL*       Component Value Date/Time   CHOL 101 04/29/2014 0350   TRIG 182* 04/29/2014 0350   HDL 26* 04/29/2014 0350   CHOLHDL 3.9 04/29/2014 0350   VLDL 36 04/29/2014 0350   LDLCALC 39 04/29/2014 0350   No results found for: HGBA1C    Component Value Date/Time   LABOPIA POSITIVE* 04/18/2014 1754   COCAINSCRNUR NONE DETECTED 04/18/2014 1754   LABBENZ NONE DETECTED 04/18/2014 1754   AMPHETMU NONE DETECTED 04/18/2014 1754   THCU NONE DETECTED 04/18/2014 1754   LABBARB NONE DETECTED 04/18/2014 1754    No results for input(s): ETH in the last 168 hours.  Ct Head Wo Contrast 04/27/2014  Moderate region of decreased density in the left parietal-occipital lobe concerning for subacute infarct. There is additional questionable region of decreased density in the right temporal lobe. Given the questioned multifocal involvement, MRI of the brain, preferably with contrast, recommended for further evaluation to exclude the possibility of underlying mass lesions.    MRI not able to be done due to agitation  Dg Chest [facility] 04/27/2014    No acute cardiopulmonary abnormality seen.  DG Chest 1 View 04/28/2014 Mild congestive heart failure  2D echo 04/18/14   - Left ventricle: The cavity size was normal. Wall  thickness was normal. Systolic function was normal. The estimated ejection fraction was in the range of 55% to 60%. - Left atrium: The atrium was mildly dilated.  Impressions: - Overall very poor image quality.  CUS -  1-39 right ICA stenosis. Unable to image left side secondary to confusion and non compliance.   Venous doppler - There is no obvious evidence of DVT or SVT noted in the bilateral lower extremities.    PHYSICAL EXAM  Temp:  [97.7 F (36.5 C)-99.1 F (37.3 C)] 99.1 F (37.3 C) (01/03 0700) Pulse Rate:  [81-89] 89 (01/03 0149) Resp:  [20-26] 26 (01/03 0700) BP: (103-128)/(49-99) 125/62 mmHg (01/03 0700) SpO2:  [95 %-97 %] 95 % (01/03 0149) Weight:  [240 lb 1.6 oz (108.909 kg)] 240 lb 1.6 oz (108.909 kg) (01/03 0700)  General - Well nourished, well developed, restless in bed, global aphasia.  Ophthalmologic - not able to test due to agitation.  Cardiovascular - Regular rate and rhythm with no murmur.  Neuro - awake alert, but restless and agitated, global aphasia, does not follow simple commands, word salad, not able to name or repeat. No facial asymmetry. PERRL, EOMI, move all extremities spontaneously and equally. Reflex 1+ and no babinski  ASSESSMENT/PLAN [name] is a 69 y.o. female with history of atrial fibrillation, diabetes mellitus, recent sepsis, presenting with confusion and speech abnormalities . She did not receive IV t-PA due to unknown time of onset.  Stroke: Bilateral MCA embolic infarcts - etiology not clear but likely due to atrial fibrillation or  Endocarditis due to sepsis.  Resultant  confusion/global aphasia  MRI   (not yet performed secondary to agitation)   Carotid Doppler - 1-39 right ICA stenosis. Unable to image left side secondary to confusion and non compliance.   2D Echo EF 55-60%. Poor images. No cardiac source of emboli identified.  LE doppler - negative for DVT  LDL  39  HgbA1c  12.1  Lovenox for VTE  prophylaxis   Diet NPO time specified no liquids  aspirin 81 mg orally every day prior to admission, now on aspirin 300 mg suppository daily  Ongoing aggressive stroke risk factor management  Therapy recommendations:  Pending  Disposition:  Pending  AMS - Could be due to hypernatremia - likely due to dehydration - need to correct slowly. Na 163 today. Slowly improving. - IV dextrose 5% with 20 mEq KCl at 150 mL per hour - bilateral MCA stroke can cause AMS - seizure needs to be ruled out - EEG pending  Agitation - Haldol 1 mg IM every 8 hours prn - Dr. Nona Dell recommends Seroquel but currently no access.   Hx of Afib - on documentation - family stated that they have not being told about hx of Afib - if comfirmed, she need life long anticoagulation - continue tele - multiple runs of probable SVT in the  140-160 range - would probably benefit from low-dose beta blocker.  Sepsis - treated in [facility]  - no brain image at that time - need repeat blood culture - Leukocytosis - [facility] Friday 21.1  -  Today 16.8 - TEE scheduled for Monday. Question if patient will be able to cooperate.  Hypertension  Home meds:  Lopressor 50 mg twice a day  Stable  Hyperlipidemia  Home meds:  No lipid lowering medications prior to admission.  LDL pending, goal < 70  Continue statin at discharge  Diabetes  HgbA1c pending goal < 7.0  Controlled  Nutrition  Patient would not allow NG tube to be placed - may need to sedate patient to place NG tube. Dr Loretha Brasil has asked me nursing staff to reattempt the NG tube while the patient is sedated with Haldol. She will need to be restrained afterwards to prevent removal.  Dr Catha Gosselin has ordered central line placement.  Family reports patient has not eaten in 3 weeks  Other Stroke Risk Factors  Advanced age  Obesity, Body mass index is 43.9 kg/(m^2).    Other Active Problems  Hypokalemia - 3.1 today  Elevated  creatinine  Leukocytosis - blood and sputum cultures pending   Mild congestive heart failure by chest x-ray 04/28/2014  Other Pertinent History  No regular medical follow-up for 17 years.  Hospital day # 3  Delton See [facility] Pager [phone] 04/30/2014, 8:46 AM  Haldol/seroquel as needed. Needs NG tube. Once in place please d/c the d10 and switch to NS for IVF. Pauletta Browns     To contact Stroke Continuity provider, please refer to [email or web address]. After hours, contact General Neurology

## 2014-04-30 NOTE — Progress Notes (Signed)
I tried to consent the husband for the TEE procedure, however despite me explaining the process of stroke may originating from the heart multiple times, husband states he does not believe the stroke is coming from the heart. Instead, he think her wife's condition was the result of OSH given her morphine. Husband has refused to consent for TEE procedure.  Please call cardiology if has any further question. It maybe helpful for neurology team to discuss with the husband and offer insight from neurology perspective. Please contact cardiology if he agrees to have this procedure performed on [name].   Thank you  P.s I was unable to page Neurology PA, I have paged Neurology MD to update, so far has not heard back yet.  Ramond Dial PA Pager: [phone]

## 2014-04-30 NOTE — Progress Notes (Signed)
Peripherally Inserted Central Catheter/Midline Placement  The IV Nurse has discussed with the patient and/or persons authorized to consent for the patient, the purpose of this procedure and the potential benefits and risks involved with this procedure.  The benefits include less needle sticks, lab draws from the catheter and patient may be discharged home with the catheter.  Risks include, but not limited to, infection, bleeding, blood clot (thrombus formation), and puncture of an artery; nerve damage and irregular heat beat.  Alternatives to this procedure were also discussed.  Consent obtained from husband via telephone.  PICC/Midline Placement Documentation  PICC / Midline Double Lumen 04/30/14 PICC Right Basilic 43 cm 0 cm (Active)  Indication for Insertion or Continuance of Line Poor Vasculature-patient has had multiple peripheral attempts or PIVs lasting less than 24 hours 04/30/2014 11:39 AM  Exposed Catheter (cm) 0 cm 04/30/2014 11:39 AM  Site Assessment Clean;Dry;Intact 04/30/2014 11:39 AM  Lumen #1 Status Flushed;Saline locked;Blood return noted 04/30/2014 11:39 AM  Lumen #2 Status Flushed;Saline locked;Blood return noted 04/30/2014 11:39 AM  Dressing Type Transparent 04/30/2014 11:39 AM  Dressing Status Clean;Dry;Antimicrobial disc in place;Intact 04/30/2014 11:39 AM  Line Care Connections checked and tightened 04/30/2014 11:39 AM  Line Adjustment (NICU/IV Team Only) No 04/30/2014 11:39 AM  Dressing Intervention New dressing 04/30/2014 11:39 AM  Dressing Change Due 05/07/14 04/30/2014 11:39 AM       Elliot Dally 04/30/2014, 11:40 AM

## 2014-04-30 NOTE — Progress Notes (Signed)
Received critical lab value of Sodium 163 and contacted Dr. Burnadette Peter, who would like to continue to monitor the patient since the value has been improving.

## 2014-05-01 ENCOUNTER — Inpatient Hospital Stay (HOSPITAL_COMMUNITY): Payer: Medicare Other

## 2014-05-01 ENCOUNTER — Other Ambulatory Visit: Payer: Self-pay | Admitting: *Deleted

## 2014-05-01 DIAGNOSIS — I4891 Unspecified atrial fibrillation: Secondary | ICD-10-CM

## 2014-05-01 DIAGNOSIS — R9089 Other abnormal findings on diagnostic imaging of central nervous system: Secondary | ICD-10-CM | POA: Insufficient documentation

## 2014-05-01 DIAGNOSIS — G9340 Encephalopathy, unspecified: Secondary | ICD-10-CM

## 2014-05-01 LAB — GLUCOSE, CAPILLARY
Glucose-Capillary: 127 mg/dL — ABNORMAL HIGH (ref 70–99)
Glucose-Capillary: 127 mg/dL — ABNORMAL HIGH (ref 70–99)
Glucose-Capillary: 130 mg/dL — ABNORMAL HIGH (ref 70–99)
Glucose-Capillary: 136 mg/dL — ABNORMAL HIGH (ref 70–99)
Glucose-Capillary: 152 mg/dL — ABNORMAL HIGH (ref 70–99)
Glucose-Capillary: 170 mg/dL — ABNORMAL HIGH (ref 70–99)
Glucose-Capillary: 180 mg/dL — ABNORMAL HIGH (ref 70–99)

## 2014-05-01 LAB — CBC
HCT: 34.7 % — ABNORMAL LOW (ref 36.0–46.0)
Hemoglobin: 10.7 g/dL — ABNORMAL LOW (ref 12.0–15.0)
MCH: 29.6 pg (ref 26.0–34.0)
MCHC: 30.8 g/dL (ref 30.0–36.0)
MCV: 95.9 fL (ref 78.0–100.0)
PLATELETS: 122 10*3/uL — AB (ref 150–400)
RBC: 3.62 MIL/uL — AB (ref 3.87–5.11)
RDW: 16.7 % — AB (ref 11.5–15.5)
WBC: 13.6 10*3/uL — AB (ref 4.0–10.5)

## 2014-05-01 LAB — BASIC METABOLIC PANEL
Anion gap: 5 (ref 5–15)
BUN: 15 mg/dL (ref 6–23)
CO2: 30 mmol/L (ref 19–32)
Calcium: 8.1 mg/dL — ABNORMAL LOW (ref 8.4–10.5)
Chloride: 126 mEq/L — ABNORMAL HIGH (ref 96–112)
Creatinine, Ser: 1.82 mg/dL — ABNORMAL HIGH (ref 0.50–1.10)
GFR calc Af Amer: 32 mL/min — ABNORMAL LOW (ref >90)
GFR, EST NON AFRICAN AMERICAN: 27 mL/min — AB (ref >90)
GLUCOSE: 182 mg/dL — AB (ref 70–99)
Potassium: 3 mmol/L — ABNORMAL LOW (ref 3.5–5.1)
SODIUM: 161 mmol/L — AB (ref 135–145)

## 2014-05-01 MED ORDER — DIAZEPAM 5 MG/ML IJ SOLN: 5.0000 mg | INTRAMUSCULAR | Status: DC | PRN

## 2014-05-01 MED ORDER — IOHEXOL 300 MG/ML  SOLN
50.0000 mL | Freq: Once | INTRAMUSCULAR | Status: AC | PRN
  Administered 2014-05-01: 30 mL

## 2014-05-01 MED ORDER — JEVITY 1.2 CAL PO LIQD: 1000.0000 mL | ORAL | Status: DC

## 2014-05-01 MED ORDER — SODIUM CHLORIDE 0.9 % IV SOLN
INTRAVENOUS | Status: DC
  Administered 2014-05-01: 1000 mL via INTRAVENOUS

## 2014-05-01 MED ORDER — ASPIRIN 325 MG PO TABS
325.0000 mg | ORAL_TABLET | Freq: Every day | ORAL | Status: DC
  Administered 2014-05-02 – 2014-05-17 (×14): 325 mg via ORAL
  Filled 2014-05-01 (×18): qty 1

## 2014-05-01 MED ORDER — POTASSIUM CHLORIDE 10 MEQ/100ML IV SOLN
10.0000 meq | INTRAVENOUS | Status: AC
  Administered 2014-05-01 (×5): 10 meq via INTRAVENOUS
  Filled 2014-05-01 (×4): qty 100

## 2014-05-01 MED ORDER — ASPIRIN 300 MG RE SUPP
300.0000 mg | Freq: Every day | RECTAL | Status: DC
  Administered 2014-05-01 – 2014-05-13 (×2): 300 mg via RECTAL
  Filled 2014-05-01 (×15): qty 1

## 2014-05-01 MED ORDER — GLUCERNA 1.2 CAL PO LIQD
1000.0000 mL | ORAL | Status: DC
  Administered 2014-05-01 – 2014-05-04 (×3): 1000 mL
  Filled 2014-05-01 (×9): qty 1000

## 2014-05-01 NOTE — Telephone Encounter (Signed)
[facility] 

## 2014-05-01 NOTE — Progress Notes (Signed)
STROKE TEAM PROGRESS NOTE   HISTORY [name] is a 69 y.o. female with a history of atrial fibrillation and recent hospitalization for sepsis, pneumonia rhabdomyolysis as well as hypernatremia and acute kidney injury, brought to the emergency room at [facility] for increased confusion and speech abnormality. Serum sodium was 169. CT scan of her head showed low density areas involving large area of left MCA territory as well as right temporal abnormality, with appearance indicative of probable subacute infarctions. MRI was recommended but could not be obtained because of patient's agitated state. She was transferred here for further evaluation and management with stroke service intervention.  LSN: Unclear tPA Given: No: CT abnormalities is an on presentation, as well as unclear when last known well. mRankin:  SUBJECTIVE (INTERVAL HISTORY) No family is present today. The patient's condition was unchanged. She got EEG and PANDA tube today for tube feeding. Husband refused TEE today. Na trending down.  OBJECTIVE Temp:  [98 F (36.7 C)-99.6 F (37.6 C)] 99.6 F (37.6 C) (01/04 2140) Pulse Rate:  [71-96] 96 (01/04 2140) Cardiac Rhythm:  [-]  Resp:  [24-30] 26 (01/04 2140) BP: (112-159)/(63-97) 123/83 mmHg (01/04 2140) SpO2:  [92 %-100 %] 98 % (01/04 2140) Weight:  [235 lb 11.2 oz (106.913 kg)] 235 lb 11.2 oz (106.913 kg) (01/04 0831)   Recent Labs Lab 05/01/14 0424 05/01/14 0823 05/01/14 1302 05/01/14 1616 05/01/14 2023  GLUCAP 170* 180* 136* 127* 127*    Recent Labs Lab 04/28/14 7829 04/28/14 1832 04/29/14 0350 04/30/14 0429 05/01/14 0635  NA 168* 169* 167* 163* 161*  K 3.5 3.0* 3.1* 3.2* 3.0*  CL >130* >130* >130* 127* 126*  CO2 26 27 26 27 30   GLUCOSE 89 119* 145* 173* 182*  BUN 16 16 17 17 15   CREATININE 1.84* 1.95* 1.96* 1.91* 1.82*  CALCIUM 8.7 8.6 8.1* 8.2* 8.1*  MG  --   --  1.8  --   --     Recent Labs Lab 04/27/14 1504  AST 33  ALT 17   ALKPHOS 315*  BILITOT 0.7  PROT 6.9  ALBUMIN 2.5*    Recent Labs Lab 04/27/14 1504 04/28/14 0634 04/30/14 0429 05/01/14 0635  WBC 18.8* 21.1* 16.8* 13.6*  NEUTROABS 16.3*  --  13.3*  --   HGB 12.2 12.3 11.6* 10.7*  HCT 40.0 37.6 36.8 34.7*  MCV 95.9 95.2 96.6 95.9  PLT 181 177 152 122*    Recent Labs Lab 04/27/14 1504  CKTOTAL 43  TROPONINI 0.06*   No results for input(s): LABPROT, INR in the last 72 hours.  Recent Labs  04/30/14 1236  COLORURINE AMBER*  LABSPEC 1.011  PHURINE 5.5  GLUCOSEU NEGATIVE  HGBUR LARGE*  BILIRUBINUR NEGATIVE  KETONESUR 15*  PROTEINUR 100*  UROBILINOGEN 1.0  NITRITE POSITIVE*  LEUKOCYTESUR LARGE*       Component Value Date/Time   CHOL 101 04/29/2014 0350   TRIG 182* 04/29/2014 0350   HDL 26* 04/29/2014 0350   CHOLHDL 3.9 04/29/2014 0350   VLDL 36 04/29/2014 0350   LDLCALC 39 04/29/2014 0350   Lab Results  Component Value Date   HGBA1C 12.1* 04/29/2014      Component Value Date/Time   LABOPIA POSITIVE* 04/18/2014 1754   COCAINSCRNUR NONE DETECTED 04/18/2014 1754   LABBENZ NONE DETECTED 04/18/2014 1754   AMPHETMU NONE DETECTED 04/18/2014 1754   THCU NONE DETECTED 04/18/2014 1754   LABBARB NONE DETECTED 04/18/2014 1754    No results for input(s): ETH in  the last 168 hours.  Ct Head Wo Contrast 04/27/2014    Moderate region of decreased density in the left parietal-occipital lobe concerning for subacute infarct. There is additional questionable region of decreased density in the right temporal lobe. Given the questioned multifocal involvement, MRI of the brain, preferably with contrast, recommended for further evaluation to exclude the possibility of underlying mass lesions.    MRI not able to be done due to agitation  Dg Chest [facility] 04/27/2014    No acute cardiopulmonary abnormality seen.  DG Chest 1 View 04/28/2014 Mild congestive heart failure  2D echo 04/18/14   - Left ventricle: The cavity size was  normal. Wall thickness was normal. Systolic function was normal. The estimated ejection fraction was in the range of 55% to 60%. - Left atrium: The atrium was mildly dilated.  Impressions: - Overall very poor image quality.  CUS -  1-39 right ICA stenosis. Unable to image left side secondary to confusion and non compliance.   Venous doppler - There is no obvious evidence of DVT or SVT noted in the bilateral lower extremities.   EEG -  1) Asymmetric PDR. 2) irregular delta activity.  Clinical Interpretation: This EEG is most consistent with a generalized cerebral dysfunction with a superimposed area of cortical dysfunction as evidenced by attenuated voltages in the left posterior quadrant. There was no seizure or seizure predisposition recorded on this study.    PHYSICAL EXAM  Temp:  [98 F (36.7 C)-99.6 F (37.6 C)] 99.6 F (37.6 C) (01/04 2140) Pulse Rate:  [71-96] 96 (01/04 2140) Resp:  [24-30] 26 (01/04 2140) BP: (112-159)/(63-97) 123/83 mmHg (01/04 2140) SpO2:  [92 %-100 %] 98 % (01/04 2140) Weight:  [235 lb 11.2 oz (106.913 kg)] 235 lb 11.2 oz (106.913 kg) (01/04 0831)  General - Well nourished, well developed, restless in bed, global aphasia.  Ophthalmologic - not able to test due to agitation.  Cardiovascular - Regular rate and rhythm with no murmur.  Neuro - awake alert, but restless and agitated, global aphasia, does not follow simple commands, word salad, not able to name or repeat. No facial asymmetry. PERRL, EOMI, move all extremities spontaneously and equally. Reflex 1+ and no babinski.  ASSESSMENT/PLAN [name] is a 69 y.o. female with history of atrial fibrillation, diabetes mellitus, recent sepsis, presenting with confusion and speech abnormalities . She did not receive IV t-PA due to unknown time of onset.  Stroke: Bilateral MCA embolic infarcts - etiology not clear but likely due to atrial fibrillation or  Endocarditis due to sepsis.  Resultant   confusion/global aphasia  MRI   (not yet performed secondary to agitation) - consider MRI once agitation resolved.  Carotid Doppler - 1-39 right ICA stenosis. Unable to image left side secondary to confusion and non compliance.   2D Echo EF 55-60%. Poor images. No cardiac source of emboli identified.  LE doppler - negative for DVT  LDL  39  HgbA1c  12.1, not at goal  Lovenox for VTE prophylaxis   Diet NPO time specified no liquids, on tube feeding  aspirin 81 mg orally every day prior to admission, now on aspirin 300 mg suppository daily or ASA  via PANDA  Ongoing aggressive stroke risk factor management  Therapy recommendations:  Pending  Disposition:  Pending  AMS - Could be due to hypernatremia - likely due to dehydration - need to correct slowly. Na 161 today. Slowly improving. - IV dextrose 5% with 20 mEq KCl  at 150 mL per hour - bilateral MCA stroke can cause AMS - EEG no seizure - seroquel  Qhs   Hx of Afib - on documentation - family stated that they have not being told about hx of Afib - if comfirmed, she need life long anticoagulation - continue tele - multiple runs of probable SVT in the 140-160 range - would probably benefit from low-dose beta blocker.  Sepsis - treated in [facility]  - no brain image at that time - 04/28/14 blood culture NGTD - Leukocytosis - WBC's Friday 21.1->16.8->13.6 - husband refused TEE to rule out endocarditis  Hypertension  Home meds:  Lopressor 50 mg twice a day  Stable  Hyperlipidemia  Home meds:  No lipid lowering medications prior to admission.  LDL 39, goal < 70  Diabetes  HgbA1c 12.1 goal < 7.0  Uncontrolled  SSI  Close monitoring  Nutrition  S/p PANDA   On tube feeding  Other Stroke Risk Factors  Advanced age  Obesity, Body mass index is 43.1 kg/(m^2).    Other Active Problems  Hypokalemia - 3.1 today  Elevated creatinine  Leukocytosis - blood and sputum cultures NGTD   Mild  congestive heart failure by chest x-ray 04/28/2014  Other Pertinent History  No regular medical follow-up for 17 years.   Hospital day # 4  Marvel Plan, MD PhD Stroke Neurology 05/01/2014 10:00 PM   To contact Stroke Continuity provider, please refer to [email or web address]. After hours, contact General Neurology

## 2014-05-01 NOTE — Procedures (Signed)
History: 69 yo F with L posterior mass lesion vs infarct.   Sedation: None  Technique: This is a 17 channel routine scalp EEG performed at the bedside with bipolar and monopolar montages arranged in accordance to the international 10/20 system of electrode placement. One channel was dedicated to EKG recording.    Background: The background consists of a posterior rhythm as well as generalized irregular theta and delta activity. This appears more prominent on the right than left. There is a  posterior dominant rhythm of 8 Hz that is better seen on the right than left.   Photic stimulation: Physiologic driving is not performed  EEG Abnormalities: 1) Asymmetric PDR 2) irregular delta activity.   Clinical Interpretation: This EEG is most consistent with a generalized cerebral dysfunction with a superimposed area of cortical dysfunction as evidenced by attenuated voltages in the left posterior quadrant. There was no seizure or seizure predisposition recorded on this study.   Ritta Slot, MD [facility] [phone]  If 7pm- 7am, please page neurology on call as listed in AMION.

## 2014-05-01 NOTE — Progress Notes (Signed)
NUTRITION FOLLOW UP  Intervention:    Once NGT placed and placement verified, initiate TF via NGT with Glucerna 1.2 at 20 ml/h, increase by 10 ml every 4 hours to goal rate of 65 ml/h to provide 1872 kcals, 94 gm protein, 1256 ml free water daily.  Nutrition Dx:   Inadequate oral intake related to AMS as evidenced by NPO status. Ongoing.  Goal:   Intake to meet >90% of estimated nutrition needs.  Monitor:   TF initiation/tolerance/adequacy, weight trend, labs, swallowing function and ability to begin PO diet.  Assessment:   69 y.o. female with a history of atrial fibrillation and recent hospitalization for sepsis, pneumonia rhabdomyolysis as well as hypernatremia and acute kidney injury, brought to the emergency room at [facility] for increased confusion and speech abnormality.  SLP following for ability to advance PO diet. Remains NPO at this time. IR consulted for NGT placement. Received MD Consult for TF initiation and management.  Height: Ht Readings from Last 1 Encounters:  04/27/14  (1.575 m)    Weight Status:   Wt Readings from Last 1 Encounters:  05/01/14 235 lb 11.2 oz (106.913 kg)    Re-estimated needs:  Kcal: 1700-1950 Protein: 90-100 gm Fluid: 2-2.5 L  Skin: stage 2 pressure ulcer to buttocks  Diet Order: Diet NPO time specified   Intake/Output Summary (Last 24 hours) at 05/01/14 1509 Last data filed at 05/01/14 0716  Gross per 24 hour  Intake      0 ml  Output   2350 ml  Net  -2350 ml    Last BM: 12/31   Labs:   Recent Labs Lab 04/29/14 0350 04/30/14 0429 05/01/14 0635  NA 167* 163* 161*  K 3.1* 3.2* 3.0*  CL >130* 127* 126*  CO2 BUN CREATININE 1.96* 1.91* 1.82*  CALCIUM 8.1* 8.2* 8.1*  MG 1.8  --   --   GLUCOSE 145* 173* 182*    CBG (last 3)   Recent Labs  05/01/14 0424 05/01/14 0823 05/01/14 1302  GLUCAP 170* 180* 136*    Scheduled Meds: . antiseptic oral rinse  7 mL Mouth Rinse BID  .  aspirin  325 mg Oral Daily   Or  . aspirin  300 mg Rectal Daily  . enoxaparin (LOVENOX) injection  50 mg Subcutaneous Q24H  . Influenza vac split quadrivalent PF  0.5 mL Intramuscular Tomorrow-1000  . insulin aspart  0-9 Units Subcutaneous Q4H  . potassium chloride  10 mEq Intravenous Q1 Hr x 5  . QUEtiapine  25 mg Oral QHS  . sodium chloride  3 mL Intravenous Q12H  . terbinafine   Topical BID    Continuous Infusions: . dextrose 5 % 1,000 mL with potassium chloride 20 mEq infusion 150 mL/hr at 05/01/14 1126     Joaquin Courts, RD, LDN, CNSC Pager [phone] After Hours Pager [phone]

## 2014-05-01 NOTE — Progress Notes (Signed)
SLP Cancellation Note  Patient Details Name: [name] MRN: [id number] DOB: January 31, 1946   Cancelled treatment:       Reason Eval/Treat Not Completed: Patient at procedure or test/unavailable. Attempted to see pt x2, in procedures   Somnang Mahan, Riley Nearing 05/01/2014, 12:08 PM

## 2014-05-01 NOTE — Progress Notes (Signed)
EEG Completed; Results Pending  

## 2014-05-01 NOTE — Progress Notes (Signed)
CRITICAL VALUE ALERT  Critical value received:  Sodium 161   Date of notification:  05/01/2014   Time of notification:  7:50 AM   Critical value read back:yes  Nurse who received alert:  Andrew Au  MD notified (1st page): 05/01/2014 Nunzio Cory., MD   Time of first page:  7:51 AM  Responding MD:  Jesse Fall  Time MD responded:  [phone]  No new orders; MD aware   Andrew Au I 05/01/2014 1:08 PM

## 2014-05-01 NOTE — Progress Notes (Signed)
[facility]                                                                              Patient Demographics  [name], is a 69 y.o. female, DOB - Sep 02, 1945, ZOX:[id number]  Admit date - 04/27/2014   Admitting Physician Maryruth Bun Rama, MD  Outpatient Primary MD for the patient is No primary care provider on file.  LOS - 4   Chief Complaint  Patient presents with  . Altered Mental Status      HPI on 04/27/2014 by Dr. Trula Ore Rama [name] is an 69 y.o. female with a PMH of atrial fibillation, recently hospitalized 04/17/14-04/25/14 for treatment of sepsis, pneumonia, rhabdomyolysis, and demand ischemia from afib w/ RVR. She was also hypernatremic at discharge with a sodium of 154 and a creatinine of 1.74. She was discharged on lasix. According to notes, she was back to her normal baseline mental status at discharge. She had routine labs drawn today, for hospital follow up of electrolytes, and her sodium had increased to 169. Her creatinine is 1.66. Her mental status has deteriorated and she has been weak all day, according to family, who visited her today at her facility to have lunch with her. The patient is very confused and unable to provide any additional information. Denies pain, shortness of breath and cough. Denies nausea and vomiting. Keeps repeating "I need air" over and over again.  Assessment & Plan   Acute  Encephalopathy/?Bilateral MCA territory Infarcts -CT head: moderate region of decreased density in left parietal-occipital lobe -MRI brain pending -Neurology consulted and appreciated -Echocardiogram from 12/22: Normal EF -Speech, PT, OT consulted -Speech recommended NPO and alternate means of nutrition, however, unable to place NGtube -Neurology recommended TEE but husband refused -Right carotid doppler: 1-39% ICA stenosis, unable to image left due to patient noncompliance -Lower ext doppler: negative for DVT or SVT -Blood cultures  pending -UA: shows 7-10 WBC, small leukocytes -Pending urine culture  Hypernatremia/Hyperchloremia -Likely secondary to dehydration and poor oral intake complicated by diuretics -Lasix held -Continue IVF, continue  D5W (Spoke with nephrology via phone) -Continue to monitor BMP  Hypokalemia -Secondary to diurectics -Will replace and continue to monitor -Magnesium 1.8  Weakness -Cannot assess at this time -per brother, patient has been weak and not eating properly for some time  Intertigo Dermatitis -Continue lamisil  Atrial fibrillation -Currently rate controlled -Continue metoprolol and aspirin  Diabetes mellitus, Type 2 -Continue lantus, ISS and CBG monitoring  Chronic kidney disease, stage III -Cr at baseline, continue to monitor BMP  Nutritional status -Asked IR to place tube for feedings -nutrition consulted  Code Status: Full  Family Communication: None at bedside, attempted to contact husband  Disposition Plan: Admitted  Time Spent in minutes   30 minutes  Procedures  None  Consults   Neurology Nephrology, via phone Cardiology  DVT Prophylaxis  Lovenox  Lab Results  Component Value Date   PLT 122* 05/01/2014    Medications  Scheduled Meds: . antiseptic oral rinse  7 mL Mouth Rinse BID  . aspirin  325 mg Oral Daily   Or  . aspirin  300 mg Rectal Daily  . enoxaparin (LOVENOX)  injection  50 mg Subcutaneous Q24H  . Influenza vac split quadrivalent PF  0.5 mL Intramuscular Tomorrow-1000  . insulin aspart  0-9 Units Subcutaneous Q4H  . QUEtiapine  25 mg Oral QHS  . sodium chloride  3 mL Intravenous Q12H  . terbinafine   Topical BID   Continuous Infusions: . dextrose 5 % 1,000 mL with potassium chloride 20 mEq infusion 150 mL/hr at 05/01/14 0246   PRN Meds:.[DISCONTINUED] acetaminophen **OR** acetaminophen, haloperidol lactate, [DISCONTINUED] ondansetron **OR** ondansetron (ZOFRAN) IV, sodium chloride  Antibiotics    Anti-infectives     None      Subjective:   [name] seen and examined today.  Patient continues to be confused.   Objective:   Filed Vitals:   04/30/14 2100 05/01/14 0107 05/01/14 0831 05/01/14 0932  BP: 132/67 112/77  150/97  Pulse: 86 92  73  Temp:  98.7 F (37.1 C)  98.7 F (37.1 C)  TempSrc:  Oral  Axillary  Resp: 36 26  30  Height:      Weight:   106.913 kg (235 lb 11.2 oz)   SpO2: 93% 100%  92%    Wt Readings from Last 3 Encounters:  05/01/14 106.913 kg (235 lb 11.2 oz)  04/20/14 114.7 kg (252 lb 13.9 oz)     Intake/Output Summary (Last 24 hours) at 05/01/14 1052 Last data filed at 05/01/14 0716  Gross per 24 hour  Intake      0 ml  Output   2350 ml  Net  -2350 ml    Exam  General: Well developed, well nourished, NAD, appears stated age  HEENT: NCAT, mucous membranes moist.   Cardiovascular: S1 S2 auscultated, irregular  Respiratory: Clear to auscultation bilaterally with equal chest rise  Abdomen: Soft, obese, nontender, nondistended, + bowel sounds  Extremities: warm dry without cyanosis clubbing.  Trace edema LLE. Mittens  Neuro: Awake, not oriented, follows some commands, moves all extremities   Data Review   Micro Results Recent Results (from the past 240 hour(s))  Clostridium Difficile by PCR     Status: None   Collection Time: 04/23/14  7:07 AM  Result Value Ref Range Status   C difficile by pcr NEGATIVE NEGATIVE Final    Comment: Performed at [facility]  MRSA PCR Screening     Status: None   Collection Time: 04/27/14  7:04 PM  Result Value Ref Range Status   MRSA by PCR NEGATIVE NEGATIVE Final    Comment:        The GeneXpert MRSA Assay (FDA approved for NASAL specimens only), is one component of a comprehensive MRSA colonization surveillance program. It is not intended to diagnose MRSA infection nor to guide or monitor treatment for MRSA infections.   Culture, blood (routine x 2)     Status: None (Preliminary result)   Collection  Time: 04/28/14  9:41 AM  Result Value Ref Range Status   Specimen Description BLOOD RIGHT HAND  Final   Special Requests BOTTLES DRAWN AEROBIC ONLY 2CC  Final   Culture   Final           BLOOD CULTURE RECEIVED NO GROWTH TO DATE CULTURE WILL BE HELD FOR 5 DAYS BEFORE ISSUING A FINAL NEGATIVE REPORT Performed at [facility]    Report Status PENDING  Incomplete  Culture, blood (routine x 2)     Status: None (Preliminary result)   Collection Time: 04/28/14  9:50 AM  Result Value Ref Range Status  Specimen Description BLOOD LEFT HAND  Final   Special Requests BOTTLES DRAWN AEROBIC ONLY 1CC  Final   Culture   Final           BLOOD CULTURE RECEIVED NO GROWTH TO DATE CULTURE WILL BE HELD FOR 5 DAYS BEFORE ISSUING A FINAL NEGATIVE REPORT Performed at [facility]    Report Status PENDING  Incomplete    Radiology Reports Ct Abdomen Pelvis Wo Contrast  04/18/2014   CLINICAL DATA:  Status post fall; found on floor. Sepsis, with progressive weakness for several weeks. Erythema at the groin and abdominal wall. Leukocytosis. Initial encounter.  EXAM: CT ABDOMEN AND PELVIS WITHOUT CONTRAST  TECHNIQUE: Multidetector CT imaging of the abdomen and pelvis was performed following the standard protocol without IV contrast.  COMPARISON:  Lumbar spine radiograph performed 04/17/2014  FINDINGS: Right lower lobe airspace opacification is compatible with pneumonia.  The liver and spleen are unremarkable in appearance. Scattered stones are seen within the gallbladder. The gallbladder is otherwise unremarkable. The pancreas and adrenal glands are unremarkable.  Nonspecific perinephric stranding is noted bilaterally. Mild right-sided pelvicaliectasis remains within normal limits. There is no evidence of hydronephrosis. No renal or ureteral stones are seen.  No free fluid is identified. The small bowel is unremarkable in appearance. The stomach is within normal limits. No acute vascular abnormalities are  seen. Minimal calcification is seen along the abdominal aorta and its branches.  The appendix is not definitely seen; there is no evidence for appendicitis. The colon is unremarkable in appearance.  A small-to-moderate umbilical hernia is noted, containing only fat.  The bladder is largely decompressed and grossly unremarkable. A large 8.0 cm fibroid is suspected within the uterus. The uterus is mildly enlarged. The ovaries are grossly symmetric. No suspicious adnexal masses are seen. Stop No inguinal lymphadenopathy is seen.  No acute osseous abnormalities are identified.  IMPRESSION: 1. Right lower lobe pneumonia noted; this likely explains the patient's symptoms. 2. Cholelithiasis; gallbladder otherwise unremarkable. 3. Small to moderate umbilical hernia, containing only fat. 4. Mildly enlarged fibroid uterus noted.   Electronically Signed   By: Roanna Raider M.D.   On: 04/18/2014 05:07   Dg Chest 1 View  04/28/2014   CLINICAL DATA:  Fever. History of atrial fibrillation. Patient had a recent hospitalization for sepsis and pneumonia.  EXAM: CHEST - 1 VIEW  COMPARISON:  April 27, 2014  FINDINGS: The heart size and mediastinal contours are stable. The heart size is enlarged. There is mild diffuse increased pulmonary interstitium. There is no focal pneumonia or pleural effusion. The visualized skeletal structures are stable.  IMPRESSION: Mild congestive heart failure.   Electronically Signed   By: Sherian Rein M.D.   On: 04/28/2014 08:38   Dg Chest 2 View  04/17/2014   CLINICAL DATA:  Pt arrived via EMS with report of falling without injury d/t "legs given out" on concrete garage floor since 1300 and was found by spouse ast 1845. EMS reported that CBG was greater than 600, poor appetite, noncompliant with diet d/t noted soft drinks and sweeten tea in house, strong urine smell, and red rash to abd folds. Pt c/o lower back pain, large bruising to left hip and groin, denies any chest complaints, best  obtainable images due to pt size and condition  EXAM: CHEST  2 VIEW  COMPARISON:  None.  FINDINGS: There are low lung volumes with patient rotation to the right. The lateral view is limited. There is elevation of the right  hemidiaphragm. The aorta appears ectatic and somewhat unfolded. The heart size is normal. Mild atelectasis is present at the right lung base. There is no consolidation, significant pleural effusion or pneumothorax. No acute osseous findings identified.  IMPRESSION: Elevated right hemidiaphragm with associated right basilar atelectasis. No consolidation or significant edema.   Electronically Signed   By: Roxy Horseman M.D.   On: 04/17/2014 21:36   Dg Lumbar Spine 2-3 Views  04/17/2014   CLINICAL DATA:  Fall.  Weakness.  Initial evaluation.  EXAM: LUMBAR SPINE - 2-3 VIEW  COMPARISON:  None.  FINDINGS: Five lumbar type vertebral bodies are present. There is mild levoscoliosis. Vertebral bodies are otherwise normally aligned with preservation of the normal lumbar lordosis.  There is anterior wedging of the L1 and L2 vertebral bodies, favored to be chronic in nature. Vertebral body heights are otherwise preserved. No definite acute fracture listhesis.  No soft tissue abnormality.  Moderate multilevel degenerative disc disease present within the visualized spine.  IMPRESSION: 1. No definite acute traumatic injury within the lumbar spine. 2. Anterior wedging of the L1 and L2 vertebral bodies, favored to be chronic in nature. Possible acute compression fractures are not entirely excluded. Correlation with site of pain recommended.   Electronically Signed   By: Rise Mu M.D.   On: 04/17/2014 21:37   Dg Pelvis 1-2 Views  04/17/2014   CLINICAL DATA:  Patient fell on concrete floor earlier in the day  EXAM: PELVIS - 1-2 VIEW  COMPARISON:  None.  FINDINGS: There is no evidence of pelvic fracture or dislocation. 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 No erosive change.  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 No fracture or dislocation.   Electronically Signed   By: Bretta Bang M.D.   On: 04/17/2014 21:37   Dg Tibia/fibula Left  04/17/2014   CLINICAL DATA:  Status post fall; acute onset of left lower leg pain. Initial encounter.  EXAM: LEFT TIBIA AND FIBULA - 2 VIEW  COMPARISON:  None.  FINDINGS: There is no evidence of fracture or dislocation. The tibia and fibula appear intact.  Marginal osteophytes are seen arising at the medial and lateral compartments of the knee, with wall osteophytes also seen. No knee joint effusion is identified.  The ankle mortise is incompletely assessed, but appears grossly unremarkable. A plantar calcaneal spur is incidentally noted. No significant soft tissue abnormalities are identified.  IMPRESSION: No evidence of fracture or dislocation.   Electronically Signed   By: Roanna Raider M.D.   On: 04/17/2014 22:52   Ct Head Wo Contrast  04/27/2014   CLINICAL DATA:  Altered mental status.  EXAM: CT HEAD WITHOUT CONTRAST  TECHNIQUE: Contiguous axial images were obtained from the base of the skull through the vertex without intravenous contrast.  COMPARISON:  None.  FINDINGS: There is ill-defined decreased density involving the moderate portion of the left posterior parietal occipital lobes extending from the lateral ventricle of the cortex. There is an additional questionable region of decreased density in the right temporal lobe versus volume averaging. There is no associated hemorrhage. There is no midline shift. There is mild prominence of the extra-axial CSF space on the right, may be related to atrophy versus subdural hygroma. The calvarium is intact. Included paranasal sinuses and mastoid air cells are well aerated.  IMPRESSION: Moderate region of decreased density in  the left parietal-occipital lobe concerning for  subacute infarct. There is additional questionable region of decreased density in the right temporal lobe. Given the questioned multifocal involvement, MRI of the brain, preferably with contrast, recommended for further evaluation to exclude the possibility of underlying mass lesions.  These results were called by telephone at the time of interpretation on 04/27/2014 at 3:28 pm to Dr. Pricilla Loveless , who verbally acknowledged these results.   Electronically Signed   By: Rubye Oaks M.D.   On: 04/27/2014 15:31   Dg Chest Port 1 View  04/27/2014   CLINICAL DATA:  Altered mental status.  EXAM: PORTABLE CHEST - 1 VIEW  COMPARISON:  April 17, 2014.  FINDINGS: The heart size and mediastinal contours are within normal limits. Both lungs are clear. No pneumothorax or pleural effusion is noted. The visualized skeletal structures are unremarkable.  IMPRESSION: No acute cardiopulmonary abnormality seen.   Electronically Signed   By: Roque Lias M.D.   On: 04/27/2014 14:43    CBC  Recent Labs Lab 04/27/14 1504 04/28/14 0634 04/30/14 0429 05/01/14 0635  WBC 18.8* 21.1* 16.8* 13.6*  HGB 12.2 12.3 11.6* 10.7*  HCT 40.0 37.6 36.8 34.7*  PLT 181 177 152 122*  MCV 95.9 95.2 96.6 95.9  MCH 29.3 31.1 30.4 29.6  MCHC 30.5 32.7 31.5 30.8  RDW 16.7* 16.9* 16.8* 16.7*  LYMPHSABS 1.5  --  2.4  --   MONOABS 0.8  --  0.9  --   EOSABS 0.1  --  0.2  --   BASOSABS 0.0  --  0.0  --     Chemistries   Recent Labs Lab 04/27/14 1504 04/28/14 0634 04/28/14 1832 04/29/14 0350 04/30/14 0429 05/01/14 0635  NA 166* 168* 169* 167* 163* 161*  K 3.2* 3.5 3.0* 3.1* 3.2* 3.0*  CL 128* >130* >130* >130* 127* 126*  CO2 GLUCOSE 102* 89 119* 145* 173* 182*  BUN CREATININE 1.66* 1.84* 1.95* 1.96* 1.91* 1.82*  CALCIUM 8.7 8.7 8.6 8.1* 8.2* 8.1*  MG  --   --   --  1.8  --   --   AST 33  --   --   --   --   --   ALT 17  --   --   --   --   --   ALKPHOS 315*  --   --   --    --   --   BILITOT 0.7  --   --   --   --   --    ------------------------------------------------------------------------------------------------------------------ estimated creatinine clearance is 33.5 mL/min (by C-G formula based on Cr of 1.82). ------------------------------------------------------------------------------------------------------------------  Recent Labs  04/29/14 0350  HGBA1C 12.1*   ------------------------------------------------------------------------------------------------------------------  Recent Labs  04/29/14 0350  CHOL 101  HDL 26*  LDLCALC 39  TRIG 130*  CHOLHDL 3.9   ------------------------------------------------------------------------------------------------------------------ No results for input(s): TSH, T4TOTAL, T3FREE, THYROIDAB in the last 72 hours.  Invalid input(s): FREET3 ------------------------------------------------------------------------------------------------------------------ No results for input(s): VITAMINB12, FOLATE, FERRITIN, TIBC, IRON, RETICCTPCT in the last 72 hours.  Coagulation profile No results for input(s): INR, PROTIME in the last 168 hours.  No results for input(s): DDIMER in the last 72 hours.  Cardiac Enzymes  Recent Labs Lab 04/27/14 1504  TROPONINI 0.06*   ------------------------------------------------------------------------------------------------------------------ Invalid input(s): POCBNP    Jamarrion Budai D.O. on 05/01/2014 at 10:52 AM  Between 7am to 7pm - Pager -  [phone]  After 7pm go to [email or web address] - password [email or web address]  And look for the night coverage person covering for me after hours  [facility] Office  [phone]

## 2014-05-02 ENCOUNTER — Inpatient Hospital Stay (HOSPITAL_COMMUNITY): Payer: Medicare Other

## 2014-05-02 LAB — BASIC METABOLIC PANEL
ANION GAP: 9 (ref 5–15)
BUN: 19 mg/dL (ref 6–23)
CO2: 28 mmol/L (ref 19–32)
Calcium: 8.2 mg/dL — ABNORMAL LOW (ref 8.4–10.5)
Chloride: 125 mEq/L — ABNORMAL HIGH (ref 96–112)
Creatinine, Ser: 1.83 mg/dL — ABNORMAL HIGH (ref 0.50–1.10)
GFR calc Af Amer: 31 mL/min — ABNORMAL LOW (ref >90)
GFR, EST NON AFRICAN AMERICAN: 27 mL/min — AB (ref >90)
GLUCOSE: 170 mg/dL — AB (ref 70–99)
Potassium: 3.4 mmol/L — ABNORMAL LOW (ref 3.5–5.1)
SODIUM: 162 mmol/L — AB (ref 135–145)

## 2014-05-02 LAB — CULTURE, BLOOD (ROUTINE X 2)
Culture: NO GROWTH
Culture: NO GROWTH

## 2014-05-02 LAB — GLUCOSE, CAPILLARY
GLUCOSE-CAPILLARY: 183 mg/dL — AB (ref 70–99)
GLUCOSE-CAPILLARY: 162 mg/dL — AB (ref 70–99)
Glucose-Capillary: 151 mg/dL — ABNORMAL HIGH (ref 70–99)
Glucose-Capillary: 186 mg/dL — ABNORMAL HIGH (ref 70–99)
Glucose-Capillary: 200 mg/dL — ABNORMAL HIGH (ref 70–99)

## 2014-05-02 LAB — CBC
HEMATOCRIT: 36.5 % (ref 36.0–46.0)
HEMOGLOBIN: 11.1 g/dL — AB (ref 12.0–15.0)
MCH: 29.1 pg (ref 26.0–34.0)
MCHC: 30.4 g/dL (ref 30.0–36.0)
MCV: 95.8 fL (ref 78.0–100.0)
PLATELETS: 151 10*3/uL (ref 150–400)
RBC: 3.81 MIL/uL — ABNORMAL LOW (ref 3.87–5.11)
RDW: 16.5 % — AB (ref 11.5–15.5)
WBC: 13.8 10*3/uL — AB (ref 4.0–10.5)

## 2014-05-02 LAB — URINE CULTURE

## 2014-05-02 LAB — THYROGLOBULIN ANTIBODY: THYROGLOBULIN AB: 1 [IU]/mL (ref <2)

## 2014-05-02 LAB — THYROID PEROXIDASE ANTIBODY: THYROID PEROXIDASE ANTIBODY: 733 [IU]/mL — AB (ref <9)

## 2014-05-02 MED ORDER — FREE WATER
200.0000 mL | Status: DC
  Administered 2014-05-02 (×2): 200 mL

## 2014-05-02 MED ORDER — CEFTRIAXONE SODIUM IN DEXTROSE 40 MG/ML IV SOLN
2.0000 g | INTRAVENOUS | Status: DC
  Administered 2014-05-02 – 2014-05-03 (×2): 2 g via INTRAVENOUS
  Filled 2014-05-02 (×3): qty 50

## 2014-05-02 MED ORDER — FREE WATER
200.0000 mL | Freq: Three times a day (TID) | Status: DC
  Administered 2014-05-02 (×2): 200 mL

## 2014-05-02 MED ORDER — SODIUM CHLORIDE 0.9 % IV SOLN
INTRAVENOUS | Status: DC
  Administered 2014-05-02 – 2014-05-03 (×2): via INTRAVENOUS

## 2014-05-02 NOTE — Progress Notes (Signed)
Speech Language Pathology Treatment: Dysphagia  Patient Details Name: [name] MRN: [id number] DOB: 04/03/1946 Today's Date: 05/02/2014 Time: [id number] SLP Time Calculation (min) (ACUTE ONLY): 32 min  Assessment / Plan / Recommendation Clinical Impression  Pt demonstrates decreased arousal today, which was beneficial in facilitating oral care as pt was not as strong or aware when resisting efforts. SLP removed large chunks of dried bloody secretions from tongue and hard palate and was able to loosen some material from the posterior pharyngeal wall. After oral care completed SLP used max tactile cues to facilitate oral intake of two ice chips and a teaspoon of ice cream. Even though pt spit out the majority of each bolus, she did trigger a swallow each time with oral residuals. Though oral hygiene is improving, pt is still not cognitively capable of taking PO since she pushes awake all cups and straws and spits out food and drink. Will continue efforts; feeding tube still necessary.    HPI HPI: [name] is an 69 y.o. female with a history of atrial fibrillation and recent hospitalization for sepsis, pneumonia rhabdomyolysis as well as hypernatremia and acute kidney injury, [facility] for increased confusion and speech abnormality. Serum sodium was 169. CT scan of her head showed low density areas involving large area of left MCA territory as well as right temporal abnormality, with appearance indicative of probable subacute infarctions. MRI was recommended but could not be obtained because of patient's agitated state.   Pertinent Vitals Pain Assessment: Faces Faces Pain Scale: Hurts a little bit  SLP Plan  Continue with current plan of care    Recommendations Diet recommendations: NPO Medication Administration: Via alternative means              Oral Care Recommendations: Oral care Q4 per protocol Follow up Recommendations: Skilled Nursing  facility Plan: Continue with current plan of care    GO    [facility] Rehabilitation CenterBonnie Daion Ginsberg, MA CCC-SLP [phone]  [name], [name] 05/02/2014, 9:20 AM

## 2014-05-02 NOTE — Progress Notes (Signed)
NUTRITION FOLLOW UP  Intervention:   Continue Glucerna 1.2 at goal rate of 65 ml/h to provide 1872 kcals, 94 gm protein, 1256 ml free water daily. Free water per MD  Nutrition Dx:   Inadequate oral intake related to AMS as evidenced by NPO status. Ongoing.  Goal:   Intake to meet >90% of estimated nutrition needs; being met  Monitor:   TF initiation/tolerance/adequacy, weight trend, labs, swallowing function and ability to begin PO diet.  Assessment:   69 y.o. female with a history of atrial fibrillation and recent hospitalization for sepsis, pneumonia rhabdomyolysis as well as hypernatremia and acute kidney injury, brought to the emergency room at [facility] for increased confusion and speech abnormality.  Pt now has NGT in place with Glucerna 1.2 infusing at 65 ml/hr which is providing 1872 kcals, 94 gm protein, 1256 ml free water daily. Pt is also receiving 200 ml of free water every 4 hours, to provide an additional 1200 ml daily, for a total of 2456 ml daily. Per RN pt is tolerating tube feedings well; 0 ml gastric residuals. Per nursing notes pt had a BM on 04/27/14 and 05/02/14. Labs: elevated sodium, elevated glucose, low potassium, low calcium, low GFR  Height: Ht Readings from Last 1 Encounters:  04/27/14 5' 2"  (1.575 m)    Weight Status:   Wt Readings from Last 1 Encounters:  05/02/14 235 lb 1.6 oz (106.641 kg)  04/29/14 238 lb 04/20/14 252 lb  Re-estimated needs:  Kcal: 1700-1950 Protein: 90-100 gm Fluid: 2-2.5 L  Skin: stage 2 pressure ulcer to buttocks  Diet Order: Diet NPO time specified Diet NPO time specified   Intake/Output Summary (Last 24 hours) at 05/02/14 1722 Last data filed at 05/02/14 1630  Gross per 24 hour  Intake     63 ml  Output   2150 ml  Net  -2087 ml    Last BM: 1/5   Labs:   Recent Labs Lab 04/29/14 0350 04/30/14 0429 05/01/14 0635 05/02/14 0410  NA 167* 163* 161* 162*  K 3.1* 3.2* 3.0* 3.4*  CL >130* 127* 126*  125*  CO2 26 27 30 28   BUN 17 17 15 19   CREATININE 1.96* 1.91* 1.82* 1.83*  CALCIUM 8.1* 8.2* 8.1* 8.2*  MG 1.8  --   --   --   GLUCOSE 145* 173* 182* 170*    CBG (last 3)   Recent Labs  05/02/14 0401 05/02/14 0803 05/02/14 1224  GLUCAP 151* 186* 183*    Scheduled Meds: . antiseptic oral rinse  7 mL Mouth Rinse BID  . aspirin  325 mg Oral Daily   Or  . aspirin  300 mg Rectal Daily  . cefTRIAXone (ROCEPHIN)  IV  2 g Intravenous Q24H  . enoxaparin (LOVENOX) injection  50 mg Subcutaneous Q24H  . free water  200 mL Per Tube Q4H  . Influenza vac split quadrivalent PF  0.5 mL Intramuscular Tomorrow-1000  . insulin aspart  0-9 Units Subcutaneous Q4H  . sodium chloride  3 mL Intravenous Q12H  . terbinafine   Topical BID    Continuous Infusions: . feeding supplement (GLUCERNA 1.2 CAL) 1,000 mL (05/01/14 2051)     Pryor Ochoa RD, LDN Inpatient Clinical Dietitian Pager: [phone] After Hours Pager: [phone]

## 2014-05-02 NOTE — Progress Notes (Addendum)
[facility]                                                                              Patient Demographics  [name], is a 69 y.o. female, DOB - 30-Aug-1945, ZOX:[id number]  Admit date - 04/27/2014   Admitting Physician Maryruth Bun Rama, MD  Outpatient Primary MD for the patient is No primary care provider on file.  LOS - 5   Chief Complaint  Patient presents with  . Altered Mental Status      HPI on 04/27/2014 by Dr. Trula Ore Rama MARIGENE [name] is an 69 y.o. female with a PMH of atrial fibillation, recently hospitalized 04/17/14-04/25/14 for treatment of sepsis, pneumonia, rhabdomyolysis, and demand ischemia from afib w/ RVR. She was also hypernatremic at discharge with a sodium of 154 and a creatinine of 1.74. She was discharged on lasix. According to notes, she was back to her normal baseline mental status at discharge. She had routine labs drawn today, for hospital follow up of electrolytes, and her sodium had increased to 169. Her creatinine is 1.66. Her mental status has deteriorated and she has been weak all day, according to family, who visited her today at her facility to have lunch with her. The patient is very confused and unable to provide any additional information. Denies pain, shortness of breath and cough. Denies nausea and vomiting. Keeps repeating "I need air" over and over again.  Interim history Sodium has decreased some, still 160s.  Patient has UTI. Husband will not allow for TEE.   Assessment & Plan   Acute  Encephalopathy/?Bilateral MCA territory Infarcts -CT head: moderate region of decreased density in left parietal-occipital lobe -MRI brain pending -Neurology consulted and appreciated -Echocardiogram from 12/22: Normal EF -Speech, PT, OT consulted -Speech recommended NPO and alternate means of nutrition, however, unable to place NGtube -Neurology recommended TEE but husband refused -Right carotid doppler: 1-39% ICA stenosis, unable to  image left due to patient noncompliance -Lower ext doppler: negative for DVT or SVT -Blood cultures negative to date -UA: shows 7-10 WBC, small leukocytes -Will obtain CXR  UTI -Urine culture shows GNR -Started on ceftriaxone  Hypernatremia/Hyperchloremia -Likely secondary to dehydration and poor oral intake complicated by diuretics -Lasix held -Patient's fluid was stopped overnight -Will start free water via tube -Continue to monitor BMP  Hypokalemia -Secondary to diurectics -Will replace and continue to monitor -Magnesium 1.8  Weakness -Cannot assess at this time -per brother, patient has been weak and not eating properly for some time  Intertigo Dermatitis -Continue lamisil  Atrial fibrillation -Currently rate controlled -Continue metoprolol and aspirin  Diabetes mellitus, Type 2 -Continue lantus, ISS and CBG monitoring  Chronic kidney disease, stage III -Cr at baseline, continue to monitor BMP  Nutritional status -IR placed tube for feedings -nutrition consulted  Code Status: Full  Family Communication: None at bedside, Spoke with husband via phone  Disposition Plan: Admitted  Time Spent in minutes   30 minutes  Procedures  None  Consults   Neurology Nephrology, via phone Cardiology  DVT Prophylaxis  Lovenox  Lab Results  Component Value Date   PLT 151 05/02/2014    Medications  Scheduled Meds: . antiseptic  oral rinse  7 mL Mouth Rinse BID  . aspirin  325 mg Oral Daily   Or  . aspirin  300 mg Rectal Daily  . enoxaparin (LOVENOX) injection  50 mg Subcutaneous Q24H  . free water  200 mL Per Tube 3 times per day  . Influenza vac split quadrivalent PF  0.5 mL Intramuscular Tomorrow-1000  . insulin aspart  0-9 Units Subcutaneous Q4H  . QUEtiapine  25 mg Oral QHS  . sodium chloride  3 mL Intravenous Q12H  . terbinafine   Topical BID   Continuous Infusions: . feeding supplement (GLUCERNA 1.2 CAL) 1,000 mL (05/01/14 2051)   PRN  Meds:.[DISCONTINUED] acetaminophen **OR** acetaminophen, haloperidol lactate, [DISCONTINUED] ondansetron **OR** ondansetron (ZOFRAN) IV, sodium chloride  Antibiotics    Anti-infectives    None      Subjective:   Jessenia Wetherby seen and examined today.  Patient continues to be confused.  Lethargic this morning.  Objective:   Filed Vitals:   05/02/14 0133 05/02/14 0432 05/02/14 0656 05/02/14 0940  BP: 122/53  134/60 115/68  Pulse: 83  79 56  Temp: 98.5 F (36.9 C)  97.8 F (36.6 C) 99.9 F (37.7 C)  TempSrc: Oral  Oral Oral  Resp: 28  28 28   Height:      Weight:  106.641 kg (235 lb 1.6 oz)    SpO2: 95%  94% 97%    Wt Readings from Last 3 Encounters:  05/02/14 106.641 kg (235 lb 1.6 oz)  04/20/14 114.7 kg (252 lb 13.9 oz)     Intake/Output Summary (Last 24 hours) at 05/02/14 0948 Last data filed at 05/02/14 0220  Gross per 24 hour  Intake     63 ml  Output   1450 ml  Net  -1387 ml    Exam  General: Well developed, well nourished, NAD, appears stated age  HEENT: NCAT, mucous membranes moist.   Cardiovascular: S1 S2 auscultated, irregular  Respiratory: Clear to auscultation bilaterally with equal chest rise, somewhat labored  Abdomen: Soft, obese, nontender, nondistended, + bowel sounds  Extremities: warm dry without cyanosis clubbing.  Trace edema LLE. Mittens  Data Review   Micro Results Recent Results (from the past 240 hour(s))  Clostridium Difficile by PCR     Status: None   Collection Time: 04/23/14  7:07 AM  Result Value Ref Range Status   C difficile by pcr NEGATIVE NEGATIVE Final    Comment: Performed at [facility]  MRSA PCR Screening     Status: None   Collection Time: 04/27/14  7:04 PM  Result Value Ref Range Status   MRSA by PCR NEGATIVE NEGATIVE Final    Comment:        The GeneXpert MRSA Assay (FDA approved for NASAL specimens only), is one component of a comprehensive MRSA colonization surveillance program. It is not intended  to diagnose MRSA infection nor to guide or monitor treatment for MRSA infections.   Culture, blood (routine x 2)     Status: None (Preliminary result)   Collection Time: 04/28/14  9:41 AM  Result Value Ref Range Status   Specimen Description BLOOD RIGHT HAND  Final   Special Requests BOTTLES DRAWN AEROBIC ONLY 2CC  Final   Culture   Final           BLOOD CULTURE RECEIVED NO GROWTH TO DATE CULTURE WILL BE HELD FOR 5 DAYS BEFORE ISSUING A FINAL NEGATIVE REPORT [facility]    Report Status PENDING  Incomplete  Culture, blood (routine x 2)     Status: None (Preliminary result)   Collection Time: 04/28/14  9:50 AM  Result Value Ref Range Status   Specimen Description BLOOD LEFT HAND  Final   Special Requests BOTTLES DRAWN AEROBIC ONLY 1CC  Final   Culture   Final           BLOOD CULTURE RECEIVED NO GROWTH TO DATE CULTURE WILL BE HELD FOR 5 DAYS BEFORE ISSUING A FINAL NEGATIVE REPORT Performed at [facility]    Report Status PENDING  Incomplete  Culture, Urine     Status: None (Preliminary result)   Collection Time: 04/30/14 12:36 PM  Result Value Ref Range Status   Specimen Description URINE, CATHETERIZED  Final   Special Requests NONE  Final   Colony Count   Final    >=100,000 COLONIES/ML Performed at [facility]    Culture   Final    GRAM NEGATIVE RODS Performed at [facility]    Report Status PENDING  Incomplete    Radiology Reports Ct Abdomen Pelvis Wo Contrast  04/18/2014   CLINICAL DATA:  Status post fall; found on floor. Sepsis, with progressive weakness for several weeks. Erythema at the groin and abdominal wall. Leukocytosis. Initial encounter.  EXAM: CT ABDOMEN AND PELVIS WITHOUT CONTRAST  TECHNIQUE: Multidetector CT imaging of the abdomen and pelvis was performed following the standard protocol without IV contrast.  COMPARISON:  Lumbar spine radiograph performed 04/17/2014  FINDINGS: Right lower lobe airspace  opacification is compatible with pneumonia.  The liver and spleen are unremarkable in appearance. Scattered stones are seen within the gallbladder. The gallbladder is otherwise unremarkable. The pancreas and adrenal glands are unremarkable.  Nonspecific perinephric stranding is noted bilaterally. Mild right-sided pelvicaliectasis remains within normal limits. There is no evidence of hydronephrosis. No renal or ureteral stones are seen.  No free fluid is identified. The small bowel is unremarkable in appearance. The stomach is within normal limits. No acute vascular abnormalities are seen. Minimal calcification is seen along the abdominal aorta and its branches.  The appendix is not definitely seen; there is no evidence for appendicitis. The colon is unremarkable in appearance.  A small-to-moderate umbilical hernia is noted, containing only fat.  The bladder is largely decompressed and grossly unremarkable. A large 8.0 cm fibroid is suspected within the uterus. The uterus is mildly enlarged. The ovaries are grossly symmetric. No suspicious adnexal masses are seen. Stop No inguinal lymphadenopathy is seen.  No acute osseous abnormalities are identified.  IMPRESSION: 1. Right lower lobe pneumonia noted; this likely explains the patient's symptoms. 2. Cholelithiasis; gallbladder otherwise unremarkable. 3. Small to moderate umbilical hernia, containing only fat. 4. Mildly enlarged fibroid uterus noted.   Electronically Signed   By: Roanna Raider M.D.   On: 04/18/2014 05:07   Dg Chest 1 View  04/28/2014   CLINICAL DATA:  Fever. History of atrial fibrillation. Patient had a recent hospitalization for sepsis and pneumonia.  EXAM: CHEST - 1 VIEW  COMPARISON:  April 27, 2014  FINDINGS: The heart size and mediastinal contours are stable. The heart size is enlarged. There is mild diffuse increased pulmonary interstitium. There is no focal pneumonia or pleural effusion. The visualized skeletal structures are stable.   IMPRESSION: Mild congestive heart failure.   Electronically Signed   By: Sherian Rein M.D.   On: 04/28/2014 08:38   Dg Chest 2 View  04/17/2014   CLINICAL DATA:  Pt arrived via EMS  with report of falling without injury d/t "legs given out" on concrete garage floor since 1300 and was found by spouse ast 1845. EMS reported that CBG was greater than 600, poor appetite, noncompliant with diet d/t noted soft drinks and sweeten tea in house, strong urine smell, and red rash to abd folds. Pt c/o lower back pain, large bruising to left hip and groin, denies any chest complaints, best obtainable images due to pt size and condition  EXAM: CHEST  2 VIEW  COMPARISON:  None.  FINDINGS: There are low lung volumes with patient rotation to the right. The lateral view is limited. There is elevation of the right hemidiaphragm. The aorta appears ectatic and somewhat unfolded. The heart size is normal. Mild atelectasis is present at the right lung base. There is no consolidation, significant pleural effusion or pneumothorax. No acute osseous findings identified.  IMPRESSION: Elevated right hemidiaphragm with associated right basilar atelectasis. No consolidation or significant edema.   Electronically Signed   By: Roxy Horseman M.D.   On: 04/17/2014 21:36   Dg Lumbar Spine 2-3 Views  04/17/2014   CLINICAL DATA:  Fall.  Weakness.  Initial evaluation.  EXAM: LUMBAR SPINE - 2-3 VIEW  COMPARISON:  None.  FINDINGS: Five lumbar type vertebral bodies are present. There is mild levoscoliosis. Vertebral bodies are otherwise normally aligned with preservation of the normal lumbar lordosis.  There is anterior wedging of the L1 and L2 vertebral bodies, favored to be chronic in nature. Vertebral body heights are otherwise preserved. No definite acute fracture listhesis.  No soft tissue abnormality.  Moderate multilevel degenerative disc disease present within the visualized spine.  IMPRESSION: 1. No definite acute traumatic injury within the  lumbar spine. 2. Anterior wedging of the L1 and L2 vertebral bodies, favored to be chronic in nature. Possible acute compression fractures are not entirely excluded. Correlation with site of pain recommended.   Electronically Signed   By: Rise Mu M.D.   On: 04/17/2014 21:37   Dg Pelvis 1-2 Views  04/17/2014   CLINICAL DATA:  Patient fell on concrete floor earlier in the day  EXAM: PELVIS - 1-2 VIEW  COMPARISON:  None.  FINDINGS: There is no evidence of pelvic fracture or dislocation. 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 No erosive change.  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 No fracture or dislocation.   Electronically Signed   By: Bretta Bang M.D.   On: 04/17/2014 21:37   Dg Tibia/fibula Left  04/17/2014   CLINICAL DATA:  Status post fall; acute onset of left lower leg pain. Initial encounter.  EXAM: LEFT TIBIA AND FIBULA - 2 VIEW  COMPARISON:  None.  FINDINGS: There is no evidence of fracture or dislocation. The tibia and fibula appear intact.  Marginal osteophytes are seen arising at the medial and lateral compartments of the knee, with wall osteophytes also seen. No knee joint effusion is identified.  The ankle mortise is incompletely assessed, but appears grossly unremarkable. A plantar calcaneal spur is incidentally noted. No significant soft tissue abnormalities are identified.  IMPRESSION: No evidence of fracture or dislocation.   Electronically Signed   By: Roanna Raider M.D.   On: 04/17/2014 22:52   Dg Abd 1 View  05/01/2014   CLINICAL DATA:  Feeding tube placement.  EXAM: ABDOMEN - 1 VIEW  COMPARISON:  None.  FINDINGS: A feeding tube is seen in place. Contrast injection shows feeding tube positioned within the distal duodenum near the ligament of Treitz.  IMPRESSION: Feeding tube tip in distal duodenum near ligament of Treitz.    Electronically Signed   By: Myles Rosenthal M.D.   On: 05/01/2014 11:01   Ct Head Wo Contrast  04/27/2014   CLINICAL DATA:  Altered mental status.  EXAM: CT HEAD WITHOUT CONTRAST  TECHNIQUE: Contiguous axial images were obtained from the base of the skull through the vertex without intravenous contrast.  COMPARISON:  None.  FINDINGS: There is ill-defined decreased density involving the moderate portion of the left posterior parietal occipital lobes extending from the lateral ventricle of the cortex. There is an additional questionable region of decreased density in the right temporal lobe versus volume averaging. There is no associated hemorrhage. There is no midline shift. There is mild prominence of the extra-axial CSF space on the right, may be related to atrophy versus subdural hygroma. The calvarium is intact. Included paranasal sinuses and mastoid air cells are well aerated.  IMPRESSION: Moderate region of decreased density in the left parietal-occipital lobe concerning for subacute infarct. There is additional questionable region of decreased density in the right temporal lobe. Given the questioned multifocal involvement, MRI of the brain, preferably with contrast, recommended for further evaluation to exclude the possibility of underlying mass lesions.  These results were called by telephone at the time of interpretation on 04/27/2014 at 3:28 pm to Dr. Pricilla Loveless , who verbally acknowledged these results.   Electronically Signed   By: Rubye Oaks M.D.   On: 04/27/2014 15:31   Dg Chest Port 1 View  04/27/2014   CLINICAL DATA:  Altered mental status.  EXAM: PORTABLE CHEST - 1 VIEW  COMPARISON:  April 17, 2014.  FINDINGS: The heart size and mediastinal contours are within normal limits. Both lungs are clear. No pneumothorax or pleural effusion is noted. The visualized skeletal structures are unremarkable.  IMPRESSION: No acute cardiopulmonary abnormality seen.   Electronically Signed   By: Roque Lias M.D.   On: 04/27/2014 14:43   Dg Vangie Bicker G Tube Plc W/fl-no Rad  05/01/2014   CLINICAL DATA:    NASO G TUBE PLACEMENT WITH FLUORO  Fluoroscopy was utilized by the requesting physician.  No radiographic  interpretation.     CBC  Recent Labs Lab 04/27/14 1504 04/28/14 0634 04/30/14 0429 05/01/14 0635 05/02/14 0410  WBC 18.8* 21.1* 16.8* 13.6* 13.8*  HGB 12.2 12.3 11.6* 10.7* 11.1*  HCT 40.0 37.6 36.8 34.7* 36.5  PLT 181 177 152 122* 151  MCV 95.9 95.2 96.6 95.9 95.8  MCH 29.3 31.1 30.4 29.6 29.1  MCHC 30.5 32.7 31.5 30.8 30.4  RDW 16.7* 16.9* 16.8* 16.7* 16.5*  LYMPHSABS 1.5  --  2.4  --   --   MONOABS 0.8  --  0.9  --   --   EOSABS 0.1  --  0.2  --   --   BASOSABS 0.0  --  0.0  --   --     Chemistries   Recent Labs Lab 04/27/14 1504  04/28/14 1832 04/29/14 0350 04/30/14 0429 05/01/14 0635 05/02/14 0410  NA 166*  < > 169* 167* 163* 161* 162*  K 3.2*  < > 3.0* 3.1* 3.2* 3.0* 3.4*  CL 128*  < > >130* >130* 127* 126* 125*  CO2 29  < > 27 26 27 30 28   GLUCOSE 102*  < > 119*  145* 173* 182* 170*  BUN 17  < > 16 17 17 15 19   CREATININE 1.66*  < > 1.95* 1.96* 1.91* 1.82* 1.83*  CALCIUM 8.7  < > 8.6 8.1* 8.2* 8.1* 8.2*  MG  --   --   --  1.8  --   --   --   AST 33  --   --   --   --   --   --   ALT 17  --   --   --   --   --   --   ALKPHOS 315*  --   --   --   --   --   --   BILITOT 0.7  --   --   --   --   --   --   < > = values in this interval not displayed. ------------------------------------------------------------------------------------------------------------------ estimated creatinine clearance is 33.3 mL/min (by C-G formula based on Cr of 1.83). ------------------------------------------------------------------------------------------------------------------ No results for input(s): HGBA1C in the last 72 hours. ------------------------------------------------------------------------------------------------------------------ No results for input(s): CHOL,  HDL, LDLCALC, TRIG, CHOLHDL, LDLDIRECT in the last 72 hours. ------------------------------------------------------------------------------------------------------------------ No results for input(s): TSH, T4TOTAL, T3FREE, THYROIDAB in the last 72 hours.  Invalid input(s): FREET3 ------------------------------------------------------------------------------------------------------------------ No results for input(s): VITAMINB12, FOLATE, FERRITIN, TIBC, IRON, RETICCTPCT in the last 72 hours.  Coagulation profile No results for input(s): INR, PROTIME in the last 168 hours.  No results for input(s): DDIMER in the last 72 hours.  Cardiac Enzymes  Recent Labs Lab 04/27/14 1504  TROPONINI 0.06*   ------------------------------------------------------------------------------------------------------------------ Invalid input(s): POCBNP    Shanica Castellanos D.O. on 05/02/2014 at 9:48 AM  Between 7am to 7pm - Pager - [phone]  After 7pm go to [email or web address] - password [email or web address]  And look for the night coverage person covering for me after hours  [facility] Office  [phone]

## 2014-05-02 NOTE — Progress Notes (Addendum)
    [facility] has been requested to perform a transesophageal echocardiogram on 01/06 for CVA.  After careful review of history and examination, the risks and benefits of transesophageal echocardiogram have been explained including risks of esophageal damage, perforation (1:10,000 risk), bleeding, pharyngeal hematoma as well as other potential complications associated with conscious sedation including aspiration, arrhythmia, respiratory failure and death. Alternatives to treatment were discussed, questions were answered.   Patient's husband gave verbal permission, witnessed by RN, and is willing to proceed.   Theodore Demarkhonda Aerianna Losey, PA-C 05/02/2014 5:18 PM

## 2014-05-02 NOTE — Progress Notes (Signed)
STROKE TEAM PROGRESS NOTE   HISTORY [name] is a 69 y.o. female with a history of atrial fibrillation and recent hospitalization for sepsis, pneumonia rhabdomyolysis as well as hypernatremia and acute kidney injury, brought to the emergency room at [facility] for increased confusion and speech abnormality. Serum sodium was 169. CT scan of her head showed low density areas involving large area of left MCA territory as well as right temporal abnormality, with appearance indicative of probable subacute infarctions. MRI was recommended but could not be obtained because of patient's agitated state. She was transferred here for further evaluation and management with stroke service intervention.  LSN: Unclear tPA Given: No: CT abnormalities is an on presentation, as well as unclear when last known well. mRankin:  SUBJECTIVE (INTERVAL HISTORY) husband is present today at bedside. The patient's condition was worsened. She is lethargic and not open eyes on voice. More obtunded than yesterday. Intermittent agitation. On TF, will put on free water. Husband agreed for TEE today after long discussion. Na trending down but stable at 162, will put on free water. Had UTI and on rocephine.  OBJECTIVE Temp:  [97.8 F (36.6 C)-99.9 F (37.7 C)] 99.9 F (37.7 C) (01/05 1355) Pulse Rate:  [56-96] 83 (01/05 1355) Cardiac Rhythm:  [-]  Resp:  [24-30] 30 (01/05 1355) BP: (105-135)/(53-83) 105/75 mmHg (01/05 1355) SpO2:  [94 %-100 %] 95 % (01/05 1355) Weight:  [235 lb 1.6 oz (106.641 kg)] 235 lb 1.6 oz (106.641 kg) (01/05 0432)   Recent Labs Lab 05/01/14 2023 05/01/14 2356 05/02/14 0401 05/02/14 0803 05/02/14 1224  GLUCAP 127* 130* 151* 186* 183*    Recent Labs Lab 04/28/14 1832 04/29/14 0350 04/30/14 0429 05/01/14 0635 05/02/14 0410  NA 169* 167* 163* 161* 162*  K 3.0* 3.1* 3.2* 3.0* 3.4*  CL >130* >130* 127* 126* 125*  CO2 27 26 27 30 28   GLUCOSE 119* 145* 173* 182* 170*  BUN 16  17 17 15 19   CREATININE 1.95* 1.96* 1.91* 1.82* 1.83*  CALCIUM 8.6 8.1* 8.2* 8.1* 8.2*  MG  --  1.8  --   --   --     Recent Labs Lab 04/27/14 1504  AST 33  ALT 17  ALKPHOS 315*  BILITOT 0.7  PROT 6.9  ALBUMIN 2.5*    Recent Labs Lab 04/27/14 1504 04/28/14 0634 04/30/14 0429 05/01/14 0635 05/02/14 0410  WBC 18.8* 21.1* 16.8* 13.6* 13.8*  NEUTROABS 16.3*  --  13.3*  --   --   HGB 12.2 12.3 11.6* 10.7* 11.1*  HCT 40.0 37.6 36.8 34.7* 36.5  MCV 95.9 95.2 96.6 95.9 95.8  PLT 181 177 152 122* 151    Recent Labs Lab 04/27/14 1504  CKTOTAL 43  TROPONINI 0.06*   No results for input(s): LABPROT, INR in the last 72 hours.  Recent Labs  04/30/14 1236  COLORURINE AMBER*  LABSPEC 1.011  PHURINE 5.5  GLUCOSEU NEGATIVE  HGBUR LARGE*  BILIRUBINUR NEGATIVE  KETONESUR 15*  PROTEINUR 100*  UROBILINOGEN 1.0  NITRITE POSITIVE*  LEUKOCYTESUR LARGE*       Component Value Date/Time   CHOL 101 04/29/2014 0350   TRIG 182* 04/29/2014 0350   HDL 26* 04/29/2014 0350   CHOLHDL 3.9 04/29/2014 0350   VLDL 36 04/29/2014 0350   LDLCALC 39 04/29/2014 0350   Lab Results  Component Value Date   HGBA1C 12.1* 04/29/2014      Component Value Date/Time   LABOPIA POSITIVE* 04/18/2014 1754   COCAINSCRNUR  NONE DETECTED 04/18/2014 1754   LABBENZ NONE DETECTED 04/18/2014 1754   AMPHETMU NONE DETECTED 04/18/2014 1754   THCU NONE DETECTED 04/18/2014 1754   LABBARB NONE DETECTED 04/18/2014 1754    No results for input(s): ETH in the last 168 hours.  I have personally reviewed the radiological images below and agree with the radiology interpretations.  Ct Head Wo Contrast 04/27/2014    Moderate region of decreased density in the left parietal-occipital lobe concerning for subacute infarct. There is additional questionable region of decreased density in the right temporal lobe. Given the questioned multifocal involvement, MRI of the brain, preferably with contrast, recommended for  further evaluation to exclude the possibility of underlying mass lesions.    MRI not able to be done due to agitation  Dg Chest [facility] 04/27/2014    No acute cardiopulmonary abnormality seen.  DG Chest 1 View 04/28/2014  Mild congestive heart failure 05/02/2014 No active cardiopulmonary disease.  2D echo 04/18/14 - Left ventricle: The cavity size was normal. Wall thickness was normal. Systolic function was normal. The estimated ejection fraction was in the range of 55% to 60%. - Left atrium: The atrium was mildly dilated. Impressions: - Overall very poor image quality.  CUS -  1-39 right ICA stenosis. Unable to image left side secondary to confusion and non compliance.   Venous doppler - There is no obvious evidence of DVT or SVT noted in the bilateral lower extremities.   EEG -  1) Asymmetric PDR. 2) irregular delta activity.  Clinical Interpretation: This EEG is most consistent with a generalized cerebral dysfunction with a superimposed area of cortical dysfunction as evidenced by attenuated voltages in the left posterior quadrant. There was no seizure or seizure predisposition recorded on this study.    PHYSICAL EXAM  Temp:  [97.8 F (36.6 C)-99.9 F (37.7 C)] 99.9 F (37.7 C) (01/05 1355) Pulse Rate:  [56-96] 83 (01/05 1355) Resp:  [24-30] 30 (01/05 1355) BP: (105-135)/(53-83) 105/75 mmHg (01/05 1355) SpO2:  [94 %-100 %] 95 % (01/05 1355) Weight:  [235 lb 1.6 oz (106.641 kg)] 235 lb 1.6 oz (106.641 kg) (01/05 0432)  General - morbid obesity, well developed, intermittent restless in bed, eyes not open on stimulation.  Ophthalmologic - not able to test due to agitation.  Cardiovascular - Irregular rate and rhythm with no murmur.  Neuro - obtunded, not open eyes on voice or stimulation, intermittent restless and agitated, not following commands, global aphasia, not able to name or repeat. No facial asymmetry. PERRL, EOMI, move all extremities spontaneously  and equally. Reflex 1+ and no babinski.  ASSESSMENT/PLAN [name] is a 69 y.o. female with history of atrial fibrillation, diabetes mellitus, recent sepsis, presenting with confusion and speech abnormalities . She did not receive IV t-PA due to unknown time of onset.  Stroke: Bilateral MCA embolic infarcts - etiology not clear but likely due to atrial fibrillation or endocarditis due to sepsis.  Resultant  confusion/global aphasia  MRI (not yet performed secondary to agitation) - consider MRI once agitation resolved.  Carotid Doppler - 1-39 right ICA stenosis. will repeat CUS on the left today.  2D Echo EF 55-60%. Poor images. No cardiac source of emboli identified.  Schedule TEE in am  LE doppler - negative for DVT  LDL 39  HgbA1c 12.1, not at goal  Lovenox for VTE prophylaxis   Diet NPO time specified no liquids, on tube feeding  aspirin 81 mg orally every day prior to  admission, now on aspirin 300 mg suppository daily or ASA  via PANDA  Ongoing aggressive stroke risk factor management  Therapy recommendations:  Pending  Disposition:  Pending  AMS - Could be due to hypernatremia - likely due to dehydration. Na 162 today.  - start free water Q4h  - UTI with encephalopathy has to be considered - EEG no seizure - repeat CT stat to evaluate cerebral edema.  Hx of Afib - on documentation - family stated that they have not being told about hx of Afib - if comfirmed, she need life long anticoagulation - continue tele - multiple runs of probable MAT in the 140-160 range  - may consider cardiology consult  Recent sepsis - treated in [facility]  - 04/28/14 blood culture NGTD - Leukocytosis - WBC's Friday 21.1->16.8->13.6 - TEE to rule out endocarditis  Diabetes  HgbA1c 12.1 goal < 7.0  Uncontrolled  SSI  Close monitoring  Hypertension  Home meds:  Lopressor 50 mg twice a day  Stable  Hyperlipidemia  Home meds:  No lipid lowering medications  prior to admission.  LDL 39, goal < 70  Nutrition  S/p PANDA   On tube feeding  On free water  Other Stroke Risk Factors  Advanced age  Obesity, Body mass index is 42.99 kg/(m^2).   Other Active Problems  Hypokalemia - 3.1 today  Elevated creatinine  Leukocytosis - blood and sputum cultures NGTD   Mild congestive heart failure by chest x-ray 04/28/2014  Other Pertinent History  No regular medical follow-up for 17 years.   Hospital day # 5  This patient is critically ill due to AMS, left MCA stroke, hypernatremia and at significant risk of neurological worsening. I have personally obtained an additional history from husband, examined the patient, evaluated laboratory data, individually viewed imaging studies and agree with radiology interpretations. I also discussed with Dr. Catha Gosselin regarding his care plan. Husband has agreed with TEE. Will do TEE and CUS for stroke w/u. Continue treating for UTI and supportive care.   Marvel Plan, MD PhD Stroke Neurology 05/02/2014 3:08 PM    To contact Stroke Continuity provider, please refer to [email or web address]. After hours, contact General Neurology

## 2014-05-02 NOTE — Progress Notes (Signed)
ANTIBIOTIC CONSULT NOTE - INITIAL  Pharmacy Consult for Rocephin Indication: GNR UTI  Allergies  Allergen Reactions  . Morphine And Related    Patient Measurements: Height: 5\' 2"  (157.5 cm) Weight: 235 lb 1.6 oz (106.641 kg) IBW/kg (Calculated) : 50.1 Vital Signs: Temp: 99.9 F (37.7 C) (01/05 0940) Temp Source: Oral (01/05 0940) BP: 115/68 mmHg (01/05 0940) Pulse Rate: 56 (01/05 0940) Intake/Output from previous day: 01/04 0701 - 01/05 0700 In: 63 [I.V.:3; NG/GT:60] Out: 3125 [Urine:3125] Intake/Output from this shift:    Labs:  Recent Labs  04/30/14 0429 05/01/14 0635 05/02/14 0410  WBC 16.8* 13.6* 13.8*  HGB 11.6* 10.7* 11.1*  PLT 152 122* 151  CREATININE 1.91* 1.82* 1.83*   Estimated Creatinine Clearance: 33.3 mL/min (by C-G formula based on Cr of 1.83). No results for input(s): VANCOTROUGH, VANCOPEAK, VANCORANDOM, GENTTROUGH, GENTPEAK, GENTRANDOM, TOBRATROUGH, TOBRAPEAK, TOBRARND, AMIKACINPEAK, AMIKACINTROU, AMIKACIN in the last 72 hours.   Microbiology: Recent Results (from the past 720 hour(s))  Urine culture     Status: None   Collection Time: 04/17/14  8:47 PM  Result Value Ref Range Status   Specimen Description URINE, CATHETERIZED  Final   Special Requests Normal  Final   Culture  Setup Time   Final    04/18/2014 04:23 Performed at [facility]    Colony Count NO GROWTH Performed at [facility]   Final   Culture NO GROWTH Performed at [facility]   Final   Report Status 04/19/2014 FINAL  Final  MRSA PCR Screening     Status: None   Collection Time: 04/18/14  3:55 AM  Result Value Ref Range Status   MRSA by PCR NEGATIVE NEGATIVE Final    Comment:        The GeneXpert MRSA Assay (FDA approved for NASAL specimens only), is one component of a comprehensive MRSA colonization surveillance program. It is not intended to diagnose MRSA infection nor to guide or monitor treatment for MRSA infections.   Clostridium  Difficile by PCR     Status: None   Collection Time: 04/23/14  7:07 AM  Result Value Ref Range Status   C difficile by pcr NEGATIVE NEGATIVE Final    Comment: Performed at [facility]   MRSA PCR Screening     Status: None   Collection Time: 04/27/14  7:04 PM  Result Value Ref Range Status   MRSA by PCR NEGATIVE NEGATIVE Final    Comment:        The GeneXpert MRSA Assay (FDA approved for NASAL specimens only), is one component of a comprehensive MRSA colonization surveillance program. It is not intended to diagnose MRSA infection nor to guide or monitor treatment for MRSA infections.   Culture, blood (routine x 2)     Status: None (Preliminary result)   Collection Time: 04/28/14  9:41 AM  Result Value Ref Range Status   Specimen Description BLOOD RIGHT HAND  Final   Special Requests BOTTLES DRAWN AEROBIC ONLY 2CC  Final   Culture   Final           BLOOD CULTURE RECEIVED NO GROWTH TO DATE CULTURE WILL BE HELD FOR 5 DAYS BEFORE ISSUING A FINAL NEGATIVE REPORT Performed at [facility]    Report Status PENDING  Incomplete  Culture, blood (routine x 2)     Status: None (Preliminary result)   Collection Time: 04/28/14  9:50 AM  Result Value Ref Range Status   Specimen Description BLOOD LEFT HAND  Final   Special Requests BOTTLES DRAWN AEROBIC ONLY 1CC  Final   Culture   Final           BLOOD CULTURE RECEIVED NO GROWTH TO DATE CULTURE WILL BE HELD FOR 5 DAYS BEFORE ISSUING A FINAL NEGATIVE REPORT Performed at [facility]    Report Status PENDING  Incomplete  Culture, Urine     Status: None (Preliminary result)   Collection Time: 04/30/14 12:36 PM  Result Value Ref Range Status   Specimen Description URINE, CATHETERIZED  Final   Special Requests NONE  Final   Colony Count   Final    >=100,000 COLONIES/ML Performed at [facility]    Culture   Final    GRAM NEGATIVE RODS Performed at [facility]    Report Status PENDING  Incomplete     Medical History: Past Medical History  Diagnosis Date  . Thyroid disease   . Rhabdomyolysis 04/18/2014  . Hyperosmolar non-ketotic state in patient with type 2 diabetes mellitus 04/17/2014  . AKI (acute kidney injury) 04/18/2014  . Sepsis 04/18/2014  . Atrial fibrillation 04/27/2014  . Diabetes mellitus, type 2 04/27/2014    Medications:  Anti-infectives    None     Assessment: 69 year old female with GNR in urine culture to start Rocephin per pharmacy dosing. WBC is elevated at 13.8. Blood cultures are no growth to date. Tmax 99.9. Weight 107 kg.   Goal of Therapy:  Clinical resolution of infection  Plan:  Rocephin 2g IV q24h.  Pharmacy will sign off as no renal adjustment is necessary.   Link Snuffer, PharmD, BCPS Clinical Pharmacist [phone] 05/02/2014,10:01 AM

## 2014-05-02 NOTE — Progress Notes (Signed)
Chaplain initiated visit with pt and family after referral from nurse and CSW. Chaplain offered prayer and emotional support. Pt husband told chaplain that the next step was to check her heart for any connection to her stroke. Chaplain will continue to follow. Chaplain informed pt husband of her presence should he need it. Page chaplain if needed before follow up visit.   05/02/14 1500  Clinical Encounter Type  Visited With Patient and family together;Health care provider  Visit Type Initial;Spiritual support  Referral From Nurse  Spiritual Encounters  Spiritual Needs Emotional;Prayer  Stress Factors  Family Stress Factors Lack of knowledge;Health changes  Jye Fariss, Loa SocksCourtney F, Chaplain 05/02/2014 3:19 PM

## 2014-05-02 NOTE — Evaluation (Signed)
Physical Therapy Evaluation Patient Details Name: [name] C Moss MRN: [id number] DOB: 06/27/1945 Today's Date: 05/02/2014   History of Present Illness  69 y.o. female with a PMH of atrial fibillation, recently hospitalized 04/17/14-04/25/14 for treatment of sepsis, pneumonia, rhabdomyolysis, and demand ischemia from afib w/ RVR.  She was also hypernatremic at discharge with a sodium of 154 and a creatinine of 1.74.  She was discharged on lasix.  According to notes, she was back to her normal baseline mental status at discharge. Admitted with high sodium levels and family reporting increased weakness. MRI unable to be done due to agitation. Question infarcts.    Clinical Impression  Evaluation very limited due to lethargic status. Pt unable to follow commands or functionally participate in skilled PT intervention despite multi-modal attempts. Currently using lift equipment for OOB to promote upright tolerance and endurance. Will benefit from skilled PT intervention to address below listed impairments to increase functional mobility, strength, and endurance. Plan to d/c to SNF.    Follow Up Recommendations SNF;Supervision/Assistance - 24 hour    Equipment Recommendations  None recommended by PT (Defer to next venue of care)    Recommendations for Other Services       Precautions / Restrictions Precautions Precautions: Fall Precaution Comments: NG tube Restrictions Weight Bearing Restrictions: No      Mobility  Bed Mobility Overal bed mobility: +2 for physical assistance Bed Mobility: Rolling Rolling: +2 for physical assistance;Total assist         General bed mobility comments: Pt at times appears to initiate rolling to L bringing LE's towards EOB and reaching toward rail but unable to follow commands to perform activity for supine to sit. Total A +2 for all mobility in the bed. Changed pad, performed hygiene and rolling for donning sling under pt.  Transfers Overall transfer level:  Needs assistance (Lift equipment (maxi move for OOB)) Equipment used:  (Maximove)             General transfer comment: For safety due to lethargy, used lift equipment for OOB  Ambulation/Gait             General Gait Details: unable  Stairs            Wheelchair Mobility    Modified Rankin (Stroke Patients Only)       Balance Overall balance assessment: Needs assistance   Sitting balance-Leahy Scale: Zero Sitting balance - Comments: Unable to get pt to seated position EOB. While in lift sling, initiated forward movement but otherwise limited assessment                                     Pertinent Vitals/Pain Pain Assessment:  (Did not report pain or grimace with movement.)    Home Living Family/patient expects to be discharged to:: Skilled nursing facility (Pt unable to state. Family not present)                 Additional Comments: Pt extremely lethargic and unable to provide any information.    Prior Function           Comments: Per report and information in chart, this condition has been going for several months.     Hand Dominance        Extremity/Trunk Assessment   Upper Extremity Assessment: Defer to OT evaluation           Lower Extremity Assessment: Generalized weakness (  pt moving extremities(R >L)intermittently;difficult to asses)      Cervical / Trunk Assessment:  (body habitus limiting and unable to get into seated position)  Communication   Communication: Other (comment) (too lethargic to communitcate during eval)  Cognition Arousal/Alertness: Lethargic Behavior During Therapy: Restless Overall Cognitive Status: Impaired/Different from baseline Area of Impairment: Orientation;Attention;Following commands;Memory;Safety/judgement;Awareness (Extrememly lethargic during evaluation. )               General Comments: Pt unable to follow commands. Staff reports pt is oriented to self only.      General Comments General comments (skin integrity, edema, etc.): Pt very lethargic during evaluation. Inconsistent movement of BLE and BUE with mobility attempts but unable to follow commands or functionally participate in this session. Will need further assessment as able to participate more.     Exercises        Assessment/Plan    PT Assessment Patient needs continued PT services  PT Diagnosis Difficulty walking;Generalized weakness;Altered mental status   PT Problem List Decreased strength;Decreased range of motion;Decreased activity tolerance;Decreased balance;Decreased mobility;Decreased coordination;Decreased cognition;Decreased knowledge of use of DME;Decreased safety awareness;Cardiopulmonary status limiting activity;Obesity;Decreased skin integrity;Pain  PT Treatment Interventions DME instruction;Gait training;Functional mobility training;Therapeutic activities;Therapeutic exercise;Balance training;Neuromuscular re-education;Cognitive remediation;Patient/family education;Wheelchair mobility training;Manual techniques   PT Goals (Current goals can be found in the Care Plan section) Acute Rehab PT Goals Patient Stated Goal: none stated PT Goal Formulation: Patient unable to participate in goal setting Time For Goal Achievement: 05/16/14 Potential to Achieve Goals: Fair    Frequency Min 3X/week   Barriers to discharge  (plan for SNF)      Co-evaluation               End of Session Equipment Utilized During Treatment: Other (comment) (lift equipment) Activity Tolerance: Patient limited by lethargy Patient left: in chair;with nursing/sitter in room;Other (comment) (SLP to see pt) Nurse Communication: Mobility status;Need for lift equipment         Time: 0820-0850 PT Time Calculation (min) (ACUTE ONLY): 30 min   Charges:   PT Evaluation $Initial PT Evaluation Tier I: 1 Procedure PT Treatments $Therapeutic Activity: 8-22 mins   PT G Codes:        Tedd Sias 05/02/2014, 9:12 AM

## 2014-05-02 NOTE — Progress Notes (Signed)
Notified on call about critical lab value for sodium of 162 for this morning. Will continue to monitor.

## 2014-05-02 NOTE — Progress Notes (Signed)
Spoke to PT and ST.  Pt so lethargic that she was unable to participate.  Will defer this eval until tomorrow. Natalie EmeraldHolly Aissata [place], [place] [phone]

## 2014-05-02 NOTE — Progress Notes (Signed)
Unable to perform NIH due to pt's inability to respond to commands.   Will monitor pt closely   [name], [name] I 05/02/2014 7:27 PM

## 2014-05-03 ENCOUNTER — Inpatient Hospital Stay (HOSPITAL_COMMUNITY): Payer: Medicare Other

## 2014-05-03 ENCOUNTER — Encounter (HOSPITAL_COMMUNITY)
Admission: EM | Disposition: A | Discharge status: Nursing Home (Includes ICF) | Payer: Self-pay | Source: Home / Self Care | Attending: Pulmonary Disease

## 2014-05-03 LAB — BLOOD GAS, ARTERIAL
Acid-Base Excess: 5 mmol/L — ABNORMAL HIGH (ref 0.0–2.0)
BICARBONATE: 29.1 meq/L — AB (ref 20.0–24.0)
Drawn by: 419341
O2 CONTENT: 2 L/min
O2 Saturation: 88.6 %
PO2 ART: 58 mmHg — AB (ref 80.0–100.0)
Patient temperature: 98.6
TCO2: 30.4 mmol/L (ref 0–100)
pCO2 arterial: 43.4 mmHg (ref 35.0–45.0)
pH, Arterial: 7.441 (ref 7.350–7.450)

## 2014-05-03 LAB — GLUCOSE, CAPILLARY
GLUCOSE-CAPILLARY: 164 mg/dL — AB (ref 70–99)
Glucose-Capillary: 160 mg/dL — ABNORMAL HIGH (ref 70–99)
Glucose-Capillary: 162 mg/dL — ABNORMAL HIGH (ref 70–99)
Glucose-Capillary: 164 mg/dL — ABNORMAL HIGH (ref 70–99)
Glucose-Capillary: 168 mg/dL — ABNORMAL HIGH (ref 70–99)
Glucose-Capillary: 173 mg/dL — ABNORMAL HIGH (ref 70–99)
Glucose-Capillary: 194 mg/dL — ABNORMAL HIGH (ref 70–99)

## 2014-05-03 LAB — BASIC METABOLIC PANEL
BUN: 31 mg/dL — AB (ref 6–23)
BUN: 37 mg/dL — ABNORMAL HIGH (ref 6–23)
CALCIUM: 8.4 mg/dL (ref 8.4–10.5)
CO2: 25 mmol/L (ref 19–32)
CO2: 28 mmol/L (ref 19–32)
CREATININE: 2.16 mg/dL — AB (ref 0.50–1.10)
Calcium: 8.2 mg/dL — ABNORMAL LOW (ref 8.4–10.5)
Creatinine, Ser: 2.3 mg/dL — ABNORMAL HIGH (ref 0.50–1.10)
GFR calc Af Amer: 26 mL/min — ABNORMAL LOW (ref >90)
GFR calc Af Amer: 24 mL/min — ABNORMAL LOW (ref >90)
GFR calc non Af Amer: 22 mL/min — ABNORMAL LOW (ref >90)
GFR calc non Af Amer: 21 mL/min — ABNORMAL LOW (ref >90)
Glucose, Bld: 196 mg/dL — ABNORMAL HIGH (ref 70–99)
Glucose, Bld: 180 mg/dL — ABNORMAL HIGH (ref 70–99)
POTASSIUM: 4.5 mmol/L (ref 3.5–5.1)
Potassium: 3.3 mmol/L — ABNORMAL LOW (ref 3.5–5.1)
Sodium: 164 mmol/L (ref 135–145)
Sodium: 166 mmol/L (ref 135–145)

## 2014-05-03 LAB — CBC
HEMATOCRIT: 38.8 % (ref 36.0–46.0)
HEMOGLOBIN: 11.9 g/dL — AB (ref 12.0–15.0)
MCH: 29.7 pg (ref 26.0–34.0)
MCHC: 30.7 g/dL (ref 30.0–36.0)
MCV: 96.8 fL (ref 78.0–100.0)
Platelets: 174 10*3/uL (ref 150–400)
RBC: 4.01 MIL/uL (ref 3.87–5.11)
RDW: 16.9 % — ABNORMAL HIGH (ref 11.5–15.5)
WBC: 13.5 10*3/uL — AB (ref 4.0–10.5)

## 2014-05-03 LAB — MRSA PCR SCREENING: MRSA by PCR: NEGATIVE

## 2014-05-03 SURGERY — ECHOCARDIOGRAM, TRANSESOPHAGEAL
Anesthesia: Moderate Sedation

## 2014-05-03 MED ORDER — FREE WATER
300.0000 mL | Status: DC
  Administered 2014-05-03 – 2014-05-09 (×47): 300 mL

## 2014-05-03 MED ORDER — SODIUM CHLORIDE 0.9 % IV SOLN
1500.0000 mg | INTRAVENOUS | Status: DC
  Administered 2014-05-05 – 2014-05-07 (×2): 1500 mg via INTRAVENOUS
  Filled 2014-05-03 (×2): qty 1500

## 2014-05-03 MED ORDER — PIPERACILLIN-TAZOBACTAM 3.375 G IVPB 30 MIN
3.3750 g | Freq: Once | INTRAVENOUS | Status: AC
  Administered 2014-05-03: 3.375 g via INTRAVENOUS
  Filled 2014-05-03: qty 50

## 2014-05-03 MED ORDER — FREE WATER: 250.0000 mL | Status: DC

## 2014-05-03 MED ORDER — DEXTROSE 5 % IV SOLN: INTRAVENOUS | Status: DC

## 2014-05-03 MED ORDER — PIPERACILLIN-TAZOBACTAM 3.375 G IVPB
3.3750 g | Freq: Three times a day (TID) | INTRAVENOUS | Status: DC
  Administered 2014-05-03 – 2014-05-05 (×5): 3.375 g via INTRAVENOUS
  Filled 2014-05-03 (×7): qty 50

## 2014-05-03 MED ORDER — ENOXAPARIN SODIUM 40 MG/0.4ML ~~LOC~~ SOLN
40.0000 mg | SUBCUTANEOUS | Status: DC
  Administered 2014-05-03: 40 mg via SUBCUTANEOUS
  Filled 2014-05-03 (×2): qty 0.4

## 2014-05-03 MED ORDER — SODIUM CHLORIDE 0.45 % IV SOLN: INTRAVENOUS | Status: DC

## 2014-05-03 MED ORDER — VANCOMYCIN HCL 10 G IV SOLR
2000.0000 mg | Freq: Once | INTRAVENOUS | Status: AC
  Administered 2014-05-03: 2000 mg via INTRAVENOUS
  Filled 2014-05-03: qty 2000

## 2014-05-03 NOTE — CV Procedure (Signed)
Given current events patient is not a candidate for TEE today Procedure has been cancelled  [employer]Peter Ulys Favia

## 2014-05-03 NOTE — Progress Notes (Signed)
Left carotid artery duplex completed:  1-39% ICA stenosis.  Vertebral artery flow is antegrade.

## 2014-05-03 NOTE — Progress Notes (Signed)
SLP Cancellation Note  Patient Details Name: [name] C Fancher MRN: [id number] DOB: 03/30/1946   Cancelled treatment:        Rapid response called yesterday for increased lethargy. Currently pt in bed, sleeping with decreased arousal per RN. Hold therapy for today.   Royce MacadamiaLitaker, Ugo Thoma Willis 05/03/2014, 9:22 AM  Breck CoonsLisa Willis Lonell FaceLitaker M.Ed [employer]SL[employer] Pager [phone]

## 2014-05-03 NOTE — Progress Notes (Signed)
ANTIBIOTIC CONSULT NOTE - INITIAL  Pharmacy Consult for vancomycin, Zosyn Indication: rule out sepsis  Allergies  Allergen Reactions  . Morphine And Related     Patient Measurements: Height:  (162.6 cm) Weight: 223 lb 12.3 oz (101.5 kg) IBW/kg (Calculated) : 54.7 Vital Signs: Temp: 102.4 F (39.1 C) (01/06 0710) Temp Source: Axillary (01/06 0637) BP: 136/84 mmHg (01/06 1000) Pulse Rate: 101 (01/06 1000) Intake/Output from previous day: 01/05 0701 - 01/06 0700 In: -  Out: 700 [Urine:700] Intake/Output from this shift: Total I/O In: 300 [NG/GT:300] Out: -   Labs:  Recent Labs  05/01/14 0635 05/02/14 0410 05/03/14 0401  WBC 13.6* 13.8* 13.5*  HGB 10.7* 11.1* 11.9*  PLT 122* 151 174  CREATININE 1.82* 1.83* 2.16*   Estimated Creatinine Clearance: 28.5 mL/min (by C-G formula based on Cr of 2.16). No results for input(s): VANCOTROUGH, VANCOPEAK, VANCORANDOM, GENTTROUGH, GENTPEAK, GENTRANDOM, TOBRATROUGH, TOBRAPEAK, TOBRARND, AMIKACINPEAK, AMIKACINTROU, AMIKACIN in the last 72 hours.   Microbiology: Recent Results (from the past 720 hour(s))  Culture, blood (routine x 2)     Status: None   Collection Time: 04/17/14  7:59 PM  Result Value Ref Range Status   Specimen Description BLOOD RIGHT ANTECUBITAL  Final   Special Requests BOTTLES DRAWN AEROBIC AND ANAEROBIC  Final   Culture  Setup Time   Final    04/18/2014 02:56 Performed at [facility]    Culture   Final    NO GROWTH 5 DAYS Performed at [facility]    Report Status 05/02/2014 FINAL  Final  Culture, blood (routine x 2)     Status: None   Collection Time: 04/17/14  8:33 PM  Result Value Ref Range Status   Specimen Description BLOOD R HAND  Final   Special Requests BOTTLES DRAWN AEROBIC AND ANAEROBIC  Final   Culture  Setup Time   Final    04/18/2014 02:58 Performed at [facility]    Culture   Final    NO GROWTH 5 DAYS Performed at [facility]    Report Status 05/02/2014 FINAL  Final  Urine culture     Status: None   Collection Time: 04/17/14  8:47 PM  Result Value Ref Range Status   Specimen Description URINE, CATHETERIZED  Final   Special Requests Normal  Final   Culture  Setup Time   Final    04/18/2014 04:23 Performed at [facility]    Colony Count NO GROWTH Performed at [facility]   Final   Culture NO GROWTH Performed at [facility]   Final   Report Status 04/19/2014 FINAL  Final  MRSA PCR Screening     Status: None   Collection Time: 04/18/14  3:55 AM  Result Value Ref Range Status   MRSA by PCR NEGATIVE NEGATIVE Final    Comment:        The GeneXpert MRSA Assay (FDA approved for NASAL specimens only), is one component of a comprehensive MRSA colonization surveillance program. It is not intended to diagnose MRSA infection nor to guide or monitor treatment for MRSA infections.   Clostridium Difficile by PCR     Status: None   Collection Time: 04/23/14  7:07 AM  Result Value Ref Range Status   C difficile by pcr NEGATIVE NEGATIVE Final    Comment: Performed at [facility]  MRSA PCR Screening     Status: None   Collection Time: 04/27/14  7:04 PM  Result Value Ref Range Status   MRSA by PCR NEGATIVE NEGATIVE Final    Comment:        The GeneXpert MRSA Assay (FDA approved for NASAL specimens only), is one component of a comprehensive MRSA colonization surveillance program. It is not intended to diagnose MRSA infection nor to guide or monitor treatment for MRSA infections.   Culture, blood (routine x 2)     Status: None (Preliminary result)   Collection Time: 04/28/14  9:41 AM  Result Value Ref Range Status   Specimen Description BLOOD RIGHT HAND  Final   Special Requests BOTTLES DRAWN AEROBIC ONLY 2CC  Final   Culture   Final           BLOOD CULTURE RECEIVED NO GROWTH TO DATE CULTURE WILL BE HELD FOR 5 DAYS BEFORE ISSUING A FINAL NEGATIVE REPORT [facility]    Report Status PENDING  Incomplete  Culture, blood (routine x 2)     Status: None (Preliminary result)   Collection Time: 04/28/14  9:50 AM  Result Value Ref Range Status   Specimen Description BLOOD LEFT HAND  Final   Special Requests BOTTLES DRAWN AEROBIC ONLY 1CC  Final   Culture   Final           BLOOD CULTURE RECEIVED NO GROWTH TO DATE CULTURE WILL BE HELD FOR 5 DAYS BEFORE ISSUING A FINAL NEGATIVE REPORT [facility]    Report Status PENDING  Incomplete  Culture, Urine     Status: None   Collection Time: 04/30/14 12:36 PM  Result Value Ref Range Status   Specimen Description URINE, CATHETERIZED  Final   Special Requests NONE  Final   Colony Count   Final    >=100,000 COLONIES/ML Performed at [facility]    Culture   Final    ESCHERICHIA COLI Performed at [facility]    Report Status 05/02/2014 FINAL  Final   Organism ID, Bacteria ESCHERICHIA COLI  Final      Susceptibility   Escherichia coli - MIC*    AMPICILLIN >=32 RESISTANT Resistant     CEFAZOLIN <=4 SENSITIVE Sensitive     CEFTRIAXONE <=1 SENSITIVE Sensitive     CIPROFLOXACIN >=4 RESISTANT Resistant     GENTAMICIN <=1 SENSITIVE Sensitive     LEVOFLOXACIN >=8 RESISTANT Resistant     NITROFURANTOIN <=16 SENSITIVE Sensitive     TOBRAMYCIN <=1 SENSITIVE Sensitive     TRIMETH/SULFA >=320 RESISTANT Resistant     PIP/TAZO <=4 SENSITIVE Sensitive     * ESCHERICHIA COLI    Medical History: Past Medical History  Diagnosis Date  . Thyroid disease   . Rhabdomyolysis 04/18/2014  . Hyperosmolar non-ketotic state in patient with type 2 diabetes mellitus 04/17/2014  . AKI (acute kidney injury) 04/18/2014  . Sepsis 04/18/2014  . Atrial fibrillation 04/27/2014  . Diabetes mellitus, type 2 04/27/2014    Medications:  Anti-infectives    Start     Dose/Rate Route Frequency Ordered Stop   05/02/14 1015  cefTRIAXone (ROCEPHIN) 2 g in dextrose 5 % 50 mL IVPB -  [facility]  Status:  Discontinued     2 g100 mL/hr over 30 Minutes Intravenous Every 24 hours 05/02/14 1006 05/03/14 1023     Assessment: 69 year old female admitted 04/27/14 with for deteriorating mental status, hypernatremia and elevated SCr after recent admission from 04/17/14 to 04/25/14 for sepsis/afib/rhabdo. Rapid response called 1/5 PM due to increased lethargy and patient  is tachy with new fever. Pharmacy consulted to change Rocephin to Vancomycin and Zosyn for r/o sepsis.   WBC 13.5 (stable), new fever up to 102.4, SCr rising (2.16 currently) with estimated CrCl ~ 25-30 mL/min.   CTX  Vanc 1/6 >> Zosyn 1/6 >>  1/6 Blood >> 1/3 Urine >> Ecoli (R-Amp, Cipro, Levo, Bactrim; S-Ancef/CTX, gent, ntf, zosyn, tobra) 1/1 Blood >> ngtd  Goal of Therapy:  Vancomycin trough level 15-20 mcg/ml  Plan:  Zosyn 3.375g IV now over 30 minutes, then 3.375g IV every 8 hours- each dose over 4 hours.  Vancomycin 2000 mg IV now, then 1500 mg IV every 48 hours.  Monitor renal function closely as rising and may require quick adjustments.  Monitor culture results and clinical status.   Link Snuffer, PharmD, BCPS Clinical Pharmacist [phone] 05/03/2014,10:29 AM

## 2014-05-03 NOTE — Progress Notes (Signed)
[facility]                                                                              Patient Demographics  [name], is a 69 y.o. female, DOB - Oct 14, 1945, ZOX:[id number]  Admit date - 04/27/2014   Admitting Physician Maryruth Bun Rama, MD  Outpatient Primary MD for the patient is No primary care provider on file.  LOS - 6   Chief Complaint  Patient presents with  . Altered Mental Status      Admission history of present illness/brief summary: [name] is an 69 y.o. female with a PMH of atrial fibillation, recently hospitalized 04/17/14-04/25/14 for treatment of sepsis, pneumonia, rhabdomyolysis, and demand ischemia from afib w/ RVR. She was also hypernatremic at discharge with a sodium of 154 and a creatinine of 1.74. She was discharged on lasix. According to notes, she was back to her normal baseline mental status at discharge. She had routine labs done, for hospital follow up of electrolytes, and her sodium had increased to 169. Her creatinine is 1.66. Her mental status has deteriorated and she has been weak , according to family, who visited her  at her facility to have lunch with her. The patient is very confused and unable to provide any additional information. Denies pain, shortness of breath and cough. Denies nausea and vomiting. Keeps repeating  Patient had CT head done which did show evidence of CVA, MRI still pending given patient education, plan last for TEE on 1/6, was counseled as patient was respiratory distress, undamaged response was called on 1/6 AM, patient was transferred to neurological ICU.   Assessment & Plan   Acute  Encephalopathy/?Bilateral MCA territory Infarcts -CT head: moderate region of decreased density in left parietal-occipital lobe -MRI brain pending -Neurology consulted and appreciated -Echocardiogram from 12/22: Normal EF -Speech, PT, OT consulted -Speech recommended NPO and alternate means of nutrition, has double tube  inserted. -Neurology recommended TEE , plan for TEE on 1/6 is postponed given patient critical condition. -Right carotid doppler: 1-39% ICA stenosis, unable to image left due to patient noncompliance -Lower ext doppler: negative for DVT or SVT -Blood cultures on admission negative to date -UA: shows 7-10 WBC, small leukocytes  Sepsis - Patient has fever, leukocytosis, source most likely UTI, and questionable infiltrate versus atelectasis in the right lung, HCAP/aspiration PNA ??. - Starting on IV vancomycin and Zosyn. - Full on blood cultures.  Acute encephalopathy - most likely metabolic given her hypernatremia, sepsis and CVA.  UTI -Urine culture shows Escherichia coli, sensitive to Rocephin and Zosyn. - ceftriaxone transitioned to Zosyn.  Hypernatremia/Hyperchloremia -Likely secondary to dehydration and poor oral intake complicated by diuretics -Lasix held -Will start free water via tube -Continue to monitor BMP  Hypokalemia -Replaced  Weakness -Cannot assess at this time -per brother, patient has been weak and not eating properly for some time  Acute renal failure - Continue to worsen, most likely due to volume depletion, continue with free water via NG tube.  Intertigo Dermatitis -Continue lamisil  Atrial fibrillation -Currently rate controlled -Continue metoprolol and aspirin  Diabetes mellitus, Type 2 -Continue lantus, ISS and CBG monitoring  Chronic kidney disease, stage III -  Cr at baseline, continue to monitor BMP  Nutritional status -IR placed tube for feedings -nutrition consulted  Code Status: Full  Family Communication: None at bedside,   Disposition Plan: Admitted  Time Spent in minutes   30 minutes  Procedures  None  Consults   Neurology Cardiology  DVT Prophylaxis  Lovenox  Lab Results  Component Value Date   PLT 174 05/03/2014    Medications  Scheduled Meds: . antiseptic oral rinse  7 mL Mouth Rinse BID  . aspirin  325 mg  Oral Daily   Or  . aspirin  300 mg Rectal Daily  . enoxaparin (LOVENOX) injection  40 mg Subcutaneous Q24H  . free water  300 mL Per Tube Q3H  . Influenza vac split quadrivalent PF  0.5 mL Intramuscular Tomorrow-1000  . insulin aspart  0-9 Units Subcutaneous Q4H  . terbinafine   Topical BID   Continuous Infusions: . feeding supplement (GLUCERNA 1.2 CAL) 1,000 mL (05/03/14 0936)   PRN Meds:.[DISCONTINUED] acetaminophen **OR** acetaminophen, [DISCONTINUED] ondansetron **OR** ondansetron (ZOFRAN) IV  Antibiotics    Anti-infectives    Start     Dose/Rate Route Frequency Ordered Stop   05/02/14 1015  cefTRIAXone (ROCEPHIN) 2 g in dextrose 5 % 50 mL IVPB - Premix  Status:  Discontinued     2 g100 mL/hr over 30 Minutes Intravenous Every 24 hours 05/02/14 1006 05/03/14 1023      Subjective:   [name] seen and examined today.  Patient is lethargic, rapid response called in a.m. secondary to respiratory distress, transferred to neuro ICU.  Objective:   Filed Vitals:   05/03/14 0710 05/03/14 0800 05/03/14 0900 05/03/14 1000  BP: 133/80 118/57 122/63 136/84  Pulse: 73 38 93 101  Temp: 102.4 F (39.1 C)     TempSrc:      Resp: 29 30 25  35  Height:   5\' 4"  (1.626 m)   Weight:   101.5 kg (223 lb 12.3 oz)   SpO2: 95% 93% 97% 97%    Wt Readings from Last 3 Encounters:  05/03/14 101.5 kg (223 lb 12.3 oz)  04/20/14 114.7 kg (252 lb 13.9 oz)     Intake/Output Summary (Last 24 hours) at 05/03/14 1025 Last data filed at 05/03/14 0900  Gross per 24 hour  Intake    300 ml  Output    700 ml  Net   -400 ml    Exam  General: Well developed, well nourished, , appears stated age, lethargic, has good gag reflex when suctioned with cannula.  HEENT: NCAT, mucous membranes dry.`   Cardiovascular: S1 S2 auscultated, irregular  Respiratory: Clear to auscultation bilaterally with equal chest rise, somewhat labored  Abdomen: Soft, obese, nontender, nondistended, + bowel  sounds  Extremities: warm dry without cyanosis clubbing.  Trace edema LLE. Mittens  Data Review   Micro Results Recent Results (from the past 240 hour(s))  MRSA PCR Screening     Status: None   Collection Time: 04/27/14  7:04 PM  Result Value Ref Range Status   MRSA by PCR NEGATIVE NEGATIVE Final    Comment:        The GeneXpert MRSA Assay (FDA approved for NASAL specimens only), is one component of a comprehensive MRSA colonization surveillance program. It is not intended to diagnose MRSA infection nor to guide or monitor treatment for MRSA infections.   Culture, blood (routine x 2)     Status: None (Preliminary result)   Collection Time: 04/28/14  9:41 AM  Result Value Ref Range Status   Specimen Description BLOOD RIGHT HAND  Final   Special Requests BOTTLES DRAWN AEROBIC ONLY 2CC  Final   Culture   Final           BLOOD CULTURE RECEIVED NO GROWTH TO DATE CULTURE WILL BE HELD FOR 5 DAYS BEFORE ISSUING A FINAL NEGATIVE REPORT Performed at [facility]    Report Status PENDING  Incomplete  Culture, blood (routine x 2)     Status: None (Preliminary result)   Collection Time: 04/28/14  9:50 AM  Result Value Ref Range Status   Specimen Description BLOOD LEFT HAND  Final   Special Requests BOTTLES DRAWN AEROBIC ONLY 1CC  Final   Culture   Final           BLOOD CULTURE RECEIVED NO GROWTH TO DATE CULTURE WILL BE HELD FOR 5 DAYS BEFORE ISSUING A FINAL NEGATIVE REPORT Performed at [facility]    Report Status PENDING  Incomplete  Culture, Urine     Status: None   Collection Time: 04/30/14 12:36 PM  Result Value Ref Range Status   Specimen Description URINE, CATHETERIZED  Final   Special Requests NONE  Final   Colony Count   Final    >=100,000 COLONIES/ML Performed at [facility]    Culture   Final    ESCHERICHIA COLI Performed at [facility]    Report Status 05/02/2014 FINAL  Final   Organism ID, Bacteria ESCHERICHIA COLI  Final       Susceptibility   Escherichia coli - MIC*    AMPICILLIN >=32 RESISTANT Resistant     CEFAZOLIN <=4 SENSITIVE Sensitive     CEFTRIAXONE <=1 SENSITIVE Sensitive     CIPROFLOXACIN >=4 RESISTANT Resistant     GENTAMICIN <=1 SENSITIVE Sensitive     LEVOFLOXACIN >=8 RESISTANT Resistant     NITROFURANTOIN <=16 SENSITIVE Sensitive     TOBRAMYCIN <=1 SENSITIVE Sensitive     TRIMETH/SULFA >=320 RESISTANT Resistant     PIP/TAZO <=4 SENSITIVE Sensitive     * ESCHERICHIA COLI    Radiology Reports Ct Abdomen Pelvis Wo Contrast  04/18/2014   CLINICAL DATA:  Status post fall; found on floor. Sepsis, with progressive weakness for several weeks. Erythema at the groin and abdominal wall. Leukocytosis. Initial encounter.  EXAM: CT ABDOMEN AND PELVIS WITHOUT CONTRAST  TECHNIQUE: Multidetector CT imaging of the abdomen and pelvis was performed following the standard protocol without IV contrast.  COMPARISON:  Lumbar spine radiograph performed 04/17/2014  FINDINGS: Right lower lobe airspace opacification is compatible with pneumonia.  The liver and spleen are unremarkable in appearance. Scattered stones are seen within the gallbladder. The gallbladder is otherwise unremarkable. The pancreas and adrenal glands are unremarkable.  Nonspecific perinephric stranding is noted bilaterally. Mild right-sided pelvicaliectasis remains within normal limits. There is no evidence of hydronephrosis. No renal or ureteral stones are seen.  No free fluid is identified. The small bowel is unremarkable in appearance. The stomach is within normal limits. No acute vascular abnormalities are seen. Minimal calcification is seen along the abdominal aorta and its branches.  The appendix is not definitely seen; there is no evidence for appendicitis. The colon is unremarkable in appearance.  A small-to-moderate umbilical hernia is noted, containing only fat.  The bladder is largely decompressed and grossly unremarkable. A large 8.0 cm fibroid is  suspected within the uterus. The uterus is mildly enlarged. The ovaries are grossly symmetric. No suspicious adnexal masses are  seen. Stop No inguinal lymphadenopathy is seen.  No acute osseous abnormalities are identified.  IMPRESSION: 1. Right lower lobe pneumonia noted; this likely explains the patient's symptoms. 2. Cholelithiasis; gallbladder otherwise unremarkable. 3. Small to moderate umbilical hernia, containing only fat. 4. Mildly enlarged fibroid uterus noted.   Electronically Signed   By: Roanna RaiderJeffery  Chang M.D.   On: 04/18/2014 05:07   Dg Chest 1 View  04/28/2014   CLINICAL DATA:  Fever. History of atrial fibrillation. Patient had a recent hospitalization for sepsis and pneumonia.  EXAM: CHEST - 1 VIEW  COMPARISON:  April 27, 2014  FINDINGS: The heart size and mediastinal contours are stable. The heart size is enlarged. There is mild diffuse increased pulmonary interstitium. There is no focal pneumonia or pleural effusion. The visualized skeletal structures are stable.  IMPRESSION: Mild congestive heart failure.   Electronically Signed   By: Sherian ReinWei-Chen  Lin M.D.   On: 04/28/2014 08:38   Dg Chest 2 View  05/02/2014   CLINICAL DATA:  Labored breathing, fever and altered mental status. Left-sided weakness. Acute kidney disease.  EXAM: CHEST  2 VIEW  COMPARISON:  04/28/2014  FINDINGS: The enteric tube courses into the region of the stomach and off the inferior portion of the film as tip is not well seen. There has been placement of a right-sided PICC line with tip overlying the region of the SVC.  Lungs are adequately inflated with mild infrahilar vascular crowding. No focal consolidation or effusion. Cardiomediastinal silhouette and remainder of the exam is unchanged.  IMPRESSION: No active cardiopulmonary disease.  Tubes and lines as described.   Electronically Signed   By: Elberta Fortisaniel  Boyle M.D.   On: 05/02/2014 11:10   Dg Chest 2 View  04/17/2014   CLINICAL DATA:  Pt arrived via EMS with report of  falling without injury d/t "legs given out" on concrete garage floor since 1300 and was found by spouse ast 1845. EMS reported that CBG was greater than 600, poor appetite, noncompliant with diet d/t noted soft drinks and sweeten tea in house, strong urine smell, and red rash to abd folds. Pt c/o lower back pain, large bruising to left hip and groin, denies any chest complaints, best obtainable images due to pt size and condition  EXAM: CHEST  2 VIEW  COMPARISON:  None.  FINDINGS: There are low lung volumes with patient rotation to the right. The lateral view is limited. There is elevation of the right hemidiaphragm. The aorta appears ectatic and somewhat unfolded. The heart size is normal. Mild atelectasis is present at the right lung base. There is no consolidation, significant pleural effusion or pneumothorax. No acute osseous findings identified.  IMPRESSION: Elevated right hemidiaphragm with associated right basilar atelectasis. No consolidation or significant edema.   Electronically Signed   By: Roxy HorsemanBill  Veazey M.D.   On: 04/17/2014 21:36   Dg Lumbar Spine 2-3 Views  04/17/2014   CLINICAL DATA:  Fall.  Weakness.  Initial evaluation.  EXAM: LUMBAR SPINE - 2-3 VIEW  COMPARISON:  None.  FINDINGS: Five lumbar type vertebral bodies are present. There is mild levoscoliosis. Vertebral bodies are otherwise normally aligned with preservation of the normal lumbar lordosis.  There is anterior wedging of the L1 and L2 vertebral bodies, favored to be chronic in nature. Vertebral body heights are otherwise preserved. No definite acute fracture listhesis.  No soft tissue abnormality.  Moderate multilevel degenerative disc disease present within the visualized spine.  IMPRESSION: 1. No definite acute traumatic injury within  the lumbar spine. 2. Anterior wedging of the L1 and L2 vertebral bodies, favored to be chronic in nature. Possible acute compression fractures are not entirely excluded. Correlation with site of pain  recommended.   Electronically Signed   By: Rise Mu M.D.   On: 04/17/2014 21:37   Dg Pelvis 1-2 Views  04/17/2014   CLINICAL DATA:  Patient fell on concrete floor earlier in the day  EXAM: PELVIS - 1-2 VIEW  COMPARISON:  None.  FINDINGS: There is no evidence of pelvic fracture or dislocation. 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 No erosive change.  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 No fracture or dislocation.   Electronically Signed   By: Bretta Bang M.D.   On: 04/17/2014 21:37   Dg Tibia/fibula Left  04/17/2014   CLINICAL DATA:  Status post fall; acute onset of left lower leg pain. Initial encounter.  EXAM: LEFT TIBIA AND FIBULA - 2 VIEW  COMPARISON:  None.  FINDINGS: There is no evidence of fracture or dislocation. The tibia and fibula appear intact.  Marginal osteophytes are seen arising at the medial and lateral compartments of the knee, with wall osteophytes also seen. No knee joint effusion is identified.  The ankle mortise is incompletely assessed, but appears grossly unremarkable. A plantar calcaneal spur is incidentally noted. No significant soft tissue abnormalities are identified.  IMPRESSION: No evidence of fracture or dislocation.   Electronically Signed   By: Roanna Raider M.D.   On: 04/17/2014 22:52   Dg Abd 1 View  05/01/2014   CLINICAL DATA:  Feeding tube placement.  EXAM: ABDOMEN - 1 VIEW  COMPARISON:  None.  FINDINGS: A feeding tube is seen in place. Contrast injection shows feeding tube positioned within the distal duodenum near the ligament of Treitz.  IMPRESSION: Feeding tube tip in distal duodenum near ligament of Treitz.   Electronically Signed   By: Myles Rosenthal M.D.   On: 05/01/2014 11:01   Ct Head Wo Contrast  05/02/2014   CLINICAL DATA:  Altered mental status, stroke, followup, atrial fibrillation, diabetes type  2  EXAM: CT HEAD WITHOUT CONTRAST  TECHNIQUE: Contiguous axial images were obtained from the base of the skull through the vertex without intravenous contrast.  COMPARISON:  04/27/2014  FINDINGS: Generalized atrophy.  Normal ventricular morphology.  No midline shift or mass effect.  Subacute large infarct involving the LEFT temporoparietal region extending posteriorly to the LEFT occipital lobe.  No intracranial hemorrhage, mass lesion or additional infarcts identified.  No definite extra-axial fluid collections.  Abnormalities the RIGHT temporal lobe may be related to volume-averaging at the sylvian fissure in no definite RIGHT temporal infarct is seen.  Feeding tube versus nasogastric tube traverses RIGHT nasal passages.  Bones and sinuses otherwise unremarkable.  IMPRESSION: Atrophy with again identified subacute infarct involving the LEFT temporoparietal region extending into the occipital lobe.  No new intracranial abnormalities.   Electronically Signed   By: Ulyses Southward M.D.   On: 05/02/2014 16:55   Ct Head Wo Contrast  04/27/2014   CLINICAL DATA:  Altered mental status.  EXAM: CT HEAD WITHOUT CONTRAST  TECHNIQUE: Contiguous axial images were obtained from the base of the skull through the vertex without intravenous contrast.  COMPARISON:  None.  FINDINGS: There is ill-defined decreased density involving the moderate portion of the left posterior parietal occipital  lobes extending from the lateral ventricle of the cortex. There is an additional questionable region of decreased density in the right temporal lobe versus volume averaging. There is no associated hemorrhage. There is no midline shift. There is mild prominence of the extra-axial CSF space on the right, may be related to atrophy versus subdural hygroma. The calvarium is intact. Included paranasal sinuses and mastoid air cells are well aerated.  IMPRESSION: Moderate region of decreased density in the left parietal-occipital lobe concerning for  subacute infarct. There is additional questionable region of decreased density in the right temporal lobe. Given the questioned multifocal involvement, MRI of the brain, preferably with contrast, recommended for further evaluation to exclude the possibility of underlying mass lesions.  These results were called by telephone at the time of interpretation on 04/27/2014 at 3:28 pm to Dr. Pricilla Loveless , who verbally acknowledged these results.   Electronically Signed   By: Rubye Oaks M.D.   On: 04/27/2014 15:31   Dg Chest Port 1 View  05/03/2014   CLINICAL DATA:  Fever tonight with some chest congestion.  EXAM: PORTABLE CHEST - 1 VIEW  COMPARISON:  05/02/2014  FINDINGS: Technically limited study due to patient positioning and probable kyphosis. Shallow inspiration. Mild cardiac enlargement without significant vascular congestion. Developing linear atelectasis or infiltration in the right lung base. No apparent pneumothorax. PICC line and enteric tubes remain unchanged in position.  IMPRESSION: Shallow inspiration with linear atelectasis in the right lung base. Appliances are unchanged in position.   Electronically Signed   By: Burman Nieves M.D.   On: 05/03/2014 03:45   Dg Chest Port 1 View  04/27/2014   CLINICAL DATA:  Altered mental status.  EXAM: PORTABLE CHEST - 1 VIEW  COMPARISON:  April 17, 2014.  FINDINGS: The heart size and mediastinal contours are within normal limits. Both lungs are clear. No pneumothorax or pleural effusion is noted. The visualized skeletal structures are unremarkable.  IMPRESSION: No acute cardiopulmonary abnormality seen.   Electronically Signed   By: Roque Lias M.D.   On: 04/27/2014 14:43   Dg Vangie Bicker G Tube Plc W/fl-no Rad  05/01/2014   CLINICAL DATA:    NASO G TUBE PLACEMENT WITH FLUORO  Fluoroscopy was utilized by the requesting physician.  No radiographic  interpretation.     CBC  Recent Labs Lab 04/27/14 1504 04/28/14 0634 04/30/14 0429 05/01/14 0635  05/02/14 0410 05/03/14 0401  WBC 18.8* 21.1* 16.8* 13.6* 13.8* 13.5*  HGB 12.2 12.3 11.6* 10.7* 11.1* 11.9*  HCT 40.0 37.6 36.8 34.7* 36.5 38.8  PLT 181 177 152 122* 151 174  MCV 95.9 95.2 96.6 95.9 95.8 96.8  MCH 29.3 31.1 30.4 29.6 29.1 29.7  MCHC 30.5 32.7 31.5 30.8 30.4 30.7  RDW 16.7* 16.9* 16.8* 16.7* 16.5* 16.9*  LYMPHSABS 1.5  --  2.4  --   --   --   MONOABS 0.8  --  0.9  --   --   --   EOSABS 0.1  --  0.2  --   --   --   BASOSABS 0.0  --  0.0  --   --   --     Chemistries   Recent Labs Lab 04/27/14 1504  04/29/14 0350 04/30/14 0429 05/01/14 0635 05/02/14 0410 05/03/14 0401  NA 166*  < > 167* 163* 161* 162* 164*  K 3.2*  < > 3.1* 3.2* 3.0* 3.4* 4.5  CL 128*  < > >130* 127* 126* 125* >130*  CO2  29  < > 26 27 30 28 25   GLUCOSE 102*  < > 145* 173* 182* 170* 196*  BUN 17  < > 17 17 15 19  31*  CREATININE 1.66*  < > 1.96* 1.91* 1.82* 1.83* 2.16*  CALCIUM 8.7  < > 8.1* 8.2* 8.1* 8.2* 8.4  MG  --   --  1.8  --   --   --   --   AST 33  --   --   --   --   --   --   ALT 17  --   --   --   --   --   --   ALKPHOS 315*  --   --   --   --   --   --   BILITOT 0.7  --   --   --   --   --   --   < > = values in this interval not displayed. ------------------------------------------------------------------------------------------------------------------ estimated creatinine clearance is 28.5 mL/min (by C-G formula based on Cr of 2.16). ------------------------------------------------------------------------------------------------------------------ No results for input(s): HGBA1C in the last 72 hours. ------------------------------------------------------------------------------------------------------------------ No results for input(s): CHOL, HDL, LDLCALC, TRIG, CHOLHDL, LDLDIRECT in the last 72 hours. ------------------------------------------------------------------------------------------------------------------ No results for input(s): TSH, T4TOTAL, T3FREE, THYROIDAB in  the last 72 hours.  Invalid input(s): FREET3 ------------------------------------------------------------------------------------------------------------------ No results for input(s): VITAMINB12, FOLATE, FERRITIN, TIBC, IRON, RETICCTPCT in the last 72 hours.  Coagulation profile No results for input(s): INR, PROTIME in the last 168 hours.  No results for input(s): DDIMER in the last 72 hours.  Cardiac Enzymes  Recent Labs Lab 04/27/14 1504  TROPONINI 0.06*   ------------------------------------------------------------------------------------------------------------------ Invalid input(s): POCBNP  Time spent: 35 minutes.  Zacari Radick D.O. on 05/03/2014 at 10:25 AM  Between 7am to 7pm - Pager - [phone]  After 7pm go to [email or web address] - password [email or web address]  And look for the night coverage person covering for me after hours  [facility] Office  [phone]

## 2014-05-03 NOTE — Progress Notes (Addendum)
Patients temp at 0224 was 102 oral gave her 650 mg Tylenol at  suppository at 0235 and will notify attending. Will recheck temp around 0300. Temp is now 98.7 oral.

## 2014-05-03 NOTE — Progress Notes (Signed)
OT Cancellation Note  Patient Details Name: [name] MRN: [id number] DOB: 01/31/1946   Cancelled Treatment:    Reason Eval/Treat Not Completed: Medical issues which prohibited therapy.  Pt with worsening respiratory status and lethargy.  Will continue to follow and initiate OT as appropriate.  [name], Nevaen Tredway [name] 05/03/2014, 8:36 AM

## 2014-05-03 NOTE — Progress Notes (Signed)
Patient Na is 164 and Cl is 130 notified the atending, also rapid response notified and they are assiting with the patient now.

## 2014-05-03 NOTE — Progress Notes (Signed)
Follow-up: Notified by Eula ListenK Kirby-Graham NP (my colleague at [facility]) regarding decline in pt's status over last several hours. See previous note. Rapid response has been at bedside. At bedside pt noted responsive only to noxious stimuli. GCS 6. No gag reflex. Noted w/ rapid shallow respirations. Currently on NRB at 100% which has improved sats to 94-96%. Recent ABG at 0500 revealed p02 of 58. Remaining ABG unremarkable.  BBS w/ coarse rhonchi. CXR w/o acute findings. Pt w/ rectal temp currently of 104. Na, Cl and sCr all trending back up this am. Pt scheduled for TEE this am. Pt remains full code.  Assessment/Plan: 1. Decreased LOC: In setting of (?) bilateral MCA embolic infarcts, hypernatremia and infectious process. MRI pending. Will transfer to SDU ([id number]). Pt currently at high risk for intubation. Dr Thad Rangereynolds w/ neurology service has been updated. 2. Fever: Pt currently on Rocephin for UTI. Meets sepsis criteria. Will defer further changes in pt's plan of care to rounding MD. Discussed w/ Dr Randol KernElgergawy who will see pt in SDU and plans to readdress code status w/ pt's husband. Will continue to monitor closely in SDU.   Leanne ChangKatherine P. Hugh Garrow, NP-C [facility] Pager [phone]

## 2014-05-03 NOTE — Significant Event (Signed)
Rapid Response Event Note Called per floor RN for patietn with worseenign AMS and increased 02 need. Prior to my arrival, [facility] NP K.Kirby paged and ABG ordered, in process.  Overview: Time Called: 0545 Arrival Time: 0550 Event Type: Respiratory, Neurologic, Cardiac  Initial Focused Assessment: Upon my arrival pt found resting in bed somnolent. Arouses to deep sternal rub and oral suction, does not open eyes. Moves extremities to pain only. RR 40s, Po2 94 on 2 LNC. Lungs course, rhonchi heard in bialteral uppr lobes, dimished bases. Pt does not have a GAG reflex with deep oral suction.   EKG completed yielding ST with PACS. BP WNL  Interventions: Pt warm to touch, rectal temp obtained yielding 104.5. Cooling measures initiated with ice. [facility] NP K.Scorr called to bedside. Pt placed on NRB. Step down bed ordered. Pt transferred to 3 M 08  Event Summary: Name of Physician Notified: K.Scorr NP [facility]KK[facility]by at 0550    at    Outcome: Transferred (Comment)     White, James IvanoffBrooke Leigh Sheba Whaling

## 2014-05-03 NOTE — Progress Notes (Signed)
Chaplain introduced herself to pt brother and sister-in-law. Chaplain listened to pt brother narrate family dynamics and characteristics that he loves about her sister. He describes her as a very giving and intelligent woman. Pt family reports being tired and that if medical staff cannot get in touch with pt husband to have call them and they can get in touch. Family also appreciative of sitter. Family appreciative of [facility] who helped calm pt when she was agitated. Chaplain will continue to follow. Page chaplain as needed.    05/03/14 1100  Clinical Encounter Type  Visited With Patient and family together;Health care provider  Visit Type Follow-up;Spiritual support  Spiritual Encounters  Spiritual Needs Emotional  Stress Factors  Family Stress Factors Family relationships  Shanetha Bradham, Loa SocksCourtney F, Chaplain 05/03/2014 11:44 AM

## 2014-05-03 NOTE — Progress Notes (Signed)
Dr.Elgergawy notified of critical lab values : NA 166 Chloride 130 . Continue plan of care .

## 2014-05-03 NOTE — Progress Notes (Signed)
RN paged this NP about one hour ago stating pt's respirations had been getting more shallow during shift. She spiked a fever earlier and ordered a CXR which was neg for acute and r/p blood cultures. ABG stat shows normal pH, normal PCO2, low PO2 at 58. O2 changed to NRB and tele reordered. My colleague who is physically located at [facility] is going to floor to see pt to evaluate for need of transfer. Pt has had hypernatremia since admission and this am it is slightly higher. However, pt has been NPO for TEE this am which has included holding her free H20 per NGT. This probably explains the value.  KJKG, NP

## 2014-05-03 NOTE — Progress Notes (Signed)
Patients breathing has progressively gotten shallower since 0000, at 0320 put patient on 2 litre's of 02 through Nasal Canula this has brought her O2 sat up to mid 90's. Will continue to monitor.

## 2014-05-03 NOTE — Progress Notes (Signed)
STROKE TEAM PROGRESS NOTE   HISTORY ALEJAH ARISTIZABAL is a 69 y.o. female with a history of atrial fibrillation and recent hospitalization for sepsis, pneumonia rhabdomyolysis as well as hypernatremia and acute kidney injury, brought to the emergency room at [facility] for increased confusion and speech abnormality. Serum sodium was 169. CT scan of her head showed low density areas involving large area of left MCA territory as well as right temporal abnormality, with appearance indicative of probable subacute infarctions. MRI was recommended but could not be obtained because of patient's agitated state. She was transferred here for further evaluation and management with stroke service intervention.  LSN: Unclear tPA Given: No: CT abnormalities is an on presentation, as well as unclear when last known well. mRankin:  SUBJECTIVE (INTERVAL HISTORY) No family at bedside do around. Patient was transferred to ICU this morning because of the respiratory distress, high-grade fever, and worsening hypernatremia. Repeat CAT scan overnight shows stable left MCA stroke.  OBJECTIVE Temp:  [98.2 F (36.8 C)-104.5 F (40.3 C)] 102.2 F (39 C) (01/06 1553) Pulse Rate:  [38-125] 116 (01/06 1600) Cardiac Rhythm:  [-]  Resp:  [25-37] 34 (01/06 1600) BP: (98-136)/(57-92) 111/60 mmHg (01/06 1600) SpO2:  [92 %-98 %] 93 % (01/06 1600) Weight:  [223 lb 12.3 oz (101.5 kg)-238 lb 14.4 oz (108.364 kg)] 223 lb 12.3 oz (101.5 kg) (01/06 0900)   Recent Labs Lab 05/03/14 0017 05/03/14 0411 05/03/14 0756 05/03/14 1139 05/03/14 1551  GLUCAP 194* 164* 164* 168* 162*    Recent Labs Lab 04/29/14 0350 04/30/14 0429 05/01/14 0635 05/02/14 0410 05/03/14 0401  NA 167* 163* 161* 162* 164*  K 3.1* 3.2* 3.0* 3.4* 4.5  CL >130* 127* 126* 125* >130*  CO2 GLUCOSE 145* 173* 182* 170* 196*  BUN 31*  CREATININE 1.96* 1.91* 1.82* 1.83* 2.16*  CALCIUM 8.1* 8.2* 8.1* 8.2* 8.4  MG 1.8   --   --   --   --     Recent Labs Lab 04/27/14 1504  AST 33  ALT 17  ALKPHOS 315*  BILITOT 0.7  PROT 6.9  ALBUMIN 2.5*    Recent Labs Lab 04/27/14 1504 04/28/14 0634 04/30/14 0429 05/01/14 0635 05/02/14 0410 05/03/14 0401  WBC 18.8* 21.1* 16.8* 13.6* 13.8* 13.5*  NEUTROABS 16.3*  --  13.3*  --   --   --   HGB 12.2 12.3 11.6* 10.7* 11.1* 11.9*  HCT 40.0 37.6 36.8 34.7* 36.5 38.8  MCV 95.9 95.2 96.6 95.9 95.8 96.8  PLT 181 177 152 122* 151 174    Recent Labs Lab 04/27/14 1504  CKTOTAL 43  TROPONINI 0.06*   No results for input(s): LABPROT, INR in the last 72 hours. No results for input(s): COLORURINE, LABSPEC, PHURINE, GLUCOSEU, HGBUR, BILIRUBINUR, KETONESUR, PROTEINUR, UROBILINOGEN, NITRITE, LEUKOCYTESUR in the last 72 hours.  Invalid input(s): APPERANCEUR     Component Value Date/Time   CHOL 101 04/29/2014 0350   TRIG 182* 04/29/2014 0350   HDL 26* 04/29/2014 0350   CHOLHDL 3.9 04/29/2014 0350   VLDL 36 04/29/2014 0350   LDLCALC 39 04/29/2014 0350   Lab Results  Component Value Date   HGBA1C 12.1* 04/29/2014      Component Value Date/Time   LABOPIA POSITIVE* 04/18/2014 1754   COCAINSCRNUR NONE DETECTED 04/18/2014 1754   LABBENZ NONE DETECTED 04/18/2014 1754   AMPHETMU NONE DETECTED 04/18/2014 1754   THCU NONE DETECTED 04/18/2014 1754   LABBARB  NONE DETECTED 04/18/2014 1754    No results for input(s): ETH in the last 168 hours.  I have personally reviewed the radiological images below and agree with the radiology interpretations.  Ct Head Wo Contrast 04/27/2014    Moderate region of decreased density in the left parietal-occipital lobe concerning for subacute infarct. There is additional questionable region of decreased density in the right temporal lobe. Given the questioned multifocal involvement, MRI of the brain, preferably with contrast, recommended for further evaluation to exclude the possibility of underlying mass lesions.   05/02/14  Atrophy  with again identified subacute infarct involving the LEFT temporoparietal region extending into the occipital lobe. No new intracranial abnormalities.  MRI not able to be done due to agitation  Dg Chest [facility] 04/27/2014    No acute cardiopulmonary abnormality seen.  DG Chest 1 View 04/28/2014  Mild congestive heart failure 05/02/2014 No active cardiopulmonary disease.  2D echo 04/18/14 - Left ventricle: The cavity size was normal. Wall thickness was normal. Systolic function was normal. The estimated ejection fraction was in the range of 55% to 60%. - Left atrium: The atrium was mildly dilated. Impressions: - Overall very poor image quality.  CUS -  Bilateral: 1-39% ICA stenosis. Vertebral artery flow is antegrade.   Venous doppler - There is no obvious evidence of DVT or SVT noted in the bilateral lower extremities.   EEG -  1) Asymmetric PDR. 2) irregular delta activity.  Clinical Interpretation: This EEG is most consistent with a generalized cerebral dysfunction with a superimposed area of cortical dysfunction as evidenced by attenuated voltages in the left posterior quadrant. There was no seizure or seizure predisposition recorded on this study.    PHYSICAL EXAM  Temp:  [98.2 F (36.8 C)-104.5 F (40.3 C)] 102.2 F (39 C) (01/06 1553) Pulse Rate:  [38-125] 116 (01/06 1600) Resp:  [25-37] 34 (01/06 1600) BP: (98-136)/(57-92) 111/60 mmHg (01/06 1600) SpO2:  [92 %-98 %] 93 % (01/06 1600) Weight:  [223 lb 12.3 oz (101.5 kg)-238 lb 14.4 oz (108.364 kg)] 223 lb 12.3 oz (101.5 kg) (01/06 0900)  General - morbid obesity, well developed, intermittent restless in bed, eyes not open on stimulation.  Ophthalmologic - not able to test due to agitation.  Cardiovascular - Irregular rate and rhythm with no murmur.  Neuro - obtunded, not open eyes on voice or stimulation, intermittent restless and agitated, not following commands, global aphasia, not able to name or  repeat. No facial asymmetry. PERRL, EOMI, move all extremities spontaneously and equally. Reflex 1+ and no babinski.  ASSESSMENT/PLAN Ms. JOURNIEE FELDKAMP is a 69 y.o. female with history of atrial fibrillation, diabetes mellitus, recent sepsis, presenting with confusion and speech abnormalities . She did not receive IV t-PA due to unknown time of onset.  Stroke: Bilateral MCA embolic infarcts - etiology not clear but likely due to atrial fibrillation or endocarditis due to sepsis.  Resultant  confusion/global aphasia  MRI (not yet performed secondary to agitation) - consider MRI once agitation resolved.  Repeat CAT scan showed stable left MCA stroke.  Carotid Doppler - 1-39 right ICA stenosis. will repeat CUS on the left today.  2D Echo EF 55-60%. Poor images. No cardiac source of emboli identified.  Hold off TEE as patient is not stable. Will consider once patient stabilized.  LE doppler - negative for DVT  LDL 39  HgbA1c 12.1, not at goal  Lovenox for VTE prophylaxis   Diet NPO time specified no liquids, on  tube feeding  aspirin 81 mg orally every day prior to admission, now on aspirin 300 mg suppository daily or ASA 325mg  via PANDA  Ongoing aggressive stroke risk factor management  Therapy recommendations:  Pending  Disposition:  Pending  hypernatremia - due to dehydration.  - Na 164 today.  - increase free water 300ml Q3h  - Na Q8 - EEG no seizure  UTI - on rocephine - Culture no growth to date  Hx of Afib - on documentation - family stated that they have not being told about hx of Afib - if comfirmed, she need life long anticoagulation - continue tele - multiple runs of probable MAT in the 100-160 range  - may consider cardiology consult  Fever with recent sepsis - treated in [facility]  - 04/28/14 blood culture NGTD - Repeat culture today - Leukocytosis - WBC's Friday 21.1->16.8->13.6 - TEE on hold as patient is not stable  Diabetes  HgbA1c 12.1 goal <  7.0  Uncontrolled  SSI  Close monitoring  Hypertension  Home meds:  Lopressor 50 mg twice a day  Stable  Hyperlipidemia  Home meds:  No lipid lowering medications prior to admission.  LDL 39, goal < 70  Nutrition  S/p PANDA   On tube feeding  On free water  Other Stroke Risk Factors  Advanced age  Obesity, Body mass index is 38.39 kg/(m^2).   Other Active Problems  Hypokalemia - supplement 4.5 today  Elevated creatinine 2.16 today  Leukocytosis - 16.8->13.6->13.8->13.5  Mild congestive heart failure by chest x-ray 04/28/2014  Other Pertinent History  No regular medical follow-up for 17 years.   Hospital day # 6  This patient is critically ill due to respiratory distress, hypernatremia, high grade fever and at significant risk of neurological worsening, death from brain edema, respiratory failure, sepsis. This patient's care requires constant monitoring of vital signs, hemodynamics, respiratory and cardiac monitoring, review of multiple databases, neurological assessment, other specialists and medical decision making of high complexity. I have updated husband over the phone. spent 45 minutes of neurocritical care time in the care of this patient.  Marvel PlanJindong Korianna Washer, MD PhD Stroke Neurology 05/03/2014 5:12 PM    To contact Stroke Continuity provider, please refer to [email or web address]. After hours, contact General Neurology

## 2014-05-04 ENCOUNTER — Inpatient Hospital Stay (HOSPITAL_COMMUNITY): Payer: Medicare Other

## 2014-05-04 DIAGNOSIS — R579 Shock, unspecified: Secondary | ICD-10-CM

## 2014-05-04 DIAGNOSIS — A419 Sepsis, unspecified organism: Secondary | ICD-10-CM | POA: Insufficient documentation

## 2014-05-04 DIAGNOSIS — N390 Urinary tract infection, site not specified: Secondary | ICD-10-CM

## 2014-05-04 DIAGNOSIS — J9600 Acute respiratory failure, unspecified whether with hypoxia or hypercapnia: Secondary | ICD-10-CM | POA: Insufficient documentation

## 2014-05-04 DIAGNOSIS — R4020 Unspecified coma: Secondary | ICD-10-CM

## 2014-05-04 DIAGNOSIS — E11331 Type 2 diabetes mellitus with moderate nonproliferative diabetic retinopathy with macular edema: Secondary | ICD-10-CM

## 2014-05-04 DIAGNOSIS — J9601 Acute respiratory failure with hypoxia: Secondary | ICD-10-CM

## 2014-05-04 DIAGNOSIS — R6521 Severe sepsis with septic shock: Secondary | ICD-10-CM

## 2014-05-04 DIAGNOSIS — A499 Bacterial infection, unspecified: Secondary | ICD-10-CM

## 2014-05-04 DIAGNOSIS — R40243 Glasgow coma scale score 3-8: Secondary | ICD-10-CM

## 2014-05-04 LAB — LACTIC ACID, PLASMA: LACTIC ACID, VENOUS: 2.3 mmol/L — AB (ref 0.5–2.2)

## 2014-05-04 LAB — CBC
HEMATOCRIT: 33.2 % — AB (ref 36.0–46.0)
HEMOGLOBIN: 9.9 g/dL — AB (ref 12.0–15.0)
MCH: 28.9 pg (ref 26.0–34.0)
MCHC: 29.8 g/dL — ABNORMAL LOW (ref 30.0–36.0)
MCV: 97.1 fL (ref 78.0–100.0)
Platelets: 151 10*3/uL (ref 150–400)
RBC: 3.42 MIL/uL — AB (ref 3.87–5.11)
RDW: 16.9 % — ABNORMAL HIGH (ref 11.5–15.5)
WBC: 15.5 10*3/uL — ABNORMAL HIGH (ref 4.0–10.5)

## 2014-05-04 LAB — COMPREHENSIVE METABOLIC PANEL
ALBUMIN: 1.8 g/dL — AB (ref 3.5–5.2)
ALK PHOS: 635 U/L — AB (ref 39–117)
ALT: 38 U/L — ABNORMAL HIGH (ref 0–35)
ANION GAP: 5 (ref 5–15)
AST: 128 U/L — AB (ref 0–37)
BUN: 37 mg/dL — AB (ref 6–23)
CO2: 32 mmol/L (ref 19–32)
Calcium: 8.1 mg/dL — ABNORMAL LOW (ref 8.4–10.5)
Chloride: 129 mEq/L — ABNORMAL HIGH (ref 96–112)
Creatinine, Ser: 2.23 mg/dL — ABNORMAL HIGH (ref 0.50–1.10)
GFR calc Af Amer: 25 mL/min — ABNORMAL LOW (ref >90)
GFR calc non Af Amer: 21 mL/min — ABNORMAL LOW (ref >90)
Glucose, Bld: 190 mg/dL — ABNORMAL HIGH (ref 70–99)
POTASSIUM: 3.2 mmol/L — AB (ref 3.5–5.1)
SODIUM: 166 mmol/L — AB (ref 135–145)
TOTAL PROTEIN: 5.7 g/dL — AB (ref 6.0–8.3)
Total Bilirubin: 0.6 mg/dL (ref 0.3–1.2)

## 2014-05-04 LAB — URINALYSIS W MICROSCOPIC (NOT AT ARMC)
Bilirubin Urine: NEGATIVE
Glucose, UA: NEGATIVE mg/dL
Ketones, ur: NEGATIVE mg/dL
Nitrite: NEGATIVE
PROTEIN: 30 mg/dL — AB
Specific Gravity, Urine: 1.018 (ref 1.005–1.030)
UROBILINOGEN UA: 0.2 mg/dL (ref 0.0–1.0)
pH: 5 (ref 5.0–8.0)

## 2014-05-04 LAB — BASIC METABOLIC PANEL
ANION GAP: 8 (ref 5–15)
Anion gap: 7 (ref 5–15)
BUN: 35 mg/dL — ABNORMAL HIGH (ref 6–23)
BUN: 38 mg/dL — ABNORMAL HIGH (ref 6–23)
CO2: 24 mmol/L (ref 19–32)
CO2: 25 mmol/L (ref 19–32)
CREATININE: 2.1 mg/dL — AB (ref 0.50–1.10)
Calcium: 7.5 mg/dL — ABNORMAL LOW (ref 8.4–10.5)
Calcium: 7.7 mg/dL — ABNORMAL LOW (ref 8.4–10.5)
Chloride: 128 mEq/L — ABNORMAL HIGH (ref 96–112)
Chloride: 123 mEq/L — ABNORMAL HIGH (ref 96–112)
Creatinine, Ser: 2.43 mg/dL — ABNORMAL HIGH (ref 0.50–1.10)
GFR calc Af Amer: 27 mL/min — ABNORMAL LOW (ref >90)
GFR calc Af Amer: 22 mL/min — ABNORMAL LOW (ref >90)
GFR calc non Af Amer: 19 mL/min — ABNORMAL LOW (ref >90)
GFR, EST NON AFRICAN AMERICAN: 23 mL/min — AB (ref >90)
GLUCOSE: 270 mg/dL — AB (ref 70–99)
Glucose, Bld: 348 mg/dL — ABNORMAL HIGH (ref 70–99)
Potassium: 4.4 mmol/L (ref 3.5–5.1)
Potassium: 3.9 mmol/L (ref 3.5–5.1)
SODIUM: 159 mmol/L — AB (ref 135–145)
Sodium: 156 mmol/L — ABNORMAL HIGH (ref 135–145)

## 2014-05-04 LAB — POCT I-STAT 3, ART BLOOD GAS (G3+)
Bicarbonate: 24.8 mEq/L — ABNORMAL HIGH (ref 20.0–24.0)
O2 SAT: 100 %
TCO2: 26 mmol/L (ref 0–100)
pCO2 arterial: 39.6 mmHg (ref 35.0–45.0)
pH, Arterial: 7.405 (ref 7.350–7.450)
pO2, Arterial: 294 mmHg — ABNORMAL HIGH (ref 80.0–100.0)

## 2014-05-04 LAB — TROPONIN I
Troponin I: 0.1 ng/mL — ABNORMAL HIGH (ref <0.031)
Troponin I: 0.1 ng/mL — ABNORMAL HIGH (ref <0.031)

## 2014-05-04 LAB — GLUCOSE, CAPILLARY
GLUCOSE-CAPILLARY: 167 mg/dL — AB (ref 70–99)
GLUCOSE-CAPILLARY: 211 mg/dL — AB (ref 70–99)
GLUCOSE-CAPILLARY: 251 mg/dL — AB (ref 70–99)
Glucose-Capillary: 138 mg/dL — ABNORMAL HIGH (ref 70–99)
Glucose-Capillary: 271 mg/dL — ABNORMAL HIGH (ref 70–99)

## 2014-05-04 LAB — CULTURE, BLOOD (ROUTINE X 2)
CULTURE: NO GROWTH
Culture: NO GROWTH

## 2014-05-04 LAB — CLOSTRIDIUM DIFFICILE BY PCR: Toxigenic C. Difficile by PCR: NEGATIVE

## 2014-05-04 LAB — PROCALCITONIN: Procalcitonin: 1.24 ng/mL

## 2014-05-04 MED ORDER — INSULIN ASPART 100 UNIT/ML ~~LOC~~ SOLN
1.0000 [IU] | SUBCUTANEOUS | Status: DC
  Administered 2014-05-04: 1 [IU] via SUBCUTANEOUS

## 2014-05-04 MED ORDER — NOREPINEPHRINE BITARTRATE 1 MG/ML IV SOLN
2.0000 ug/min | INTRAVENOUS | Status: DC
  Administered 2014-05-04: 20 ug/min via INTRAVENOUS
  Administered 2014-05-04: 40 ug/min via INTRAVENOUS
  Filled 2014-05-04 (×5): qty 4

## 2014-05-04 MED ORDER — NOREPINEPHRINE BITARTRATE 1 MG/ML IV SOLN
2.0000 ug/min | INTRAVENOUS | Status: DC
  Administered 2014-05-04: 30 ug/min via INTRAVENOUS
  Administered 2014-05-05: 16 ug/min via INTRAVENOUS
  Administered 2014-05-05: 25 ug/min via INTRAVENOUS
  Administered 2014-05-06 (×2): 10 ug/min via INTRAVENOUS
  Filled 2014-05-04 (×6): qty 16

## 2014-05-04 MED ORDER — CETYLPYRIDINIUM CHLORIDE 0.05 % MT LIQD
7.0000 mL | Freq: Four times a day (QID) | OROMUCOSAL | Status: DC
  Administered 2014-05-04 – 2014-05-17 (×53): 7 mL via OROMUCOSAL

## 2014-05-04 MED ORDER — SODIUM CHLORIDE 0.9 % IV BOLUS (SEPSIS)
1000.0000 mL | Freq: Once | INTRAVENOUS | Status: AC
  Administered 2014-05-04: 1000 mL via INTRAVENOUS

## 2014-05-04 MED ORDER — POTASSIUM CHLORIDE 20 MEQ/15ML (10%) PO SOLN
40.0000 meq | ORAL | Status: AC
  Administered 2014-05-04 (×2): 40 meq via ORAL
  Filled 2014-05-04 (×3): qty 30

## 2014-05-04 MED ORDER — ROCURONIUM BROMIDE 50 MG/5ML IV SOLN
50.0000 mg | Freq: Once | INTRAVENOUS | Status: AC
  Administered 2014-05-04: 50 mg via INTRAVENOUS
  Filled 2014-05-04: qty 5

## 2014-05-04 MED ORDER — ACETAMINOPHEN 650 MG RE SUPP: 650.0000 mg | Freq: Four times a day (QID) | RECTAL | Status: DC | PRN

## 2014-05-04 MED ORDER — MIDAZOLAM HCL 2 MG/2ML IJ SOLN
INTRAMUSCULAR | Status: AC
  Filled 2014-05-04: qty 2

## 2014-05-04 MED ORDER — FENTANYL CITRATE 0.05 MG/ML IJ SOLN: 100.0000 ug | INTRAMUSCULAR | Status: DC | PRN

## 2014-05-04 MED ORDER — PROPOFOL 10 MG/ML IV EMUL
INTRAVENOUS | Status: AC
  Filled 2014-05-04: qty 100

## 2014-05-04 MED ORDER — FENTANYL CITRATE 0.05 MG/ML IJ SOLN
100.0000 ug | INTRAMUSCULAR | Status: DC | PRN
  Administered 2014-05-05 – 2014-05-10 (×24): 100 ug via INTRAVENOUS
  Filled 2014-05-04 (×27): qty 2

## 2014-05-04 MED ORDER — VITAL HIGH PROTEIN PO LIQD
1000.0000 mL | ORAL | Status: DC
  Administered 2014-05-04 – 2014-05-09 (×3): 1000 mL
  Filled 2014-05-04 (×7): qty 1000

## 2014-05-04 MED ORDER — POTASSIUM CHLORIDE 20 MEQ PO PACK
40.0000 meq | PACK | ORAL | Status: DC
  Filled 2014-05-04 (×2): qty 2

## 2014-05-04 MED ORDER — SODIUM CHLORIDE 0.9 % IV SOLN: INTRAVENOUS | Status: DC

## 2014-05-04 MED ORDER — FENTANYL CITRATE 0.05 MG/ML IJ SOLN
INTRAMUSCULAR | Status: AC
  Filled 2014-05-04: qty 2

## 2014-05-04 MED ORDER — PRO-STAT SUGAR FREE PO LIQD
60.0000 mL | Freq: Four times a day (QID) | ORAL | Status: DC
  Administered 2014-05-04 – 2014-05-10 (×23): 60 mL
  Filled 2014-05-04 (×27): qty 60

## 2014-05-04 MED ORDER — ETOMIDATE 2 MG/ML IV SOLN
10.0000 mg | Freq: Once | INTRAVENOUS | Status: AC
  Administered 2014-05-04: 10 mg via INTRAVENOUS

## 2014-05-04 MED ORDER — ACETAMINOPHEN 325 MG PO TABS
650.0000 mg | ORAL_TABLET | Freq: Four times a day (QID) | ORAL | Status: DC | PRN
  Administered 2014-05-04 – 2014-05-17 (×8): 650 mg via ORAL
  Filled 2014-05-04 (×9): qty 2

## 2014-05-04 MED ORDER — CHLORHEXIDINE GLUCONATE 0.12 % MT SOLN
15.0000 mL | Freq: Two times a day (BID) | OROMUCOSAL | Status: DC
  Administered 2014-05-04 – 2014-05-17 (×26): 15 mL via OROMUCOSAL
  Filled 2014-05-04 (×28): qty 15

## 2014-05-04 MED ORDER — PROPOFOL 10 MG/ML IV EMUL
5.0000 ug/kg/min | INTRAVENOUS | Status: DC
  Administered 2014-05-04: 30 ug/kg/min via INTRAVENOUS
  Administered 2014-05-04: 20 ug/kg/min via INTRAVENOUS
  Administered 2014-05-05: 5 ug/kg/min via INTRAVENOUS
  Administered 2014-05-05: 10 ug/kg/min via INTRAVENOUS
  Filled 2014-05-04 (×5): qty 100

## 2014-05-04 MED ORDER — ENOXAPARIN SODIUM 30 MG/0.3ML ~~LOC~~ SOLN
30.0000 mg | SUBCUTANEOUS | Status: DC
  Administered 2014-05-04 – 2014-05-08 (×5): 30 mg via SUBCUTANEOUS
  Filled 2014-05-04 (×6): qty 0.3

## 2014-05-04 MED ORDER — SODIUM CHLORIDE 0.9 % IV SOLN
INTRAVENOUS | Status: DC
  Administered 2014-05-04: 2.1 [IU]/h via INTRAVENOUS
  Filled 2014-05-04 (×2): qty 2.5

## 2014-05-04 NOTE — Consult Note (Signed)
PULMONARY / CRITICAL CARE MEDICINE   Name: [name] MRN: [id number] DOB: 06/12/1945    ADMISSION DATE:  04/27/2014 CONSULTATION DATE:  05/04/2014   REFERRING MD :  TRiad hospitalist  CHIEF COMPLAINT:  Acute encephalopathy, Sepsis, Circyulatory shock, Stroke, hypernatremia  HPI: Hx from Dr Roda ShuttersXu of neuro  Aadmit 04/17/14 through 04/25/14 3 weeks ago to [place] due to SEpsis due to CAP NOS RLL on CT, HONK, A Fib RVR, rhabdo (CPK 1700s) , AKI (creat 2.7), Lactic acidosis, demand NSTEMI . AT discharge had mild hypernatermia to 154 , and then dc to snf where Acute encphalopathy present. Back at 04/27/2014 - Na 162 and strroke L MCA with UTI. Na slowly corrected and then Na up due to NPO for TEE. Also new fever since 05/03/14 and Na now 166, BP 73/d, and worsening enceph and obutnddation. PCCM taking over primary 04/27/2014    SIGNIFICANT EVENTS: 04/27/2014 - admit 05/03/14 - ICU transfer 05/04/14 - PCCM primary. Intubation    HISTORY OF PRESENT ILLNESS:     Aadmit 04/17/14 through 04/25/14 3 weeks ago to [place] due to SEpsis due to CAP NOS RLL on CT, HONK, A Fib RVR, rhabdo (CPK 1700s) , AKI (creat 2.7), Lactic acidosis, demand NSTEMI . AT discharge had mild hypernatermia to 154 , and then dc to snf where Acute encphalopathy present. Back at 04/27/2014 - Na 162 and strroke L MCA with UTI. Na slowly corrected and then Na up due to NPO for TEE. Also new fever since 05/03/14 and Na now 166, BP 73/d, and worsening enceph. PCCM taking over primary 04/27/2014  PAST MEDICAL HISTORY :   has a past medical history of Thyroid disease; Rhabdomyolysis (04/18/2014); Hyperosmolar non-ketotic state in patient with type 2 diabetes mellitus (04/17/2014); AKI (acute kidney injury) (04/18/2014); Sepsis (04/18/2014); Atrial fibrillation (04/27/2014); and Diabetes mellitus, type 2 (04/27/2014).  has past surgical history that includes Cholecystectomy. Prior to Admission medications   Medication Sig Start Date End Date  Taking? Authorizing Provider  acetaminophen (TYLENOL) 325 MG tablet Take 2 tablets (650 mg total) by mouth every 6 (six) hours as needed for mild pain (or Fever >/= 101). 04/25/14  Yes Alison MurrayAlma M Devine, MD  aspirin 81 MG chewable tablet Chew 1 tablet (81 mg total) by mouth daily. 04/25/14  Yes Alison MurrayAlma M Devine, MD  diphenoxylate-atropine (LOMOTIL) 2.5-0.025 MG per tablet Take 1 tablet by mouth 4 (four) times daily as needed for diarrhea or loose stools. 04/25/14  Yes Alison MurrayAlma M Devine, MD  furosemide (LASIX) 20 MG tablet Take 1 tablet (20 mg total) by mouth daily. 04/25/14  Yes Alison MurrayAlma M Devine, MD  insulin aspart (NOVOLOG) 100 UNIT/ML injection Inject 4 Units into the skin 3 (three) times daily with meals. 04/25/14  Yes Alison MurrayAlma M Devine, MD  insulin aspart (NOVOLOG) 100 UNIT/ML injection Inject 0-15 Units into the skin 3 (three) times daily with meals. Patient taking differently: Inject 5 Units into the skin 3 (three) times daily with meals. 5 units if greater than 150 04/25/14  Yes Alison MurrayAlma M Devine, MD  insulin glargine (LANTUS) 100 UNIT/ML injection Inject 0.1 mLs (10 Units total) into the skin at bedtime. 04/25/14  Yes Alison MurrayAlma M Devine, MD  metoprolol (LOPRESSOR) 50 MG tablet Take 1 tablet (50 mg total) by mouth 2 (two) times daily. 04/25/14  Yes Alison MurrayAlma M Devine, MD  ondansetron (ZOFRAN) 4 MG tablet Take 1 tablet (4 mg total) by mouth every 6 (six) hours as needed for nausea. 04/25/14  Yes Wendi MayaAlma M  Elisabeth Pigeon, MD  polyethylene glycol (MIRALAX / GLYCOLAX) packet Take 17 g by mouth daily as needed for mild constipation. 04/25/14  Yes [name], MD  terbinafine (LAMISIL) 1 % cream Apply topically 2 (two) times daily. 04/25/14  Yes [name], MD  Alum & Mag Hydroxide-Simeth (MAGIC MOUTHWASH W/LIDOCAINE) SOLN Take 2 mLs by mouth 4 (four) times daily. Patient not taking: Reported on 04/27/2014 04/25/14   [name], MD  diazepam (VALIUM) 5 MG/ML injection Inject 1 mL (5 mg total) into the vein every 4 (four) hours as  needed. For anxiety 05/01/14   Tiffany L Reed, DO  diazepam (VALIUM) 5 MG/ML injection Inject 1 mL (5 mg total) into the vein every 4 (four) hours as needed. For anxiety 05/01/14   Tiffany L Reed, DO   Allergies  Allergen Reactions  . Morphine And Related     FAMILY HISTORY:  has no family status information on file.  SOCIAL HISTORY:  reports that she has never smoked. She does not have any smokeless tobacco history on file. She reports that she does not drink alcohol or use illicit drugs.  REVIEW OF SYSTEMS:  Unable to eliciti    VITAL SIGNS: Temp:  [100.5 F (38.1 C)-102.7 F (39.3 C)] 101 F (38.3 C) (01/07 0752) Pulse Rate:  [42-130] 96 (01/07 1000) Resp:  [24-35] 34 (01/07 1000) BP: (81-124)/(41-100) 81/45 mmHg (01/07 1000) SpO2:  [92 %-98 %] 96 % (01/07 1000) Weight:  [108.9 kg (240 lb 1.3 oz)] 108.9 kg (240 lb 1.3 oz) (01/07 0600) HEMODYNAMICS:   VENTILATOR SETTINGS:   INTAKE / OUTPUT:  Intake/Output Summary (Last 24 hours) at 05/04/14 1043 Last data filed at 05/04/14 1000  Gross per 24 hour  Intake   4010 ml  Output   1250 ml  Net   2760 ml    PHYSICAL EXAMINATION: General:  Obese, critically ill looking Neuro:  RASS -4 equivalent, Poor Gag HEENT:  Mouth open Cardiovascular:  Tachycardic, Hypotensive,  Lungs:  Tachypneic, paradoxical, Coarse Abdomen:  Obesest, Soft Musculoskeletal:  Scattered skin bruises, RT Sided PICC + GU - dirty urine in fole Skin:  Intact anteriorly  LABS: PULMONARY  Recent Labs Lab 05/03/14 0510  PHART 7.441  PCO2ART 43.4  PO2ART 58.0*  HCO3 29.1*  TCO2 30.4  O2SAT 88.6    CBC  Recent Labs Lab 05/02/14 0410 05/03/14 0401 05/04/14 0345  HGB 11.1* 11.9* 9.9*  HCT 36.5 38.8 33.2*  WBC 13.8* 13.5* 15.5*  PLT 151 174 151    COAGULATION No results for input(s): INR in the last 168 hours.  CARDIAC   Recent Labs Lab 04/27/14 1504  TROPONINI 0.06*   No results for input(s): PROBNP in the last 168  hours.   CHEMISTRY  Recent Labs Lab 04/29/14 0350  05/01/14 0635 05/02/14 0410 05/03/14 0401 05/03/14 1700 05/04/14 0345  NA 167*  < > 161* 162* 164* 166* 166*  K 3.1*  < > 3.0* 3.4* 4.5 3.3* 3.2*  CL >130*  < > 126* 125* >130* >130* 129*  CO2 26  < > 30 28 25 28  32  GLUCOSE 145*  < > 182* 170* 196* 180* 190*  BUN 17  < > 15 19 31* 37* 37*  CREATININE 1.96*  < > 1.82* 1.83* 2.16* 2.30* 2.23*  CALCIUM 8.1*  < > 8.1* 8.2* 8.4 8.2* 8.1*  MG 1.8  --   --   --   --   --   --   < > =  values in this interval not displayed. Estimated Creatinine Clearance: 28.7 mL/min (by C-G formula based on Cr of 2.23).   LIVER  Recent Labs Lab 04/27/14 1504 05/04/14 0345  AST 33 128*  ALT 17 38*  ALKPHOS 315* 635*  BILITOT 0.7 0.6  PROT 6.9 5.7*  ALBUMIN 2.5* 1.8*     INFECTIOUS  Recent Labs Lab 04/27/14 1515  LATICACIDVEN 2.52*     ENDOCRINE CBG (last 3)   Recent Labs  05/03/14 2342 05/04/14 0444 05/04/14 0755  GLUCAP 160* 167* 211*         IMAGING x48h Dg Chest 2 View  05/02/2014   CLINICAL DATA:  Labored breathing, fever and altered mental status. Left-sided weakness. Acute kidney disease.  EXAM: CHEST  2 VIEW  COMPARISON:  04/28/2014  FINDINGS: The enteric tube courses into the region of the stomach and off the inferior portion of the film as tip is not well seen. There has been placement of a right-sided PICC line with tip overlying the region of the SVC.  Lungs are adequately inflated with mild infrahilar vascular crowding. No focal consolidation or effusion. Cardiomediastinal silhouette and remainder of the exam is unchanged.  IMPRESSION: No active cardiopulmonary disease.  Tubes and lines as described.   Electronically Signed   By: Elberta Fortis M.D.   On: 05/02/2014 11:10   Ct Head Wo Contrast  05/02/2014   CLINICAL DATA:  Altered mental status, stroke, followup, atrial fibrillation, diabetes type 2  EXAM: CT HEAD WITHOUT CONTRAST  TECHNIQUE: Contiguous axial  images were obtained from the base of the skull through the vertex without intravenous contrast.  COMPARISON:  04/27/2014  FINDINGS: Generalized atrophy.  Normal ventricular morphology.  No midline shift or mass effect.  Subacute large infarct involving the LEFT temporoparietal region extending posteriorly to the LEFT occipital lobe.  No intracranial hemorrhage, mass lesion or additional infarcts identified.  No definite extra-axial fluid collections.  Abnormalities the RIGHT temporal lobe may be related to volume-averaging at the sylvian fissure in no definite RIGHT temporal infarct is seen.  Feeding tube versus nasogastric tube traverses RIGHT nasal passages.  Bones and sinuses otherwise unremarkable.  IMPRESSION: Atrophy with again identified subacute infarct involving the LEFT temporoparietal region extending into the occipital lobe.  No new intracranial abnormalities.   Electronically Signed   By: Ulyses Southward M.D.   On: 05/02/2014 16:55   Dg Chest Port 1 View  05/04/2014   CLINICAL DATA:  69 year old female with fever. Altered mental status, recent sepsis and rhabdomyolysis. Initial encounter.  EXAM: PORTABLE CHEST - 1 VIEW  COMPARISON:  05/03/2014 and earlier.  FINDINGS: Portable AP semi upright view at at 0844 hrs. Stable right PICC line. Enteric feeding tube courses to the left abdomen, tip not included. The patient is less rotated today. Stable cardiac size and mediastinal contours. Confluent bibasilar opacity with obscuration of the diaphragm suggestive of bilateral effusions and lower lobe collapse/consolidation. No pneumothorax. No definite pulmonary edema.  IMPRESSION: 1.  Stable lines and tubes. 2. Bilateral pleural effusions with lower lobe collapse/consolidation.   Electronically Signed   By: Augusto Gamble M.D.   On: 05/04/2014 09:36   Dg Chest Port 1 View  05/03/2014   CLINICAL DATA:  Fever tonight with some chest congestion.  EXAM: PORTABLE CHEST - 1 VIEW  COMPARISON:  05/02/2014  FINDINGS:  Technically limited study due to patient positioning and probable kyphosis. Shallow inspiration. Mild cardiac enlargement without significant vascular congestion. Developing linear atelectasis or infiltration in the  right lung base. No apparent pneumothorax. PICC line and enteric tubes remain unchanged in position.  IMPRESSION: Shallow inspiration with linear atelectasis in the right lung base. Appliances are unchanged in position.   Electronically Signed   By: Burman Nieves M.D.   On: 05/03/2014 03:45        ASSESSMENT / PLAN:  PULMONARY OETT* 05/04/2014  A: acute resp failure to encephalopathy and congested cxr P:   Intubate PRVC VAP protocol  CARDIOVASCULAR CVLRt UE picc A: Circulatory shock - ? Due to dehydration fron high Na v sepsis v both P:  Free water with levophed  RENAL  A:  Hypernatremia severe and Acute Renal Failure P:   Free water with levophed   GASTROINTESTINAL A:  Mental status precludes diet P:   NPO Tube feeds  HEMATOLOGIC   A:  Anemia of critical illness P:  PRBC for hgb < 7gm% Lovenox for dvt proph  INFECTIOUS 12.31.16 - MRSA PCR - neg 1./1 - blodo culture - negative 04/30/14 - urine culture - e colii - sensit to ceftriaxone 05/03/14 - blood negative 05/03/14 - MRSA PCR - negative  A:  UTI septic shock +/- HCAP  P:   vanc zosyn   ENDOCRINE A:  DM  P:   ICU hyperglycemia protocol  NEUROLOGIC A:  COmatose due to hypernatremia. REcent stroke P:   Fent prn for sedation RASS goal 0 (currently -4) Neuro helping  FAMILY  - Updates:  Bedside - friend updated. Acts as surrogate for family  - Inter-disciplinary family meet or Palliative Care meeting due by:  05/11/14        The patient is critically ill with multiple organ systems failure and requires high complexity decision making for assessment and support, frequent evaluation and titration of therapies, application of advanced monitoring technologies and extensive  interpretation of multiple databases.   Critical Care Time devoted to patient care services described in this note is  45  Minutes. This time reflects time of care of this signee Dr Kalman Shan. This critical care time does not reflect procedure time, or teaching time or supervisory time of PA/NP/Med student/Med Resident etc but could involve care discussion time    Dr. Kalman Shan, M.D., [facility].C.P Pulmonary and Critical Care Medicine Staff Physician [facility] [place] [facility]monary and Critical Care Pager: [phone], If no answer or between  15:00h - 7:00h: call [phone]  05/04/2014 10:47 AM

## 2014-05-04 NOTE — Progress Notes (Signed)
[facility]                                                                              Patient Demographics  [name], is a 69 y.o. female, DOB - [birth year], ZOX:[id number]  Admit date - 04/27/2014   Admitting Physician Maryruth Bun Rama, MD  Outpatient Primary MD for the patient is No primary care provider on file.  LOS - 7   Chief Complaint  Patient presents with  . Altered Mental Status      Admission history of present illness/brief summary: [name] is an 69 y.o. female with a PMH of atrial fibillation, recently hospitalized 04/17/14-04/25/14 for treatment of sepsis, pneumonia, rhabdomyolysis, and demand ischemia from afib w/ RVR. She was also hypernatremic at discharge with a sodium of 154 and a creatinine of 1.74. She was discharged on lasix. According to notes, she was back to her normal baseline mental status at discharge. She had routine labs done, for hospital follow up of electrolytes, and her sodium had increased to 169. Her creatinine is 1.66. Her mental status has deteriorated and she has been weak , according to family, who visited her  at her facility to have lunch with her. The patient is very confused and unable to provide any additional information. Denies pain, shortness of breath and cough. Denies nausea and vomiting.  Patient had CT head done which did show evidence of CVA, MRI still pending given patient aditation, plan  for TEE on 1/6 to look for embolic source was cancelled as patient was in respiratory distress, rapid response was called on 1/6 AM secondary to respiratory distress, tachypnea, hypoxia, initially on nonrebreather mask, later tapered to 4 L nasal cannula, patient was transferred to neurological ICU.   Assessment & Plan   Acute  Encephalopathy/?Bilateral MCA territory Infarcts -CT head: moderate region of decreased density in left parietal-occipital lobe -MRI brain pending -Neurology consulted and appreciated -Echocardiogram  from 12/22: Normal EF -Speech, PT, OT consulted -Speech recommended NPO and alternate means of nutrition, started on tube feeds . -Neurology recommended TEE , plan for TEE on 1/6 is postponed given patient critical condition. -Right carotid doppler: 1-39% ICA stenosis, unable to image left due to patient noncompliance -Lower ext doppler: negative for DVT or SVT -Blood cultures on admission negative to date   Sepsis - Patient has fever, leukocytosis, source  questionable infiltrate versus atelectasis in the right lung, HCAP/aspiration PNA ??. As well has mild UTI. - Starting on IV vancomycin and Zosyn. - Follow on blood cultures. - Patient is hypotensive this a.m. requiring fluid bolus, transitioned to PCCM service.  Acute encephalopathy - most likely metabolic given her hypernatremia, sepsis and CVA.  UTI -Urine culture shows Escherichia coli, sensitive to Rocephin and Zosyn. - ceftriaxone transitioned to Zosyn 05/03/14  Hypernatremia/Hyperchloremia -Likely secondary to dehydration and poor oral intake complicated by diuretics -Lasix held -Will start free water via tube,neurology prefer no hypotonic solutions given her acute CVA. -Continue to monitor BMP  Hypokalemia -Replaced  Weakness -Cannot assess at this time   Acute renal failure - Continue to worsen, most likely due to volume depletion, continue with free water via NG tube.  Intertigo Dermatitis -  Continue lamisil  Atrial fibrillation -Currently rate controlled -Continue metoprolol and aspirin  Diabetes mellitus, Type 2 -Continue lantus, ISS and CBG monitoring  Chronic kidney disease, stage III -Cr at baseline, continue to monitor BMP  Nutritional status -IR placed tube for feedings -nutrition consulted  Code Status: Full  Family Communication: None at bedside,   Disposition Plan: Admitted/ transferred to   [facility] service, discussed with Dr. Marchelle Gearing.  Time Spent in minutes   30 minutes  Procedures    None  Consults   Neurology Cardiology  DVT Prophylaxis  Lovenox, would change to subcutaneous heparin if creatininecontinued to increase.  Lab Results  Component Value Date   PLT 151 05/04/2014    Medications  Scheduled Meds: . antiseptic oral rinse  7 mL Mouth Rinse BID  . aspirin  325 mg Oral Daily   Or  . aspirin  300 mg Rectal Daily  . enoxaparin (LOVENOX) injection  40 mg Subcutaneous Q24H  . free water  300 mL Per Tube Q3H  . Influenza vac split quadrivalent PF  0.5 mL Intramuscular Tomorrow-1000  . insulin aspart  0-9 Units Subcutaneous Q4H  . piperacillin-tazobactam (ZOSYN)  IV  3.375 g Intravenous 3 times per day  . potassium chloride  40 mEq Oral Q4H  . terbinafine   Topical BID  . [START ON 05/05/2014] vancomycin  1,500 mg Intravenous Q48H   Continuous Infusions: . feeding supplement (GLUCERNA 1.2 CAL) 1,000 mL (05/04/14 0329)   PRN Meds:.[DISCONTINUED] acetaminophen **OR** acetaminophen, [DISCONTINUED] ondansetron **OR** ondansetron (ZOFRAN) IV  Antibiotics    Anti-infectives    Start     Dose/Rate Route Frequency Ordered Stop   05/05/14 1045  vancomycin (VANCOCIN) 1,500 mg in sodium chloride 0.9 % 500 mL IVPB     1,500 mg250 mL/hr over 120 Minutes Intravenous Every 48 hours 05/03/14 1039     05/03/14 2200  piperacillin-tazobactam (ZOSYN) IVPB 3.375 g     3.375 g12.5 mL/hr over 240 Minutes Intravenous 3 times per day 05/03/14 1039     05/03/14 1045  piperacillin-tazobactam (ZOSYN) IVPB 3.375 g     3.375 g100 mL/hr over 30 Minutes Intravenous  Once 05/03/14 1039 05/03/14 1213   05/03/14 1045  vancomycin (VANCOCIN) 2,000 mg in sodium chloride 0.9 % 500 mL IVPB     2,000 mg250 mL/hr over 120 Minutes Intravenous  Once 05/03/14 1039 05/03/14 1350   05/02/14 1015  cefTRIAXone (ROCEPHIN) 2 g in dextrose 5 % 50 mL IVPB - Premix  Status:  Discontinued     2 g100 mL/hr over 30 Minutes Intravenous Every 24 hours 05/02/14 1006 05/03/14 1023      Subjective:   Jamiya  Engelson seen and examined today.  Patient is lethargic, can't provide any history . Objective:   Filed Vitals:   05/04/14 0752 05/04/14 0800 05/04/14 0900 05/04/14 1000  BP:  104/58 87/45 81/45   Pulse:  80 91 96  Temp: 101 F (38.3 C)     TempSrc: Axillary     Resp:  25 26 34  Height:      Weight:      SpO2:  97% 98% 96%    Wt Readings from Last 3 Encounters:  05/04/14 108.9 kg (240 lb 1.3 oz)  04/20/14 114.7 kg (252 lb 13.9 oz)     Intake/Output Summary (Last 24 hours) at 05/04/14 1035 Last data filed at 05/04/14 1000  Gross per 24 hour  Intake   4010 ml  Output   1250 ml  Net  2760 ml    Exam  General: Well developed, well nourished, , appears stated age, lethargic,   HEENT: NCAT, mucous membranes dry.`   Cardiovascular: S1 S2 auscultated, irregular  Respiratory: Clear to auscultation bilaterally with equal chest rise, somewhat labored  Abdomen: Soft, obese, nontender, nondistended, + bowel sounds  Extremities: warm dry without cyanosis clubbing, Mittens  Data Review   Micro Results Recent Results (from the past 240 hour(s))  MRSA PCR Screening     Status: None   Collection Time: 04/27/14  7:04 PM  Result Value Ref Range Status   MRSA by PCR NEGATIVE NEGATIVE Final    Comment:        The GeneXpert MRSA Assay (FDA approved for NASAL specimens only), is one component of a comprehensive MRSA colonization surveillance program. It is not intended to diagnose MRSA infection nor to guide or monitor treatment for MRSA infections.   Culture, blood (routine x 2)     Status: None   Collection Time: 04/28/14  9:41 AM  Result Value Ref Range Status   Specimen Description BLOOD RIGHT HAND  Final   Special Requests BOTTLES DRAWN AEROBIC ONLY 2CC  Final   Culture   Final    NO GROWTH 5 DAYS Performed at [facility]    Report Status 05/04/2014 FINAL  Final  Culture, blood (routine x 2)     Status: None   Collection Time: 04/28/14  9:50 AM  Result  Value Ref Range Status   Specimen Description BLOOD LEFT HAND  Final   Special Requests BOTTLES DRAWN AEROBIC ONLY 1CC  Final   Culture   Final    NO GROWTH 5 DAYS Performed at [facility]    Report Status 05/04/2014 FINAL  Final  Culture, Urine     Status: None   Collection Time: 04/30/14 12:36 PM  Result Value Ref Range Status   Specimen Description URINE, CATHETERIZED  Final   Special Requests NONE  Final   Colony Count   Final    >=100,000 COLONIES/ML Performed at [facility]    Culture   Final    ESCHERICHIA COLI Performed at [facility]    Report Status 05/02/2014 FINAL  Final   Organism ID, Bacteria ESCHERICHIA COLI  Final      Susceptibility   Escherichia coli - MIC*    AMPICILLIN >=32 RESISTANT Resistant     CEFAZOLIN <=4 SENSITIVE Sensitive     CEFTRIAXONE <=1 SENSITIVE Sensitive     CIPROFLOXACIN >=4 RESISTANT Resistant     GENTAMICIN <=1 SENSITIVE Sensitive     LEVOFLOXACIN >=8 RESISTANT Resistant     NITROFURANTOIN <=16 SENSITIVE Sensitive     TOBRAMYCIN <=1 SENSITIVE Sensitive     TRIMETH/SULFA >=320 RESISTANT Resistant     PIP/TAZO <=4 SENSITIVE Sensitive     * ESCHERICHIA COLI  Culture, blood (routine x 2)     Status: None (Preliminary result)   Collection Time: 05/03/14  4:01 AM  Result Value Ref Range Status   Specimen Description BLOOD LEFT ARM  Final   Special Requests BOTTLES DRAWN AEROBIC ONLY 2CC  Final   Culture   Final           BLOOD CULTURE RECEIVED NO GROWTH TO DATE CULTURE WILL BE HELD FOR 5 DAYS BEFORE ISSUING A FINAL NEGATIVE REPORT Note: Culture results may be compromised due to an inadequate volume of blood received in culture bottles. Performed at [facility]    Report Status  PENDING  Incomplete  Culture, blood (routine x 2)     Status: None (Preliminary result)   Collection Time: 05/03/14  4:09 AM  Result Value Ref Range Status   Specimen Description BLOOD LEFT FOREARM  Final   Special Requests  BOTTLES DRAWN AEROBIC ONLY 10CC  Final   Culture   Final           BLOOD CULTURE RECEIVED NO GROWTH TO DATE CULTURE WILL BE HELD FOR 5 DAYS BEFORE ISSUING A FINAL NEGATIVE REPORT Note: Culture results may be compromised due to an excessive volume of blood received in culture bottles. Performed at [facility]    Report Status PENDING  Incomplete  MRSA PCR Screening     Status: None   Collection Time: 05/03/14  7:19 AM  Result Value Ref Range Status   MRSA by PCR NEGATIVE NEGATIVE Final    Comment:        The GeneXpert MRSA Assay (FDA approved for NASAL specimens only), is one component of a comprehensive MRSA colonization surveillance program. It is not intended to diagnose MRSA infection nor to guide or monitor treatment for MRSA infections.     Radiology Reports Ct Abdomen Pelvis Wo Contrast  04/18/2014   CLINICAL DATA:  Status post fall; found on floor. Sepsis, with progressive weakness for several weeks. Erythema at the groin and abdominal wall. Leukocytosis. Initial encounter.  EXAM: CT ABDOMEN AND PELVIS WITHOUT CONTRAST  TECHNIQUE: Multidetector CT imaging of the abdomen and pelvis was performed following the standard protocol without IV contrast.  COMPARISON:  Lumbar spine radiograph performed 04/17/2014  FINDINGS: Right lower lobe airspace opacification is compatible with pneumonia.  The liver and spleen are unremarkable in appearance. Scattered stones are seen within the gallbladder. The gallbladder is otherwise unremarkable. The pancreas and adrenal glands are unremarkable.  Nonspecific perinephric stranding is noted bilaterally. Mild right-sided pelvicaliectasis remains within normal limits. There is no evidence of hydronephrosis. No renal or ureteral stones are seen.  No free fluid is identified. The small bowel is unremarkable in appearance. The stomach is within normal limits. No acute vascular abnormalities are seen. Minimal calcification is seen along the abdominal  aorta and its branches.  The appendix is not definitely seen; there is no evidence for appendicitis. The colon is unremarkable in appearance.  A small-to-moderate umbilical hernia is noted, containing only fat.  The bladder is largely decompressed and grossly unremarkable. A large 8.0 cm fibroid is suspected within the uterus. The uterus is mildly enlarged. The ovaries are grossly symmetric. No suspicious adnexal masses are seen. Stop No inguinal lymphadenopathy is seen.  No acute osseous abnormalities are identified.  IMPRESSION: 1. Right lower lobe pneumonia noted; this likely explains the patient's symptoms. 2. Cholelithiasis; gallbladder otherwise unremarkable. 3. Small to moderate umbilical hernia, containing only fat. 4. Mildly enlarged fibroid uterus noted.   Electronically Signed   By: Roanna Raider M.D.   On: 04/18/2014 05:07   Dg Chest 1 View  04/28/2014   CLINICAL DATA:  Fever. History of atrial fibrillation. Patient had a recent hospitalization for sepsis and pneumonia.  EXAM: CHEST - 1 VIEW  COMPARISON:  April 27, 2014  FINDINGS: The heart size and mediastinal contours are stable. The heart size is enlarged. There is mild diffuse increased pulmonary interstitium. There is no focal pneumonia or pleural effusion. The visualized skeletal structures are stable.  IMPRESSION: Mild congestive heart failure.   Electronically Signed   By: Sherian Rein M.D.   On: 04/28/2014  08:38   Dg Chest 2 View  05/02/2014   CLINICAL DATA:  Labored breathing, fever and altered mental status. Left-sided weakness. Acute kidney disease.  EXAM: CHEST  2 VIEW  COMPARISON:  04/28/2014  FINDINGS: The enteric tube courses into the region of the stomach and off the inferior portion of the film as tip is not well seen. There has been placement of a right-sided PICC line with tip overlying the region of the SVC.  Lungs are adequately inflated with mild infrahilar vascular crowding. No focal consolidation or effusion.  Cardiomediastinal silhouette and remainder of the exam is unchanged.  IMPRESSION: No active cardiopulmonary disease.  Tubes and lines as described.   Electronically Signed   By: Elberta Fortis M.D.   On: 05/02/2014 11:10   Dg Chest 2 View  04/17/2014   CLINICAL DATA:  Pt arrived via EMS with report of falling without injury d/t "legs given out" on concrete garage floor since 1300 and was found by spouse ast 1845. EMS reported that CBG was greater than 600, poor appetite, noncompliant with diet d/t noted soft drinks and sweeten tea in house, strong urine smell, and red rash to abd folds. Pt c/o lower back pain, large bruising to left hip and groin, denies any chest complaints, best obtainable images due to pt size and condition  EXAM: CHEST  2 VIEW  COMPARISON:  None.  FINDINGS: There are low lung volumes with patient rotation to the right. The lateral view is limited. There is elevation of the right hemidiaphragm. The aorta appears ectatic and somewhat unfolded. The heart size is normal. Mild atelectasis is present at the right lung base. There is no consolidation, significant pleural effusion or pneumothorax. No acute osseous findings identified.  IMPRESSION: Elevated right hemidiaphragm with associated right basilar atelectasis. No consolidation or significant edema.   Electronically Signed   By: Roxy Horseman M.D.   On: 04/17/2014 21:36   Dg Lumbar Spine 2-3 Views  04/17/2014   CLINICAL DATA:  Fall.  Weakness.  Initial evaluation.  EXAM: LUMBAR SPINE - 2-3 VIEW  COMPARISON:  None.  FINDINGS: Five lumbar type vertebral bodies are present. There is mild levoscoliosis. Vertebral bodies are otherwise normally aligned with preservation of the normal lumbar lordosis.  There is anterior wedging of the L1 and L2 vertebral bodies, favored to be chronic in nature. Vertebral body heights are otherwise preserved. No definite acute fracture listhesis.  No soft tissue abnormality.  Moderate multilevel degenerative disc  disease present within the visualized spine.  IMPRESSION: 1. No definite acute traumatic injury within the lumbar spine. 2. Anterior wedging of the L1 and L2 vertebral bodies, favored to be chronic in nature. Possible acute compression fractures are not entirely excluded. Correlation with site of pain recommended.   Electronically Signed   By: Rise Mu M.D.   On: 04/17/2014 21:37   Dg Pelvis 1-2 Views  04/17/2014   CLINICAL DATA:  Patient fell on concrete floor earlier in the day  EXAM: PELVIS - 1-2 VIEW  COMPARISON:  None.  FINDINGS: There is no evidence of pelvic fracture or dislocation. 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 No erosive change.  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 No fracture or dislocation.   Electronically Signed   By: Bretta Bang M.D.   On: 04/17/2014 21:37   Dg Tibia/fibula Left  04/17/2014   CLINICAL DATA:  Status post fall; acute onset of left lower leg pain. Initial encounter.  EXAM: LEFT TIBIA AND FIBULA - 2 VIEW  COMPARISON:  None.  FINDINGS: There is no evidence of fracture or dislocation. The tibia and fibula appear intact.  Marginal osteophytes are seen arising at the medial and lateral compartments of the knee, with wall osteophytes also seen. No knee joint effusion is identified.  The ankle mortise is incompletely assessed, but appears grossly unremarkable. A plantar calcaneal spur is incidentally noted. No significant soft tissue abnormalities are identified.  IMPRESSION: No evidence of fracture or dislocation.   Electronically Signed   By: Roanna Raider M.D.   On: 04/17/2014 22:52   Dg Abd 1 View  05/01/2014   CLINICAL DATA:  Feeding tube placement.  EXAM: ABDOMEN - 1 VIEW  COMPARISON:  None.  FINDINGS: A feeding tube is seen in place. Contrast injection shows feeding tube positioned within the distal duodenum  near the ligament of Treitz.  IMPRESSION: Feeding tube tip in distal duodenum near ligament of Treitz.   Electronically Signed   By: Myles Rosenthal M.D.   On: 05/01/2014 11:01   Ct Head Wo Contrast  05/02/2014   CLINICAL DATA:  Altered mental status, stroke, followup, atrial fibrillation, diabetes type 2  EXAM: CT HEAD WITHOUT CONTRAST  TECHNIQUE: Contiguous axial images were obtained from the base of the skull through the vertex without intravenous contrast.  COMPARISON:  04/27/2014  FINDINGS: Generalized atrophy.  Normal ventricular morphology.  No midline shift or mass effect.  Subacute large infarct involving the LEFT temporoparietal region extending posteriorly to the LEFT occipital lobe.  No intracranial hemorrhage, mass lesion or additional infarcts identified.  No definite extra-axial fluid collections.  Abnormalities the RIGHT temporal lobe may be related to volume-averaging at the sylvian fissure in no definite RIGHT temporal infarct is seen.  Feeding tube versus nasogastric tube traverses RIGHT nasal passages.  Bones and sinuses otherwise unremarkable.  IMPRESSION: Atrophy with again identified subacute infarct involving the LEFT temporoparietal region extending into the occipital lobe.  No new intracranial abnormalities.   Electronically Signed   By: Ulyses Southward M.D.   On: 05/02/2014 16:55   Ct Head Wo Contrast  04/27/2014   CLINICAL DATA:  Altered mental status.  EXAM: CT HEAD WITHOUT CONTRAST  TECHNIQUE: Contiguous axial images were obtained from the base of the skull through the vertex without intravenous contrast.  COMPARISON:  None.  FINDINGS: There is ill-defined decreased density involving the moderate portion of the left posterior parietal occipital lobes extending from the lateral ventricle of the cortex. There is an additional questionable region of decreased density in the right temporal lobe versus volume averaging. There is no associated hemorrhage. There is no midline shift. There is  mild prominence of the extra-axial CSF space on the right, may be related to atrophy versus subdural hygroma. The calvarium is intact. Included paranasal sinuses and mastoid air cells are well aerated.  IMPRESSION: Moderate region of decreased density in the left parietal-occipital lobe concerning for subacute infarct. There is additional questionable region of decreased density in the right temporal lobe. Given the questioned multifocal involvement, MRI of the brain, preferably with contrast, recommended for further evaluation to exclude the possibility of underlying mass lesions.  These results were called by telephone at the time of interpretation on 04/27/2014 at 3:28 pm to Dr. Pricilla Loveless , who verbally acknowledged these results.   Electronically Signed   By: Shawna Orleans  Ehinger M.D.   On: 04/27/2014 15:31   Dg Chest Port 1 View  05/04/2014   CLINICAL DATA:  69 year old female with fever. Altered mental status, recent sepsis and rhabdomyolysis. Initial encounter.  EXAM: PORTABLE CHEST - 1 VIEW  COMPARISON:  05/03/2014 and earlier.  FINDINGS: Portable AP semi upright view at at 0844 hrs. Stable right PICC line. Enteric feeding tube courses to the left abdomen, tip not included. The patient is less rotated today. Stable cardiac size and mediastinal contours. Confluent bibasilar opacity with obscuration of the diaphragm suggestive of bilateral effusions and lower lobe collapse/consolidation. No pneumothorax. No definite pulmonary edema.  IMPRESSION: 1.  Stable lines and tubes. 2. Bilateral pleural effusions with lower lobe collapse/consolidation.   Electronically Signed   By: Augusto Gamble M.D.   On: 05/04/2014 09:36   Dg Chest Port 1 View  05/03/2014   CLINICAL DATA:  Fever tonight with some chest congestion.  EXAM: PORTABLE CHEST - 1 VIEW  COMPARISON:  05/02/2014  FINDINGS: Technically limited study due to patient positioning and probable kyphosis. Shallow inspiration. Mild cardiac enlargement without  significant vascular congestion. Developing linear atelectasis or infiltration in the right lung base. No apparent pneumothorax. PICC line and enteric tubes remain unchanged in position.  IMPRESSION: Shallow inspiration with linear atelectasis in the right lung base. Appliances are unchanged in position.   Electronically Signed   By: Burman Nieves M.D.   On: 05/03/2014 03:45   Dg Chest Port 1 View  04/27/2014   CLINICAL DATA:  Altered mental status.  EXAM: PORTABLE CHEST - 1 VIEW  COMPARISON:  April 17, 2014.  FINDINGS: The heart size and mediastinal contours are within normal limits. Both lungs are clear. No pneumothorax or pleural effusion is noted. The visualized skeletal structures are unremarkable.  IMPRESSION: No acute cardiopulmonary abnormality seen.   Electronically Signed   By: Roque Lias M.D.   On: 04/27/2014 14:43   Dg Vangie Bicker G Tube Plc W/fl-no Rad  05/01/2014   CLINICAL DATA:    NASO G TUBE PLACEMENT WITH FLUORO  Fluoroscopy was utilized by the requesting physician.  No radiographic  interpretation.     CBC  Recent Labs Lab 04/27/14 1504  04/30/14 0429 05/01/14 0635 05/02/14 0410 05/03/14 0401 05/04/14 0345  WBC 18.8*  < > 16.8* 13.6* 13.8* 13.5* 15.5*  HGB 12.2  < > 11.6* 10.7* 11.1* 11.9* 9.9*  HCT 40.0  < > 36.8 34.7* 36.5 38.8 33.2*  PLT 181  < > 152 122* 151 174 151  MCV 95.9  < > 96.6 95.9 95.8 96.8 97.1  MCH 29.3  < > 30.4 29.6 29.1 29.7 28.9  MCHC 30.5  < > 31.5 30.8 30.4 30.7 29.8*  RDW 16.7*  < > 16.8* 16.7* 16.5* 16.9* 16.9*  LYMPHSABS 1.5  --  2.4  --   --   --   --   MONOABS 0.8  --  0.9  --   --   --   --   EOSABS 0.1  --  0.2  --   --   --   --   BASOSABS 0.0  --  0.0  --   --   --   --   < > = values in this interval not displayed.  Chemistries   Recent Labs Lab 04/27/14 1504  04/29/14 0350  05/01/14 0635 05/02/14 0410 05/03/14 0401 05/03/14 1700 05/04/14 0345  NA 166*  < > 167*  < > 161* 162* 164* 166* 166*  K 3.2*  < > 3.1*  < > 3.0*  3.4* 4.5 3.3* 3.2*  CL 128*  < > >130*  < > 126* 125* >130* >130* 129*  CO2 29  < > 26  < > 30 28 25 28  32  GLUCOSE 102*  < > 145*  < > 182* 170* 196* 180* 190*  BUN 17  < > 17  < > 15 19 31* 37* 37*  CREATININE 1.66*  < > 1.96*  < > 1.82* 1.83* 2.16* 2.30* 2.23*  CALCIUM 8.7  < > 8.1*  < > 8.1* 8.2* 8.4 8.2* 8.1*  MG  --   --  1.8  --   --   --   --   --   --   AST 33  --   --   --   --   --   --   --  128*  ALT 17  --   --   --   --   --   --   --  38*  ALKPHOS 315*  --   --   --   --   --   --   --  635*  BILITOT 0.7  --   --   --   --   --   --   --  0.6  < > = values in this interval not displayed. ------------------------------------------------------------------------------------------------------------------ estimated creatinine clearance is 28.7 mL/min (by C-G formula based on Cr of 2.23). ------------------------------------------------------------------------------------------------------------------ No results for input(s): HGBA1C in the last 72 hours. ------------------------------------------------------------------------------------------------------------------ No results for input(s): CHOL, HDL, LDLCALC, TRIG, CHOLHDL, LDLDIRECT in the last 72 hours. ------------------------------------------------------------------------------------------------------------------ No results for input(s): TSH, T4TOTAL, T3FREE, THYROIDAB in the last 72 hours.  Invalid input(s): FREET3 ------------------------------------------------------------------------------------------------------------------ No results for input(s): VITAMINB12, FOLATE, FERRITIN, TIBC, IRON, RETICCTPCT in the last 72 hours.  Coagulation profile No results for input(s): INR, PROTIME in the last 168 hours.  No results for input(s): DDIMER in the last 72 hours.  Cardiac Enzymes  Recent Labs Lab 04/27/14 1504  TROPONINI 0.06*    ------------------------------------------------------------------------------------------------------------------ Invalid input(s): POCBNP  Time spent: 35 minutes.  Ulysses Alper D.O. on 05/04/2014 at 10:35 AM  Between 7am to 7pm - Pager - [phone]  After 7pm go to [email or web address] - password [email or web address]  And look for the night coverage person covering for me after hours  [facility] Office  [phone]

## 2014-05-04 NOTE — Progress Notes (Signed)
SLP Cancellation Note  Patient Details Name: [name] C Iannelli MRN: [id number] DOB: 07/19/1945   Cancelled treatment:        SLP in room with RN, Dr. Roda ShuttersXu and daughter. Per RN pt is unarousable with max stimulation, BP is low. RN stated just completed oral care. At present, no skilled treatment this SLP can provide. SLP encouraged RN to continue aggressive oral care. Will follow up and may need to defer tx when/if pt able to arouse.   Royce MacadamiaLitaker, Chrisean Kloth Willis 05/04/2014, 9:12 AM   Breck CoonsLisa Willis Lonell FaceLitaker M.Ed [employer]-SLP Pager [phone]

## 2014-05-04 NOTE — Progress Notes (Signed)
NUTRITION FOLLOW UP  Intervention:   D/C Glucerna  Initiate Vital High Protein @ 10 ml/hr via nasoenteric feeding tube.   60 ml Prostat QID.    Tube feeding regimen provides 1040 kcal, 141 grams of protein, and 200 ml of H2O.   TF regimen and propofol at current rate providing 1362 total kcal/day (62 % of kcal needs)  Nutrition Dx:   Inadequate oral intake related to AMS as evidenced by NPO status. Ongoing.  Goal:   Intake to meet >90% of estimated nutrition needs; being met  Monitor:   TF tolerance/adequacy, weight trend, labs, swallowing function and ability to begin PO diet.  Assessment:   69 y.o. female with a history of atrial fibrillation and recent hospitalization for sepsis, pneumonia rhabdomyolysis as well as hypernatremia and acute kidney injury, admitted with bilateral MCA embolic infarcts.  Pt failed swallow evaluation and had a nasoenteric feeding tube placed (tip in distal duodenum near ligament of Treitz)  Pt is receiving 300 ml of free water every 3 hours, to provide 2400 ml daily  Labs:  elevated sodium - free water increased elevated glucose - pt on lantus and SSI low potassium - on replacement   Pt intubated and now ready to resume tube feedings. Patient is currently intubated on ventilator support MV: 12.7 L/min Temp (24hrs), Avg:101.6 F (38.7 C), Min:100.5 F (38.1 C), Max:102.5 F (39.2 C) pt with fevers Propofol: 12.2 ml/hr provides: 322 kcal per day from lipid   Height: Ht Readings from Last 1 Encounters:  05/03/14 _0  (1.626 m)    Weight Status:   Wt Readings from Last 1 Encounters:  05/04/14 240 lb 1.3 oz (108.9 kg)  04/29/14 238 lb 04/20/14 252 lb  Re-estimated needs:  Kcal: 2198 Protein: >/= 136 grams Fluid: > 2 L/day  Skin: stage 2 pressure ulcer to buttocks  Diet Order: Diet NPO time specified   Intake/Output Summary (Last 24 hours) at 05/04/14 1449 Last data filed at 05/04/14 1030  Gross per 24 hour  Intake   2900 ml   Output   1050 ml  Net   1850 ml    Last BM: 1/5   Labs:   Recent Labs Lab 04/29/14 0350  05/03/14 0401 05/03/14 1700 05/04/14 0345  NA 167*  < > 164* 166* 166*  K 3.1*  < > 4.5 3.3* 3.2*  CL >130*  < > >130* >130* 129*  CO2 26  < > 25 28 32  BUN 17  < > 31* 37* 37*  CREATININE 1.96*  < > 2.16* 2.30* 2.23*  CALCIUM 8.1*  < > 8.4 8.2* 8.1*  MG 1.8  --   --   --   --   GLUCOSE 145*  < > 196* 180* 190*  < > = values in this interval not displayed.  CBG (last 3)   Recent Labs  05/04/14 0444 05/04/14 0755 05/04/14 1159  GLUCAP 167* 211* 138*    Scheduled Meds: . antiseptic oral rinse  7 mL Mouth Rinse BID  . antiseptic oral rinse  7 mL Mouth Rinse QID  . aspirin  325 mg Oral Daily   Or  . aspirin  300 mg Rectal Daily  . chlorhexidine  15 mL Mouth Rinse BID  . enoxaparin (LOVENOX) injection  30 mg Subcutaneous Q24H  . free water  300 mL Per Tube Q3H  . Influenza vac split quadrivalent PF  0.5 mL Intramuscular Tomorrow-1000  . insulin aspart  1-3 Units Subcutaneous 6 times  per day  . piperacillin-tazobactam (ZOSYN)  IV  3.375 g Intravenous 3 times per day  . propofol      . terbinafine   Topical BID  . [START ON 05/05/2014] vancomycin  1,500 mg Intravenous Q48H    Continuous Infusions: . norepinephrine (LEVOPHED) Adult infusion 20 mcg/min (05/04/14 1200)  . propofol 20 mcg/kg/min (05/04/14 1412)     Pryor Ochoa RD, LDN Inpatient Clinical Dietitian Pager: [phone] After Hours Pager: [phone]

## 2014-05-04 NOTE — Progress Notes (Signed)
CRITICAL VALUE ALERT  Critical value received:  Na+ 166  Date of notification:  05/04/2014   Time of notification:  0430   Critical value read back:Yes.     Nurse who received alert:  Everette RankJennifer Taheera Thomann, RN  MD notified (1st page):  Merdis DelayK. Schorr, NP  Time of first page:  [phone]  MD notified (2nd page):  Time of second page:  Responding MD:  Merdis DelayK. Schorr, NP  Time MD responded:  [phone]

## 2014-05-04 NOTE — Progress Notes (Signed)
Chaplain introduced herself to pt long time friend, Jamesetta Sohyllis, in hallway. Pt friend told me more about her friend, with her giving nature. Pt friend also close with husband. Later pt brother and sister-in-law arrived. Chaplain asked family to wait in waiting area until pt has procedure done. Chaplain informed pt nurse of family whereabouts. Chaplain will continue to follow. Page chaplain as needed.    05/04/14 1100  Clinical Encounter Type  Visited With Patient and family together  Visit Type Follow-up;Spiritual support  Spiritual Encounters  Spiritual Needs Emotional  Stress Factors  Family Stress Factors Health changes;Major life changes  Jamacia Jester, Mayer MaskerCourtney F, Chaplain 05/04/2014 11:28 AM

## 2014-05-04 NOTE — Progress Notes (Signed)
OT Cancellation Note  Patient Details Name: [name] MRN: [id number] DOB: 10/17/1945   Cancelled Treatment:    Reason Eval/Treat Not Completed: Patient not medically ready. Pt with decline in status, now intubated. Please reorder OT as appropriate.  [name], [name] 05/04/2014, 11:52 AM

## 2014-05-04 NOTE — Progress Notes (Addendum)
NUTRITION FOLLOW UP  Intervention:   Continue Glucerna 1.2 at goal rate of 65 ml/h to provide 1872 kcals, 94 gm protein, 1256 ml free water daily. Free water per MD  Nutrition Dx:   Inadequate oral intake related to AMS as evidenced by NPO status. Ongoing.  Goal:   Intake to meet >90% of estimated nutrition needs; being met  Monitor:   TF tolerance/adequacy, weight trend, labs, swallowing function and ability to begin PO diet.  Assessment:   69 y.o. female with a history of atrial fibrillation and recent hospitalization for sepsis, pneumonia rhabdomyolysis as well as hypernatremia and acute kidney injury, admitted with bilateral MCA embolic infarcts.  Pt failed swallow evaluation and had a nasoenteric feeding tube placed (tip in distal duodenum near ligament of Treitz)  Glucerna 1.2 infusing at 65 ml/hr which is providing 1872 kcals, 94 gm protein, 1256 ml free water daily.  Pt is also receiving 300 ml of free water every 3 hours, to provide an additional 2400 ml daily, for a total of 3656 ml daily.  Per RN pt is tolerating tube feedings well; 0 ml gastric residuals.   Labs:  elevated sodium - free water increased elevated glucose - pt on lantus and SSI low potassium - on replacement    Best friend at bedside. Pt discussed during ICU rounds and with RN.  MD to intubate today.   Height: Ht Readings from Last 1 Encounters:  05/03/14 5' 4"  (1.626 m)    Weight Status:   Wt Readings from Last 1 Encounters:  05/04/14 240 lb 1.3 oz (108.9 kg)  04/29/14 238 lb 04/20/14 252 lb  Re-estimated needs:  Kcal: 1700-1950 Protein: 90-100 gm Fluid: 2-2.5 L  Skin: stage 2 pressure ulcer to buttocks  Diet Order: Diet NPO time specified   Intake/Output Summary (Last 24 hours) at 05/04/14 1017 Last data filed at 05/04/14 0800  Gross per 24 hour  Intake   3880 ml  Output   1250 ml  Net   2630 ml    Last BM: 1/5   Labs:   Recent Labs Lab 04/29/14 0350  05/03/14 0401  05/03/14 1700 05/04/14 0345  NA 167*  < > 164* 166* 166*  K 3.1*  < > 4.5 3.3* 3.2*  CL >130*  < > >130* >130* 129*  CO2 26  < > 25 28 32  BUN 17  < > 31* 37* 37*  CREATININE 1.96*  < > 2.16* 2.30* 2.23*  CALCIUM 8.1*  < > 8.4 8.2* 8.1*  MG 1.8  --   --   --   --   GLUCOSE 145*  < > 196* 180* 190*  < > = values in this interval not displayed.  CBG (last 3)   Recent Labs  05/03/14 2342 05/04/14 0444 05/04/14 0755  GLUCAP 160* 167* 211*    Scheduled Meds: . antiseptic oral rinse  7 mL Mouth Rinse BID  . aspirin  325 mg Oral Daily   Or  . aspirin  300 mg Rectal Daily  . enoxaparin (LOVENOX) injection  40 mg Subcutaneous Q24H  . free water  300 mL Per Tube Q3H  . Influenza vac split quadrivalent PF  0.5 mL Intramuscular Tomorrow-1000  . insulin aspart  0-9 Units Subcutaneous Q4H  . piperacillin-tazobactam (ZOSYN)  IV  3.375 g Intravenous 3 times per day  . potassium chloride  40 mEq Oral Q4H  . terbinafine   Topical BID  . [START ON 05/05/2014] vancomycin  1,500  mg Intravenous Q48H    Continuous Infusions: . feeding supplement (GLUCERNA 1.2 CAL) 1,000 mL (05/04/14 0329)     Pryor Ochoa RD, LDN Inpatient Clinical Dietitian Pager: [phone] After Hours Pager: [phone]

## 2014-05-04 NOTE — Progress Notes (Signed)
PT Cancellation Note  Patient Details Name: [name] MRN: [id number] DOB: 08/30/1945   Cancelled Treatment:    Reason Eval/Treat Not Completed: Patient not medically ready.  Pt with respiratory decline today.  Will hold PT at this time.     Caeson Filippi, Alison MurrayMegan F 05/04/2014, 1:28 PM

## 2014-05-04 NOTE — Progress Notes (Signed)
STROKE TEAM PROGRESS NOTE   HISTORY [name] is a 70 y.o. female with a history of atrial fibrillation and recent hospitalization for sepsis, pneumonia rhabdomyolysis as well as hypernatremia and acute kidney injury, brought to the emergency room at [facility] for increased confusion and speech abnormality. Serum sodium was 169. CT scan of her head showed low density areas involving large area of left MCA territory as well as right temporal abnormality, with appearance indicative of probable subacute infarctions. MRI was recommended but could not be obtained because of patient's agitated state. She was transferred here for further evaluation and management with stroke service intervention.  LSN: Unclear tPA Given: No: CT abnormalities is an on presentation, as well as unclear when last known well. mRankin:  SUBJECTIVE (INTERVAL HISTORY) Husband is at bedside. Patient was transferred to ICU services this morning because of the respiratory distress, continued fever, hypotension and worsening hypernatremia. Subsequently, she was intubated for airway protection and respiratory distress. She is on neo now.  OBJECTIVE Temp:  [100.5 F (38.1 C)-102.5 F (39.2 C)] 102.5 F (39.2 C) (01/07 1200) Pulse Rate:  [42-130] 90 (01/07 1124) Cardiac Rhythm:  [-] Atrial fibrillation (01/07 0800) Resp:  [16-35] 16 (01/07 1124) BP: (81-124)/(41-100) 81/45 mmHg (01/07 1000) SpO2:  [92 %-100 %] 100 % (01/07 1124) FiO2 (%):  [100 %] 100 % (01/07 1124) Weight:  [240 lb 1.3 oz (108.9 kg)] 240 lb 1.3 oz (108.9 kg) (01/07 0600)   Recent Labs Lab 05/03/14 1951 05/03/14 2342 05/04/14 0444 05/04/14 0755 05/04/14 1159  GLUCAP 173* 160* 167* 211* 138*    Recent Labs Lab 04/29/14 0350  05/01/14 0635 05/02/14 0410 05/03/14 0401 05/03/14 1700 05/04/14 0345  NA 167*  < > 161* 162* 164* 166* 166*  K 3.1*  < > 3.0* 3.4* 4.5 3.3* 3.2*  CL >130*  < > 126* 125* >130* >130* 129*  CO2 26  < > 32  GLUCOSE 145*  < > 182* 170* 196* 180* 190*  BUN 17  < > 15 19 31* 37* 37*  CREATININE 1.96*  < > 1.82* 1.83* 2.16* 2.30* 2.23*  CALCIUM 8.1*  < > 8.1* 8.2* 8.4 8.2* 8.1*  MG 1.8  --   --   --   --   --   --   < > = values in this interval not displayed.  Recent Labs Lab 04/27/14 1504 05/04/14 0345  AST 33 128*  ALT 17 38*  ALKPHOS 315* 635*  BILITOT 0.7 0.6  PROT 6.9 5.7*  ALBUMIN 2.5* 1.8*    Recent Labs Lab 04/27/14 1504  04/30/14 0429 05/01/14 0635 05/02/14 0410 05/03/14 0401 05/04/14 0345  WBC 18.8*  < > 16.8* 13.6* 13.8* 13.5* 15.5*  NEUTROABS 16.3*  --  13.3*  --   --   --   --   HGB 12.2  < > 11.6* 10.7* 11.1* 11.9* 9.9*  HCT 40.0  < > 36.8 34.7* 36.5 38.8 33.2*  MCV 95.9  < > 96.6 95.9 95.8 96.8 97.1  PLT 181  < > 152 122* 151 174 151  < > = values in this interval not displayed.  Recent Labs Lab 04/27/14 1504  CKTOTAL 43  TROPONINI 0.06*   No results for input(s): LABPROT, INR in the last 72 hours. No results for input(s): COLORURINE, LABSPEC, PHURINE, GLUCOSEU, HGBUR, BILIRUBINUR, KETONESUR, PROTEINUR, UROBILINOGEN, NITRITE, LEUKOCYTESUR in the last 72 hours.  Invalid input(s): APPERANCEUR  Component Value Date/Time   CHOL 101 04/29/2014 0350   TRIG 182* 04/29/2014 0350   HDL 26* 04/29/2014 0350   CHOLHDL 3.9 04/29/2014 0350   VLDL 36 04/29/2014 0350   LDLCALC 39 04/29/2014 0350   Lab Results  Component Value Date   HGBA1C 12.1* 04/29/2014      Component Value Date/Time   LABOPIA POSITIVE* 04/18/2014 1754   COCAINSCRNUR NONE DETECTED 04/18/2014 1754   LABBENZ NONE DETECTED 04/18/2014 1754   AMPHETMU NONE DETECTED 04/18/2014 1754   THCU NONE DETECTED 04/18/2014 1754   LABBARB NONE DETECTED 04/18/2014 1754    No results for input(s): ETH in the last 168 hours.  I have personally reviewed the radiological images below and agree with the radiology interpretations.  Ct Head Wo Contrast 04/27/2014    Moderate region of  decreased density in the left parietal-occipital lobe concerning for subacute infarct. There is additional questionable region of decreased density in the right temporal lobe. Given the questioned multifocal involvement, MRI of the brain, preferably with contrast, recommended for further evaluation to exclude the possibility of underlying mass lesions.   05/02/14  Atrophy with again identified subacute infarct involving the LEFT temporoparietal region extending into the occipital lobe. No new intracranial abnormalities.  MRI not able to be done due to agitation  Dg Chest [facility] 04/27/2014    No acute cardiopulmonary abnormality seen.  DG Chest 1 View 04/28/2014  Mild congestive heart failure 05/02/2014 No active cardiopulmonary disease. 05/03/2014 Shallow inspiration with linear atelectasis in the right lung base. Appliances are unchanged in position. 05/04/2014 1. Stable lines and tubes. 2. Bilateral pleural effusions with lower lobe collapse/consolidation.  2D echo 04/18/14 - Left ventricle: The cavity size was normal. Wall thickness was normal. Systolic function was normal. The estimated ejection fraction was in the range of 55% to 60%. - Left atrium: The atrium was mildly dilated. Impressions: - Overall very poor image quality.  CUS -  Bilateral: 1-39% ICA stenosis. Vertebral artery flow is antegrade.   Venous doppler - There is no obvious evidence of DVT or SVT noted in the bilateral lower extremities.   EEG -  1) Asymmetric PDR. 2) irregular delta activity.  Clinical Interpretation: This EEG is most consistent with a generalized cerebral dysfunction with a superimposed area of cortical dysfunction as evidenced by attenuated voltages in the left posterior quadrant. There was no seizure or seizure predisposition recorded on this study.    PHYSICAL EXAM  Temp:  [100.5 F (38.1 C)-102.5 F (39.2 C)] 102.5 F (39.2 C) (01/07 1200) Pulse Rate:  [42-130] 90 (01/07  1124) Resp:  [16-35] 16 (01/07 1124) BP: (81-124)/(41-100) 81/45 mmHg (01/07 1000) SpO2:  [92 %-100 %] 100 % (01/07 1124) FiO2 (%):  [100 %] 100 % (01/07 1124) Weight:  [240 lb 1.3 oz (108.9 kg)] 240 lb 1.3 oz (108.9 kg) (01/07 0600)  General - morbid obesity, well developed, intubated, eyes not open on stimulation.  Ophthalmologic - not able to test due to agitation.  Cardiovascular - Irregular rate and rhythm.  Neuro - obtunded, intubated with intermittent restless and agitated, not following commands. Possible right facial droop. PERRL, move both upper extremities spontaneously and equally, mild withdraw b/l LEs. Reflex 1+ and no babinski.  ASSESSMENT/PLAN [name] is a 69 y.o. female with history of atrial fibrillation, diabetes mellitus, recent sepsis, presenting with confusion and speech abnormalities . She did not receive IV t-PA due to unknown time of onset.  Stroke: Bilateral MCA  embolic infarcts - etiology not clear but likely due to atrial fibrillation or endocarditis due to sepsis.  Resultant  Confusion/global aphasia  MRI (not yet performed secondary to agitation) - consider MRI once medically stable.  Repeat CAT scan showed stable left MCA stroke.  Carotid Doppler - unremarkable  2D Echo EF 55-60%. Poor images. No cardiac source of emboli identified.  Hold off TEE as patient is not stable. Will consider once patient stabilized.  LE doppler - negative for DVT  LDL 39  HgbA1c 12.1, not at goal  Lovenox for VTE prophylaxis   Diet NPO time specified no liquids, on tube feeding  aspirin 81 mg orally every day prior to admission, now on aspirin 300 mg suppository daily or ASA 325mg  via PANDA  Ongoing aggressive stroke risk factor management  Therapy recommendations:  Pending  Disposition:  Pending  Respiratory failure  Increased WOB  CXR showed bilateral pleural effusions with lower lobe collapse/consolidation  Intubated and transfer to CCM  serivces  On vanco and zosyn  CCM on board  ? Septic shock  Febrile   Low BP and given bolus and on neo now  CXR showed lower lung consolidation  On vanco and zosyn  Blood culture NGTD  Leukocytosis - WBC's 21.1->16.8->13.6->15.5  hypernatremia - due to dehydration.  - Na 166 today.  - continue free water 300ml Q3h  - Na Q8 - CCM on board - EEG no seizure  UTI - on vanco and zosyn - Culture no growth to date - repeat UA  Hx of Afib - on documentation - family stated that they have not being told about hx of Afib - if comfirmed, she need life long anticoagulation - continue tele - multiple runs of probable MAT in the 100-160 range  - may consider cardiology consult  Diabetes  HgbA1c 12.1 goal < 7.0  Uncontrolled  SSI  Close monitoring  Nutrition  S/p PANDA   On tube feeding  On free water  Other Stroke Risk Factors  Advanced age  Obesity, Body mass index is 41.19 kg/(m^2).   Other Active Problems  Hypokalemia - supplement, 3.2 today  Elevated creatinine 2.23 today  Other Pertinent History  No regular medical follow-up for 17 years.   Hospital day # 7  This patient is critically ill due to respiratory failure, septic shock, hypernatremia, high grade fever and at significant risk of neurological worsening, death from brain edema, respiratory failure, sepsis. This patient's care requires constant monitoring of vital signs, hemodynamics, respiratory and cardiac monitoring, review of multiple databases, neurological assessment, other specialists and medical decision making of high complexity. I have updated husband at the bedside. spent 50 minutes of neurocritical care time in the care of this patient.  Marvel PlanJindong Sami Roes, MD PhD Stroke Neurology 05/04/2014 1:53 PM    To contact Stroke Continuity provider, please refer to [email or web address]. After hours, contact General Neurology

## 2014-05-04 NOTE — Procedures (Signed)
Intubation Procedure Note Wallie Renshawlma C [place] [id number] 12/13/1945  Procedure: Intubation Indications: Airway protection and maintenance  Procedure Details Consent: Risks of procedure as well as the alternatives and risks of each were explained to the (patient/caregiver).  Consent for procedure obtained. Time Out: Verified patient identification, verified procedure, site/side was marked, verified correct patient position, special equipment/implants available, medications/allergies/relevent history reviewed, required imaging and test results available.  Performed  MAC and 3 Medications:  Fentanyl  Etomidate 10 mg Versed  NMB 50 mg zemuron    Evaluation Hemodynamic Status: BP stable throughout; O2 sats: stable throughout Patient's Current Condition: stable Complications: No apparent complications Patient did tolerate procedure well. Chest X-ray ordered to verify placement.  CXR: pending.   Brett CanalesSteve Johnel Yielding ACNP [facility] PCCM Pager [phone] till 3 pm If no answer page [phone] 05/04/2014, 10:55 AM

## 2014-05-04 NOTE — Procedures (Signed)
Central Venous Catheter Insertion Procedure Note Wallie Renshawlma C [place] [id number] 02/19/1946  Procedure: Insertion of Central Venous Catheter Indications: Assessment of intravascular volume, Drug and/or fluid administration and Frequent blood sampling  Procedure Details Consent: Risks of procedure as well as the alternatives and risks of each were explained to the (patient/caregiver).  Consent for procedure obtained. Time Out: Verified patient identification, verified procedure, site/side was marked, verified correct patient position, special equipment/implants available, medications/allergies/relevent history reviewed, required imaging and test results available.  Performed  Maximum sterile technique was used including antiseptics, cap, gloves, gown, hand hygiene, mask and sheet. Skin prep: Chlorhexidine; local anesthetic administered A antimicrobial bonded/coated triple lumen catheter was placed in the left internal jugular vein using the Seldinger technique. Ultrasound guidance used.Yes.   Catheter placed to 20 cm. Blood aspirated via all 3 ports and then flushed x 3. Line sutured x 2 and dressing applied.  Evaluation Blood flow good Complications: No apparent complications Patient did tolerate procedure well. Chest X-ray ordered to verify placement.  CXR: pending.  Brett CanalesSteve Minor ACNP [facility] PCCM Pager [phone] till 3 pm If no answer page [phone] 05/04/2014, 11:43 AM

## 2014-05-05 ENCOUNTER — Inpatient Hospital Stay (HOSPITAL_COMMUNITY): Payer: Medicare Other

## 2014-05-05 DIAGNOSIS — A499 Bacterial infection, unspecified: Secondary | ICD-10-CM

## 2014-05-05 DIAGNOSIS — R579 Shock, unspecified: Secondary | ICD-10-CM

## 2014-05-05 DIAGNOSIS — R6521 Severe sepsis with septic shock: Secondary | ICD-10-CM

## 2014-05-05 DIAGNOSIS — A419 Sepsis, unspecified organism: Secondary | ICD-10-CM

## 2014-05-05 DIAGNOSIS — N390 Urinary tract infection, site not specified: Secondary | ICD-10-CM

## 2014-05-05 LAB — BASIC METABOLIC PANEL
ANION GAP: 4 — AB (ref 5–15)
BUN: 43 mg/dL — AB (ref 6–23)
CALCIUM: 7.8 mg/dL — AB (ref 8.4–10.5)
CO2: 27 mmol/L (ref 19–32)
Chloride: 124 mEq/L — ABNORMAL HIGH (ref 96–112)
Creatinine, Ser: 2.4 mg/dL — ABNORMAL HIGH (ref 0.50–1.10)
GFR calc non Af Amer: 19 mL/min — ABNORMAL LOW (ref >90)
GFR, EST AFRICAN AMERICAN: 23 mL/min — AB (ref >90)
Glucose, Bld: 126 mg/dL — ABNORMAL HIGH (ref 70–99)
Potassium: 3.5 mmol/L (ref 3.5–5.1)
Sodium: 155 mmol/L — ABNORMAL HIGH (ref 135–145)

## 2014-05-05 LAB — GLUCOSE, CAPILLARY
GLUCOSE-CAPILLARY: 265 mg/dL — AB (ref 70–99)
GLUCOSE-CAPILLARY: 273 mg/dL — AB (ref 70–99)
GLUCOSE-CAPILLARY: 273 mg/dL — AB (ref 70–99)
GLUCOSE-CAPILLARY: 165 mg/dL — AB (ref 70–99)
GLUCOSE-CAPILLARY: 137 mg/dL — AB (ref 70–99)
GLUCOSE-CAPILLARY: 119 mg/dL — AB (ref 70–99)
GLUCOSE-CAPILLARY: 148 mg/dL — AB (ref 70–99)
GLUCOSE-CAPILLARY: 178 mg/dL — AB (ref 70–99)
Glucose-Capillary: 110 mg/dL — ABNORMAL HIGH (ref 70–99)
Glucose-Capillary: 117 mg/dL — ABNORMAL HIGH (ref 70–99)
Glucose-Capillary: 133 mg/dL — ABNORMAL HIGH (ref 70–99)
Glucose-Capillary: 157 mg/dL — ABNORMAL HIGH (ref 70–99)
Glucose-Capillary: 158 mg/dL — ABNORMAL HIGH (ref 70–99)
Glucose-Capillary: 165 mg/dL — ABNORMAL HIGH (ref 70–99)
Glucose-Capillary: 171 mg/dL — ABNORMAL HIGH (ref 70–99)
Glucose-Capillary: 178 mg/dL — ABNORMAL HIGH (ref 70–99)
Glucose-Capillary: 179 mg/dL — ABNORMAL HIGH (ref 70–99)
Glucose-Capillary: 194 mg/dL — ABNORMAL HIGH (ref 70–99)
Glucose-Capillary: 199 mg/dL — ABNORMAL HIGH (ref 70–99)
Glucose-Capillary: 246 mg/dL — ABNORMAL HIGH (ref 70–99)
Glucose-Capillary: 308 mg/dL — ABNORMAL HIGH (ref 70–99)

## 2014-05-05 LAB — HEPATIC FUNCTION PANEL
ALT: 25 U/L (ref 0–35)
AST: 46 U/L — AB (ref 0–37)
Albumin: 1.6 g/dL — ABNORMAL LOW (ref 3.5–5.2)
Alkaline Phosphatase: 487 U/L — ABNORMAL HIGH (ref 39–117)
BILIRUBIN TOTAL: 0.6 mg/dL (ref 0.3–1.2)
Bilirubin, Direct: 0.2 mg/dL (ref 0.0–0.3)
Indirect Bilirubin: 0.4 mg/dL (ref 0.3–0.9)
Total Protein: 5.9 g/dL — ABNORMAL LOW (ref 6.0–8.3)

## 2014-05-05 LAB — PHOSPHORUS: Phosphorus: 2.2 mg/dL — ABNORMAL LOW (ref 2.3–4.6)

## 2014-05-05 LAB — PROTIME-INR
INR: 1.34 (ref 0.00–1.49)
Prothrombin Time: 16.7 seconds — ABNORMAL HIGH (ref 11.6–15.2)

## 2014-05-05 LAB — MAGNESIUM: Magnesium: 1.8 mg/dL (ref 1.5–2.5)

## 2014-05-05 LAB — PROCALCITONIN: Procalcitonin: 3.74 ng/mL

## 2014-05-05 LAB — TROPONIN I: TROPONIN I: 0.11 ng/mL — AB (ref <0.031)

## 2014-05-05 MED ORDER — INSULIN GLARGINE 100 UNIT/ML ~~LOC~~ SOLN
10.0000 [IU] | SUBCUTANEOUS | Status: DC
  Administered 2014-05-05 – 2014-05-07 (×3): 10 [IU] via SUBCUTANEOUS
  Filled 2014-05-05 (×4): qty 0.1

## 2014-05-05 MED ORDER — DEXTROSE 10 % IV SOLN: INTRAVENOUS | Status: DC | PRN

## 2014-05-05 MED ORDER — MAGNESIUM SULFATE 2 GM/50ML IV SOLN
2.0000 g | Freq: Once | INTRAVENOUS | Status: AC
  Administered 2014-05-05: 2 g via INTRAVENOUS
  Filled 2014-05-05: qty 50

## 2014-05-05 MED ORDER — POTASSIUM PHOSPHATES 15 MMOLE/5ML IV SOLN
10.0000 mmol | Freq: Once | INTRAVENOUS | Status: AC
  Administered 2014-05-05: 10 mmol via INTRAVENOUS
  Filled 2014-05-05: qty 3.33

## 2014-05-05 MED ORDER — INSULIN ASPART 100 UNIT/ML ~~LOC~~ SOLN
1.0000 [IU] | SUBCUTANEOUS | Status: DC
  Administered 2014-05-05 (×2): 2 [IU] via SUBCUTANEOUS
  Administered 2014-05-06: 1 [IU] via SUBCUTANEOUS
  Administered 2014-05-06 (×2): 2 [IU] via SUBCUTANEOUS
  Administered 2014-05-06: 3 [IU] via SUBCUTANEOUS
  Administered 2014-05-07: 2 [IU] via SUBCUTANEOUS
  Administered 2014-05-07: 3 [IU] via SUBCUTANEOUS
  Administered 2014-05-07: 2 [IU] via SUBCUTANEOUS
  Administered 2014-05-07 (×2): 3 [IU] via SUBCUTANEOUS

## 2014-05-05 MED ORDER — LOPERAMIDE HCL 1 MG/5ML PO LIQD
2.0000 mg | ORAL | Status: DC | PRN
  Administered 2014-05-05 – 2014-05-10 (×6): 2 mg
  Filled 2014-05-05 (×11): qty 10

## 2014-05-05 MED ORDER — DEXTROSE 5 % IV SOLN
1.0000 g | INTRAVENOUS | Status: DC
  Administered 2014-05-05 – 2014-05-11 (×7): 1 g via INTRAVENOUS
  Filled 2014-05-05 (×8): qty 1

## 2014-05-05 NOTE — Progress Notes (Signed)
ANTIBIOTIC CONSULT NOTE - INITIAL  Pharmacy Consult for Vanco/Cefepime Indication: HCAP  Allergies  Allergen Reactions  . Morphine And Related     Patient Measurements: Height: 5\' 4"[id number]$  (162.6 cm) Weight: 247 lb 2.2 oz (112.1 kg) IBW/kg (Calculated) : 54.7 Adjusted Body Weight:    Vital Signs: Temp: 102.3 F (39.1 C) (01/08 0800) Temp Source: Axillary (01/08 0800) BP: 135/68 mmHg (01/08 0800) Pulse Rate: 86 (01/08 0841) Intake/Output from previous day: 01/07 0701 - 01/08 0700 In: 3184.6 [I.V.:1639.6; NG/GT:1495; IV Piggyback:50] Out: 1475 [Urine:1225; Stool:250] Intake/Output from this shift: Total I/O In: -  Out: 225 [Urine:225]  Labs:  Recent Labs  05/03/14 0401  05/04/14 0345 05/04/14 1410 05/04/14 1919 05/05/14 0500  WBC 13.5*  --  15.5*  --   --   --   HGB 11.9*  --  9.9*  --   --   --   PLT 174  --  151  --   --   --   CREATININE 2.16*  < > 2.23* 2.10* 2.43* 2.40*  < > = values in this interval not displayed. Estimated Creatinine Clearance: 27.1 mL/min (by C-G formula based on Cr of 2.4). No results for input(s): VANCOTROUGH, VANCOPEAK, VANCORANDOM, GENTTROUGH, GENTPEAK, GENTRANDOM, TOBRATROUGH, TOBRAPEAK, TOBRARND, AMIKACINPEAK, AMIKACINTROU, AMIKACIN in the last 72 hours.   Microbiology:   Medical History: Past Medical History  Diagnosis Date  . Thyroid disease   . Rhabdomyolysis 04/18/2014  . Hyperosmolar non-ketotic state in patient with type 2 diabetes mellitus 04/17/2014  . AKI (acute kidney injury) 04/18/2014  . Sepsis 04/18/2014  . Atrial fibrillation 04/27/2014  . Diabetes mellitus, type 2 04/27/2014    Assessment: ID: CTX >> Vanc/Zosyn for r/o sepsis/UTI- WBC 15.5 (up), Continued fevers up to 102.3. Procalcitonin up to 1.24>>3.74 this am. CXR showed bilateral pleural effusions with lower lobe collapse/consolidation CTX  Vanc 1/6 >> Zosyn 1/6 >>1/8 Cefepime 1/8>>  1/7 Cdiff: negative 1/6 Blood >> pending 1/3 Urine >> Ecoli (R-Amp,  Cipro, Levo, Bactrim; S-Ancef/CTX, gent, ntf, zosyn, tobra) 1/1 Blood >> negative  Goal of Therapy:  Vanco trough 15-20  Plan:  Change Zosyn to Cefepime 1g IV q24h. Vancomycin 1500mg  IV q48h dose ok   Janita Camberos S. Merilynn Finlandobertson, PharmD, BCPS Clinical Staff Pharmacist Pager [phone]  Misty Stanleyobertson, Jalen Oberry Stillinger 05/05/2014,10:28 AM

## 2014-05-05 NOTE — Progress Notes (Signed)
UR completed.  Await medical stability to determine pt's needs for d/c, LTACH vs SNF vs HH.  Carlyle LipaMichelle Niylah Hassan, RN BSN MHA CCM Trauma/Neuro ICU Case Manager [phone]

## 2014-05-05 NOTE — Consult Note (Signed)
PULMONARY / CRITICAL CARE MEDICINE   Name: [name] MRN: [id number] DOB: 11/18/1945    ADMISSION DATE:  04/27/2014 CONSULTATION DATE:  05/04/2014   REFERRING MD :  TRiad hospitalist  CHIEF COMPLAINT:  Acute encephalopathy, Sepsis, Circyulatory shock, Stroke, hypernatremia  HPI: Hx from Dr Roda Shutters of neuro and chart review  [name] major owner of [employer]. Admitted 04/17/14 through 04/25/14 3 weeks ago to [facility] due to SEpsis due to CAP NOS RLL on CT, HONK, A Fib RVR, rhabdo (CPK 1700s) , AKI (creat 2.7), Lactic acidosis, demand NSTEMI . AT discharge had mild hypernatermia to 154 , and then dc to snf where Acute encphalopathy present. Back at 04/27/2014 - Na 162 and strroke L MCA with UTI. Na slowly corrected and then Na up due to NPO for TEE. Also new fever since 05/03/14 and Na now 166, BP 73/d, and worsening enceph and obutnddation. PCCM taking over primary 04/27/2014   has a past medical history of Thyroid disease; Rhabdomyolysis (04/18/2014); Hyperosmolar non-ketotic state in patient with type 2 diabetes mellitus (04/17/2014); AKI (acute kidney injury) (04/18/2014); Sepsis (04/18/2014); Atrial fibrillation (04/27/2014); and Diabetes mellitus, type 2 (04/27/2014).    SIGNIFICANT EVENTS: 04/27/2014 - admit 05/03/14 - ICU transfer 05/04/14 - PCCM primary. Intubation and CVL    SUBJECTIVE/OVERNIGHT/INTERVAL HX 05/05/14: needing diprivan to avoid tachypnea, Febrile + , on levophed, Na/Creat improved  VITAL SIGNS: Temp:  [99.7 F (37.6 C)-103 F (39.4 C)] 102.3 F (39.1 C) (01/08 0800) Pulse Rate:  [68-100] 86 (01/08 0841) Resp:  [16-35] 21 (01/08 0841) BP: (60-161)/(28-109) 135/68 mmHg (01/08 0800) SpO2:  [96 %-100 %] 99 % (01/08 0841) FiO2 (%):  [40 %-100 %] 40 % (01/08 0900) Weight:  [112.1 kg (247 lb 2.2 oz)] 112.1 kg (247 lb 2.2 oz) (01/08 0500) HEMODYNAMICS:   VENTILATOR SETTINGS: Vent Mode:  [-] PRVC FiO2 (%):  [40 %-100 %] 40 % Set Rate:  [16 bmp] 16 bmp Vt Set:   [440 mL] 440 mL PEEP:  [5 cmH20] 5 cmH20 Pressure Support:  [10 cmH20] 10 cmH20 Plateau Pressure:  [16 cmH20-20 cmH20] 16 cmH20 INTAKE / OUTPUT:  Intake/Output Summary (Last 24 hours) at 05/05/14 1004 Last data filed at 05/05/14 0700  Gross per 24 hour  Intake 2989.56 ml  Output   1375 ml  Net 1614.56 ml    PHYSICAL EXAMINATION: General:  Obese, critically ill looking Neuro:  RASS -4 equivalent, on vent on diprivan HEENT:  Mouth open Cardiovascular:  Normal heart sounds Lungs:  Sync with vent + Abdomen:  Obesest, Soft Musculoskeletal:  Scattered skin bruises, RT Sided PICC + and Left IJ + GU - dirty urine in fole Skin:  Intact anteriorly  LABS: PULMONARY  Recent Labs Lab 05/03/14 0510 05/04/14 1628  PHART 7.441 7.405  PCO2ART 43.4 39.6  PO2ART 58.0* 294.0*  HCO3 29.1* 24.8*  TCO2 30.4 26  O2SAT 88.6 100.0    CBC  Recent Labs Lab 05/02/14 0410 05/03/14 0401 05/04/14 0345  HGB 11.1* 11.9* 9.9*  HCT 36.5 38.8 33.2*  WBC 13.8* 13.5* 15.5*  PLT 151 174 151    COAGULATION  Recent Labs Lab 05/05/14 0500  INR 1.34    CARDIAC    Recent Labs Lab 05/04/14 1410 05/04/14 1919 05/05/14 0500  TROPONINI 0.10* 0.10* 0.11*   No results for input(s): PROBNP in the last 168 hours.   CHEMISTRY  Recent Labs Lab 04/29/14 0350  05/03/14 1700 05/04/14 0345 05/04/14 1410 05/04/14 1919 05/05/14 0500  NA 167*  < > 166* 166* 159* 156* 155*  K 3.1*  < > 3.3* 3.2* 4.4 3.9 3.5  CL >130*  < > >130* 129* 128* 123* 124*  CO2 26  < > 28 32 24 25 27   GLUCOSE 145*  < > 180* 190* 270* 348* 126*  BUN 17  < > 37* 37* 35* 38* 43*  CREATININE 1.96*  < > 2.30* 2.23* 2.10* 2.43* 2.40*  CALCIUM 8.1*  < > 8.2* 8.1* 7.5* 7.7* 7.8*  MG 1.8  --   --   --   --   --  1.8  PHOS  --   --   --   --   --   --  2.2*  < > = values in this interval not displayed. Estimated Creatinine Clearance: 27.1 mL/min (by C-G formula based on Cr of 2.4).   LIVER  Recent Labs Lab  05/04/14 0345 05/05/14 0500  AST 128* 46*  ALT 38* 25  ALKPHOS 635* 487*  BILITOT 0.6 0.6  PROT 5.7* 5.9*  ALBUMIN 1.8* 1.6*  INR  --  1.34     INFECTIOUS  Recent Labs Lab 05/04/14 1410 05/05/14 0500  LATICACIDVEN 2.3*  --   PROCALCITON 1.24 3.74     ENDOCRINE CBG (last 3)   Recent Labs  05/05/14 0601 05/05/14 0657 05/05/14 0801  GLUCAP 117* 133* 148*         IMAGING x48h Dg Chest Port 1 View  05/04/2014   CLINICAL DATA:  69 year old female intubated for acute respiratory failure. Initial encounter.  EXAM: PORTABLE CHEST - 1 VIEW  COMPARISON:  0844 hr today and earlier.  FINDINGS: Portable AP semi upright view at 1259 hrs. The patient is now rotated to the left. Endotracheal tube projects over the trachea at the level of the clavicles. Enteric tube remains in place. New left IJ approach central venous catheter, tip projects adjacent to the preexisting right PICC line at the level of the lower SVC.  Continued dense retrocardiac opacity, but less veiling opacity at the right lung base now. No pneumothorax or pulmonary edema identified. Grossly stable mediastinal contours.  IMPRESSION: 1. Endotracheal tube and left IJ central line appear appropriately placed. No pneumothorax. 2. Continued dense retrocardiac opacity suggesting lower lobe collapse or consolidation, but right lung base ventilation may have improved.   Electronically Signed   By: Augusto GambleLee  Hall M.D.   On: 05/04/2014 13:36   Dg Chest Port 1 View  05/04/2014   CLINICAL DATA:  69 year old female with fever. Altered mental status, recent sepsis and rhabdomyolysis. Initial encounter.  EXAM: PORTABLE CHEST - 1 VIEW  COMPARISON:  05/03/2014 and earlier.  FINDINGS: Portable AP semi upright view at at 0844 hrs. Stable right PICC line. Enteric feeding tube courses to the left abdomen, tip not included. The patient is less rotated today. Stable cardiac size and mediastinal contours. Confluent bibasilar opacity with obscuration of  the diaphragm suggestive of bilateral effusions and lower lobe collapse/consolidation. No pneumothorax. No definite pulmonary edema.  IMPRESSION: 1.  Stable lines and tubes. 2. Bilateral pleural effusions with lower lobe collapse/consolidation.   Electronically Signed   By: Augusto GambleLee  Hall M.D.   On: 05/04/2014 09:36        ASSESSMENT / PLAN:  PULMONARY OETT* 05/04/2014  A: acute resp failure to encephalopathy and congested cxr with LLL ? HCAP  P:   PRVC VAP protocol  CARDIOVASCULAR CVLRt UE picc A: Circulatory shock - ? Due to dehydration fron  high Na v sepsis v both P:  Free water with levophed MAP goal > 65  RENAL  A:  Hypernatremia severe and Acute Renal Failure   - improved 05/05/2014 but mag and Phos low  P:   Free water with levophed Replete mag and phos Monitor bmet   GASTROINTESTINAL A:  Mental status precludes diet P:   NPO Tube feeds  HEMATOLOGIC   A:  Anemia of critical illness P:  PRBC for hgb < 7gm% Lovenox for dvt proph  INFECTIOUS 1./1 - blodo culture - negative 04/30/14 - urine culture - e colii - sensit to ceftriaxone 05/03/14 - blood negative 05/03/14 - MRSA PCR - negative 05/04/14- C Diff  - negative 1/8./16 trach aspirate  A:  UTI septic shock +/- HCAP   - ongoing fevers + 05/05/14  P:   vanc Zosyn - change to cefepime 05/05/2014 Tylenol for fever    ENDOCRINE A:  DM  P:   ICU hyperglycemia protocol - start phase 3 05/05/2014   NEUROLOGIC A:  COmatose due to hypernatremia. REcent stroke  - sedated with diprivan gtt + prn fent  P:   Fent prn for sedation + diprivan gtt but aim to manage without diprivan RASS goal 0 (currently -4) Neuro helping  FAMILY  - Updates:  Bedside - brother and sister in law updated 05/05/2014. Husband at work  - Inter-disciplinary family meet or Palliative Care meeting due by:  05/11/14        The patient is critically ill with multiple organ systems failure and requires high complexity decision making  for assessment and support, frequent evaluation and titration of therapies, application of advanced monitoring technologies and extensive interpretation of multiple databases.   Critical Care Time devoted to patient care services described in this note is  30  Minutes. This time reflects time of care of this signee Dr Kalman Shan. This critical care time does not reflect procedure time, or teaching time or supervisory time of PA/NP/Med student/Med Resident etc but could involve care discussion time    Dr. Kalman Shan, M.D., [facility].C.P Pulmonary and Critical Care Medicine Staff Physician [facility] Pager: [phone], If no answer or between  15:00h - 7:00h: call [phone]  05/05/2014 10:04 AM

## 2014-05-05 NOTE — Progress Notes (Signed)
STROKE TEAM PROGRESS NOTE   HISTORY [name] is a 69 y.o. female with a history of atrial fibrillation and recent hospitalization for sepsis, pneumonia rhabdomyolysis as well as hypernatremia and acute kidney injury, [facility] for increased confusion and speech abnormality. Serum sodium was 169. CT scan of her head showed low density areas involving large area of left MCA territory as well as right temporal abnormality, with appearance indicative of probable subacute infarctions. MRI was recommended but could not be obtained because of patient's agitated state. She was transferred here for further evaluation and management with stroke service intervention.  LSN: Unclear tPA Given: No: CT abnormalities is an on presentation, as well as unclear when last known well. mRankin:  SUBJECTIVE (INTERVAL HISTORY) No family is at bedside. Patient is remained intubated and on propofol. Na 155 this am. Still has diarrhea but C. Diff negative. Still on levophed for BP which is stable overnight. Still has fever, blood culture NGTD.  OBJECTIVE Temp:  [99.7 F (37.6 C)-103 F (39.4 C)] 99.7 F (37.6 C) (01/08 0600) Pulse Rate:  [68-100] 76 (01/08 0700) Cardiac Rhythm:  [-] Atrial fibrillation (01/07 2000) Resp:  [16-35] 24 (01/08 0700) BP: (60-161)/(28-109) 125/70 mmHg (01/08 0700) SpO2:  [96 %-100 %] 100 % (01/08 0700) FiO2 (%):  [45 %-100 %] 45 % (01/08 0347) Weight:  [247 lb 2.2 oz (112.1 kg)] 247 lb 2.2 oz (112.1 kg) (01/08 0500)   Recent Labs Lab 05/05/14 0401 05/05/14 0521 05/05/14 0601 05/05/14 0657 05/05/14 0801  GLUCAP 110* 119* 117* 133* 148*    Recent Labs Lab 04/29/14 0350  05/03/14 1700 05/04/14 0345 05/04/14 1410 05/04/14 1919 05/05/14 0500  NA 167*  < > 166* 166* 159* 156* 155*  K 3.1*  < > 3.3* 3.2* 4.4 3.9 3.5  CL >130*  < > >130* 129* 128* 123* 124*  CO2 26  < > 28 32 24 25 27   GLUCOSE 145*  < > 180* 190* 270* 348* 126*   BUN 17  < > 37* 37* 35* 38* 43*  CREATININE 1.96*  < > 2.30* 2.23* 2.10* 2.43* 2.40*  CALCIUM 8.1*  < > 8.2* 8.1* 7.5* 7.7* 7.8*  MG 1.8  --   --   --   --   --  1.8  PHOS  --   --   --   --   --   --  2.2*  < > = values in this interval not displayed.  Recent Labs Lab 05/04/14 0345 05/05/14 0500  AST 128* 46*  ALT 38* 25  ALKPHOS 635* 487*  BILITOT 0.6 0.6  PROT 5.7* 5.9*  ALBUMIN 1.8* 1.6*    Recent Labs Lab 04/30/14 0429 05/01/14 0635 05/02/14 0410 05/03/14 0401 05/04/14 0345  WBC 16.8* 13.6* 13.8* 13.5* 15.5*  NEUTROABS 13.3*  --   --   --   --   HGB 11.6* 10.7* 11.1* 11.9* 9.9*  HCT 36.8 34.7* 36.5 38.8 33.2*  MCV 96.6 95.9 95.8 96.8 97.1  PLT 152 122* 151 174 151    Recent Labs Lab 05/04/14 1410 05/04/14 1919 05/05/14 0500  TROPONINI 0.10* 0.10* 0.11*    Recent Labs  05/05/14 0500  LABPROT 16.7*  INR 1.34    Recent Labs  05/04/14 1500  COLORURINE YELLOW  LABSPEC 1.018  PHURINE 5.0  GLUCOSEU NEGATIVE  HGBUR SMALL*  BILIRUBINUR NEGATIVE  KETONESUR NEGATIVE  PROTEINUR 30*  UROBILINOGEN 0.2  NITRITE NEGATIVE  LEUKOCYTESUR SMALL*       Component Value Date/Time   CHOL 101 04/29/2014 0350   TRIG 182* 04/29/2014 0350   HDL 26* 04/29/2014 0350   CHOLHDL 3.9 04/29/2014 0350   VLDL 36 04/29/2014 0350   LDLCALC 39 04/29/2014 0350   Lab Results  Component Value Date   HGBA1C 12.1* 04/29/2014      Component Value Date/Time   LABOPIA POSITIVE* 04/18/2014 1754   COCAINSCRNUR NONE DETECTED 04/18/2014 1754   LABBENZ NONE DETECTED 04/18/2014 1754   AMPHETMU NONE DETECTED 04/18/2014 1754   THCU NONE DETECTED 04/18/2014 1754   LABBARB NONE DETECTED 04/18/2014 1754    No results for input(s): ETH in the last 168 hours.  I have personally reviewed the radiological images below and agree with the radiology interpretations.  Ct Head Wo Contrast 04/27/2014    Moderate region of decreased density in the left parietal-occipital lobe concerning  for subacute infarct. There is additional questionable region of decreased density in the right temporal lobe. Given the questioned multifocal involvement, MRI of the brain, preferably with contrast, recommended for further evaluation to exclude the possibility of underlying mass lesions.   05/02/14  Atrophy with again identified subacute infarct involving the LEFT temporoparietal region extending into the occipital lobe. No new intracranial abnormalities.  MRI not able to be done due to agitation  Dg Chest [facility] 04/27/2014    No acute cardiopulmonary abnormality seen.  DG Chest 1 View 04/28/2014  Mild congestive heart failure 05/02/2014 No active cardiopulmonary disease. 05/03/2014 Shallow inspiration with linear atelectasis in the right lung base. Appliances are unchanged in position. 05/04/2014 1. Stable lines and tubes. 2. Bilateral pleural effusions with lower lobe collapse/consolidation.  2D echo 04/18/14 - Left ventricle: The cavity size was normal. Wall thickness was normal. Systolic function was normal. The estimated ejection fraction was in the range of 55% to 60%. - Left atrium: The atrium was mildly dilated. Impressions: - Overall very poor image quality.  CUS -  Bilateral: 1-39% ICA stenosis. Vertebral artery flow is antegrade.   Venous doppler - There is no obvious evidence of DVT or SVT noted in the bilateral lower extremities.   EEG -  1) Asymmetric PDR. 2) irregular delta activity.  Clinical Interpretation: This EEG is most consistent with a generalized cerebral dysfunction with a superimposed area of cortical dysfunction as evidenced by attenuated voltages in the left posterior quadrant. There was no seizure or seizure predisposition recorded on this study.    PHYSICAL EXAM  Temp:  [99.7 F (37.6 C)-103 F (39.4 C)] 99.7 F (37.6 C) (01/08 0600) Pulse Rate:  [68-100] 76 (01/08 0700) Resp:  [16-35] 24 (01/08 0700) BP: (60-161)/(28-109) 125/70  mmHg (01/08 0700) SpO2:  [96 %-100 %] 100 % (01/08 0700) FiO2 (%):  [45 %-100 %] 45 % (01/08 0347) Weight:  [247 lb 2.2 oz (112.1 kg)] 247 lb 2.2 oz (112.1 kg) (01/08 0500)  General - morbid obesity, well developed, intubated, eyes not open on stimulation.  Ophthalmologic - not able to test due to agitation.  Cardiovascular - Irregular rate and rhythm.  Neuro - intubated on vent with propofol, grimace on pain stimulation, not following commands. Possible right facial droop. PERRL, resistant to forced eye opening, move both upper extremities spontaneously and equally, mild withdraw b/l LEs on pain stimulation. Reflex 1+ and no babinski.   ASSESSMENT/PLAN [name] is a 69 y.o. female with history of atrial fibrillation, diabetes mellitus, recent sepsis, presenting with confusion  and speech abnormalities . She did not receive IV t-PA due to unknown time of onset.  Stroke: left MCA embolic infarcts - etiology not clear but likely due to atrial fibrillation or endocarditis due to sepsis.   Resultant  Confusion/global aphasia  MRI (not yet performed secondary to agitation) - consider MRI once medically more stabilized.   Repeat CAT scan showed stable left MCA stroke.  Carotid Doppler - unremarkable  2D Echo EF 55-60%. Poor images. No cardiac source of emboli identified.  Hold off TEE as patient is not stable. Will consider once patient stabilized.  LE doppler - negative for DVT  LDL 39  HgbA1c 12.1, not at goal  Lovenox for VTE prophylaxis   Diet NPO time specified no liquids, on tube feeding  aspirin 81 mg orally every day prior to admission, now on aspirin 300 mg suppository daily or ASA  via PANDA  Ongoing aggressive stroke risk factor management  Therapy recommendations:  Pending  Disposition:  Pending  Respiratory failure  Increased WOB  CXR showed bilateral pleural effusions with lower lobe collapse/consolidation  Intubated and transfer to ICU  On  vanco and zosyn  CCM on board  Will do sputum culture   Septic shock  Febrile still  Low BP and given bolus and on levophed now  CXR showed lower lung consolidation  On vanco and zosyn  Low suspicious for meningitis or encephalitis, but may consider broader antibiotics coverage. CCM would consider.  Blood culture NGTD  Leukocytosis - WBC's 21.1->16.8->13.6->15.5  UA repeat showed much improved UTI  Will do sputum culture  hypernatremia - due to dehydration.  - Na 166->155.  - continue free water Q3h  - Na Q8 - CCM on board - EEG no seizure  UTI - on vanco and zosyn - Culture showed E Coli - repeat UA showed much improved UTI  ? Hx of Afib - family stated that they have not being told about hx of Afib - continue tele - multiple runs of probable MAT in the 100-160 range  - if comfirmed, she need life long anticoagulation - may consider cardiology consult once medically stabilized  Diabetes  HgbA1c 12.1 goal < 7.0  Uncontrolled  On insulin drip  SSI  Close monitoring  Nutrition  S/p PANDA   On tube feeding with trickle feeds  On free water  Dietitian following   Other Stroke Risk Factors  Advanced age  Obesity, Body mass index is 42.4 kg/(m^2).   Other Active Problems  Hypokalemia - supplement, 3.5 today  Elevated creatinine 2.23->2.43->2.40   Other Pertinent History  No regular medical follow-up for 17 years.   Hospital day # 8  This patient is critically ill due to respiratory failure, septic shock, hypernatremia, high grade fever and at significant risk of neurological worsening, death from brain edema, respiratory failure, sepsis. This patient's care requires constant monitoring of vital signs, hemodynamics, respiratory and cardiac monitoring, review of multiple databases, neurological assessment, other specialists and medical decision making of high complexity. I have updated husband at the bedside. spent 45 minutes of  neurocritical care time in the care of this patient.  Marvel Plan, MD PhD Stroke Neurology 05/05/2014 8:36 AM    To contact Stroke Continuity provider, please refer to [email or web address]. After hours, contact General Neurology

## 2014-05-06 ENCOUNTER — Inpatient Hospital Stay (HOSPITAL_COMMUNITY): Payer: Medicare Other

## 2014-05-06 DIAGNOSIS — R410 Disorientation, unspecified: Secondary | ICD-10-CM

## 2014-05-06 LAB — GLUCOSE, CAPILLARY
GLUCOSE-CAPILLARY: 113 mg/dL — AB (ref 70–99)
GLUCOSE-CAPILLARY: 201 mg/dL — AB (ref 70–99)
Glucose-Capillary: 119 mg/dL — ABNORMAL HIGH (ref 70–99)
Glucose-Capillary: 124 mg/dL — ABNORMAL HIGH (ref 70–99)
Glucose-Capillary: 164 mg/dL — ABNORMAL HIGH (ref 70–99)
Glucose-Capillary: 170 mg/dL — ABNORMAL HIGH (ref 70–99)

## 2014-05-06 LAB — BASIC METABOLIC PANEL
Anion gap: 6 (ref 5–15)
BUN: 50 mg/dL — AB (ref 6–23)
CO2: 24 mmol/L (ref 19–32)
Calcium: 8 mg/dL — ABNORMAL LOW (ref 8.4–10.5)
Chloride: 120 mEq/L — ABNORMAL HIGH (ref 96–112)
Creatinine, Ser: 2.37 mg/dL — ABNORMAL HIGH (ref 0.50–1.10)
GFR calc non Af Amer: 20 mL/min — ABNORMAL LOW (ref >90)
GFR, EST AFRICAN AMERICAN: 23 mL/min — AB (ref >90)
Glucose, Bld: 120 mg/dL — ABNORMAL HIGH (ref 70–99)
Potassium: 3.1 mmol/L — ABNORMAL LOW (ref 3.5–5.1)
Sodium: 150 mmol/L — ABNORMAL HIGH (ref 135–145)

## 2014-05-06 LAB — CBC WITH DIFFERENTIAL/PLATELET
BASOS PCT: 0 % (ref 0–1)
Basophils Absolute: 0 10*3/uL (ref 0.0–0.1)
Eosinophils Absolute: 0.6 10*3/uL (ref 0.0–0.7)
Eosinophils Relative: 4 % (ref 0–5)
HCT: 28.5 % — ABNORMAL LOW (ref 36.0–46.0)
Hemoglobin: 8.7 g/dL — ABNORMAL LOW (ref 12.0–15.0)
Lymphocytes Relative: 13 % (ref 12–46)
Lymphs Abs: 2.2 10*3/uL (ref 0.7–4.0)
MCH: 28.5 pg (ref 26.0–34.0)
MCHC: 30.5 g/dL (ref 30.0–36.0)
MCV: 93.4 fL (ref 78.0–100.0)
MONOS PCT: 3 % (ref 3–12)
Monocytes Absolute: 0.6 10*3/uL (ref 0.1–1.0)
NEUTROS PCT: 80 % — AB (ref 43–77)
Neutro Abs: 13.4 10*3/uL — ABNORMAL HIGH (ref 1.7–7.7)
PLATELETS: 185 10*3/uL (ref 150–400)
RBC: 3.05 MIL/uL — ABNORMAL LOW (ref 3.87–5.11)
RDW: 15.8 % — ABNORMAL HIGH (ref 11.5–15.5)
WBC: 16.8 10*3/uL — AB (ref 4.0–10.5)

## 2014-05-06 LAB — PHOSPHORUS: PHOSPHORUS: 3.5 mg/dL (ref 2.3–4.6)

## 2014-05-06 LAB — MAGNESIUM: Magnesium: 2 mg/dL (ref 1.5–2.5)

## 2014-05-06 LAB — PROCALCITONIN: Procalcitonin: 3.31 ng/mL

## 2014-05-06 MED ORDER — POTASSIUM CHLORIDE 20 MEQ/15ML (10%) PO SOLN
40.0000 meq | Freq: Once | ORAL | Status: AC
  Administered 2014-05-06: 40 meq
  Filled 2014-05-06: qty 30

## 2014-05-06 MED ORDER — PANTOPRAZOLE SODIUM 40 MG PO PACK
40.0000 mg | PACK | ORAL | Status: DC
  Administered 2014-05-06 – 2014-05-16 (×10): 40 mg
  Filled 2014-05-06 (×13): qty 20

## 2014-05-06 MED ORDER — HYDROCORTISONE NA SUCCINATE PF 100 MG IJ SOLR
50.0000 mg | Freq: Four times a day (QID) | INTRAMUSCULAR | Status: DC
  Administered 2014-05-06 – 2014-05-10 (×16): 50 mg via INTRAVENOUS
  Filled 2014-05-06 (×20): qty 1

## 2014-05-06 NOTE — Progress Notes (Signed)
PULMONARY / CRITICAL CARE MEDICINE   Name: [name] MRN: [id number] DOB: 09/30/1945    ADMISSION DATE:  04/27/2014 CONSULTATION DATE:  05/04/2014   REFERRING MD :  TRiad hospitalist  CHIEF COMPLAINT:  Acute encephalopathy, Sepsis, Circyulatory shock, Stroke, hypernatremia  HPI: Hx from Dr Roda ShuttersXu of neuro [id number] yof admitted initially 04/17/14 through 04/25/14  to [facility] due to Sepsis in setting of CAP (NOS) RLL on CT, HONK, A Fib RVR, rhabdo (CPK 1700s) , AKI (creat 2.7), Lactic acidosis, demand NSTEMI . AT discharge time of dc to SNF had mild hypernatermia to 154. Re-admitted on 04/27/2014 - Na 162 and stroke L MCA with UTI. Na slowly corrected and then Na up due to NPO for TEE. Developed new fever since 05/03/14 and Na was again up to 166, BP 73/d, and worsening enceph and obutndation. PCCM taking over primary 04/27/2014  SIGNIFICANT EVENTS: 04/27/2014 - admit 05/03/14 - ICU transfer 05/04/14 - PCCM primary. Intubation   VITAL SIGNS: Temp:  [100.1 F (37.8 C)-101.6 F (38.7 C)] 100.8 F (38.2 C) (01/09 0800) Pulse Rate:  [53-90] 72 (01/09 1200) Resp:  [9-34] 19 (01/09 1200) BP: (83-152)/(40-92) 111/40 mmHg (01/09 1200) SpO2:  [97 %-100 %] 100 % (01/09 1200) FiO2 (%):  [45 %] 45 % (01/09 0800) Weight:  [113.3 kg (249 lb 12.5 oz)] 113.3 kg (249 lb 12.5 oz) (01/09 0530) HEMODYNAMICS:   VENTILATOR SETTINGS: Vent Mode:  [-] PRVC FiO2 (%):  [45 %] 45 % Set Rate:  [16 bmp] 16 bmp Vt Set:  [440 mL] 440 mL PEEP:  [5 cmH20] 5 cmH20 Plateau Pressure:  [12 cmH20-18 cmH20] 18 cmH20 INTAKE / OUTPUT:  Intake/Output Summary (Last 24 hours) at 05/06/14 1225 Last data filed at 05/06/14 1200  Gross per 24 hour  Intake 2354.15 ml  Output   4560 ml  Net -2205.85 ml    PHYSICAL EXAMINATION: General:  Obese, critically ill looking Neuro:  Awake, does not f/c  HEENT:  Mouth open Cardiovascular:  Tachycardic, Hypotensive,  Lungs:  Tachypneic, paradoxical, Coarse Abdomen:  Obesest,  Soft Musculoskeletal:  Scattered skin bruises, RT Sided PICC + GU - dirty urine in fole Skin:  Intact anteriorly  LABS: PULMONARY  Recent Labs Lab 05/03/14 0510 05/04/14 1628  PHART 7.441 7.405  PCO2ART 43.4 39.6  PO2ART 58.0* 294.0*  HCO3 29.1* 24.8*  TCO2 30.4 26  O2SAT 88.6 100.0   CBC  Recent Labs Lab 05/03/14 0401 05/04/14 0345 05/06/14 0550  HGB 11.9* 9.9* 8.7*  HCT 38.8 33.2* 28.5*  WBC 13.5* 15.5* 16.8*  PLT 174 151 185   COAGULATION  Recent Labs Lab 05/05/14 0500  INR 1.34    CARDIAC  Recent Labs Lab 05/04/14 1410 05/04/14 1919 05/05/14 0500  TROPONINI 0.10* 0.10* 0.11*   No results for input(s): PROBNP in the last 168 hours.   CHEMISTRY  Recent Labs Lab 05/04/14 0345 05/04/14 1410 05/04/14 1919 05/05/14 0500 05/06/14 0550  NA 166* 159* 156* 155* 150*  K 3.2* 4.4 3.9 3.5 3.1*  CL 129* 128* 123* 124* 120*  CO2 32 24 25 27 24   GLUCOSE 190* 270* 348* 126* 120*  BUN 37* 35* 38* 43* 50*  CREATININE 2.23* 2.10* 2.43* 2.40* 2.37*  CALCIUM 8.1* 7.5* 7.7* 7.8* 8.0*  MG  --   --   --  1.8 2.0  PHOS  --   --   --  2.2* 3.5   Estimated Creatinine Clearance: 27.6 mL/min (by C-G formula based on Cr of 2.37).  LIVER  Recent Labs Lab 05/04/14 0345 05/05/14 0500  AST 128* 46*  ALT 38* 25  ALKPHOS 635* 487*  BILITOT 0.6 0.6  PROT 5.7* 5.9*  ALBUMIN 1.8* 1.6*  INR  --  1.34   INFECTIOUS  Recent Labs Lab 05/04/14 1410 05/05/14 0500 05/06/14 0550  LATICACIDVEN 2.3*  --   --   PROCALCITON 1.24 3.74 3.31   ENDOCRINE CBG (last 3)   Recent Labs  05/06/14 0327 05/06/14 0813 05/06/14 1125  GLUCAP 124* 119* 113*   IMAGING x48h Dg Chest Port 1 View  05/06/2014   CLINICAL DATA:  Follow up ventilated patient.  Subsequent encounter.  EXAM: PORTABLE CHEST - 1 VIEW  COMPARISON:  Chest radiograph performed 05/05/2014  FINDINGS: The patient's endotracheal tube is seen ending 4 cm above the carina. A right PICC is noted ending about  the proximal to mid SVC. A left IJ line is seen ending about the mid SVC. Evaluation is suboptimal due to patient rotation.  Left basilar airspace opacification is again noted, raising concern for pneumonia. A small left pleural effusion is suspected. No pneumothorax is seen. The right lung is not well assessed due to patient rotation.  The cardiomediastinal silhouette is borderline normal in size. No acute osseous abnormalities are seen.  IMPRESSION: Persistent left basilar airspace opacification raises concern for pneumonia. Suspect small left pleural effusion.   Electronically Signed   By: Roanna Raider M.D.   On: 05/06/2014 07:36   Dg Chest Port 1 View  05/05/2014   CLINICAL DATA:  Followup endotracheal tube  EXAM: PORTABLE CHEST - 1 VIEW  COMPARISON:  05/04/2014  FINDINGS: Cardiomediastinal silhouette is stable. Endotracheal tube with tip 4.3 cm above the carina. Stable right arm PICC line position. No convincing pulmonary edema. Persistent left base retrocardiac atelectasis or infiltrate. No pneumothorax. NG tube is unchanged in position.  IMPRESSION: Stable support apparatus. No convincing pulmonary edema. No pneumothorax. Persistent left base retrocardiac atelectasis or infiltrate.   Electronically Signed   By: Natasha Mead M.D.   On: 05/05/2014 10:41   Dg Chest Port 1 View  05/04/2014   CLINICAL DATA:  69 year old female intubated for acute respiratory failure. Initial encounter.  EXAM: PORTABLE CHEST - 1 VIEW  COMPARISON:  0844 hr today and earlier.  FINDINGS: Portable AP semi upright view at 1259 hrs. The patient is now rotated to the left. Endotracheal tube projects over the trachea at the level of the clavicles. Enteric tube remains in place. New left IJ approach central venous catheter, tip projects adjacent to the preexisting right PICC line at the level of the lower SVC.  Continued dense retrocardiac opacity, but less veiling opacity at the right lung base now. No pneumothorax or pulmonary edema  identified. Grossly stable mediastinal contours.  IMPRESSION: 1. Endotracheal tube and left IJ central line appear appropriately placed. No pneumothorax. 2. Continued dense retrocardiac opacity suggesting lower lobe collapse or consolidation, but right lung base ventilation may have improved.   Electronically Signed   By: Augusto Gamble M.D.   On: 05/04/2014 13:36    ASSESSMENT / PLAN:  PULMONARY OETT* 05/04/2014  A:  Acute resp failure to encephalopathy and possible HCAP vs aspiration  Stable, tolerating PSV, but MS not ready for extubation P:   Full vent support VAP protocol See ID  F/u pcxr and abg in am   CARDIOVASCULAR CVLRt UE picc A:  Circulatory shock - ? Due to dehydration fron high Na v sepsis v both > weaning pressors  P:  Get CVP  Cont levo for MAP goal > 65 CVP goal 8/12 Ck cortisol; add stress dose steroids for now   RENAL  A:   Hypernatremia -->slightly improved  Acute Renal Failure-->slightly improved  P:   Cont current free water replacement MAP goal > 65 F/u chem in am    GASTROINTESTINAL A:   Mental status precludes diet P:   NPO Tube feeds per nutrition   HEMATOLOGIC A:   Anemia of critical illness P:  PRBC for hgb < 7gm% Lovenox for dvt proph  INFECTIOUS 12.31.16 - MRSA PCR - neg 1./1 - blodo culture - negative 04/30/14 - urine culture - e colii - sensit to ceftriaxone 05/03/14 - blood negative 05/03/14 - MRSA PCR - negative A:   UTI septic shock +/- HCAP P:   vanc 1/9>>> Cefepime 1/9>>> F/u culture data   ENDOCRINE A:   DM  P:   ICU hyperglycemia protocol  NEUROLOGIC A:   Comatose due to hypernatremia. Subacute CVA  P:   Fent prn for sedation RASS goal 0  Neuro helping MRI per neuro; will get this after off pressors   Need TEE has been ordered. Cards following. Likely Monday   FAMILY  - Updates:  Bedside - friend updated. Acts as surrogate for family  - Inter-disciplinary family meet or Palliative Care meeting due by:   05/11/14   NP SUMMARY Still in shock but weaning pressors. Her hypernatremia and AKI are slowly improving.  Suspect aspiration in setting of metabolic encephalopathy superimposed on subacute CVA. She is tolerating PSV, but her MS precluded extubation at this point. We will assess CVp to assure adequate volume status, minimize sedation, cont abx as we await culture data. She has TEE ordered and pending. Will eventually need MRI but won't send her while on pressors. At this point hold current course of care.   Simonne Martinet ACNP-BC [facility] Pulmonary/Critical Care Pager # [phone] OR # [phone] if no answer 05/06/2014 12:25 PM  Attending Note:  I have examined patient, reviewed labs, studies and notes. I have discussed the case with Kreg Shropshire, and I agree with the data and plans as amended above. Pt is sedated and intubated in setting recent CVA. On exam she will grimace to stim and pain. She is tolerating PSV today. Will attempt to decrease sedation to both clear MS and to help with hypotension and weaning pressors. Hopefully assess for extubation this weekend, but MS will be limiting. Independent critical care time is 45 minutes.   Levy Pupa, MD, PhD 05/06/2014, 2:02 PM [facility] [phone] or if no answer [phone]

## 2014-05-06 NOTE — Progress Notes (Signed)
STROKE TEAM PROGRESS NOTE   HISTORY [name] is a 69 y.o. female with a history of atrial fibrillation and recent hospitalization for sepsis, pneumonia rhabdomyolysis as well as hypernatremia and acute kidney injury, brought to the emergency room at [facility] for increased confusion and speech abnormality. Serum sodium was 169. CT scan of her head showed low density areas involving large area of left MCA territory as well as right temporal abnormality, with appearance indicative of probable subacute infarctions. MRI was recommended but could not be obtained because of patient's agitated state. She was transferred here for further evaluation and management with stroke service intervention.  LSN: Unclear tPA Given: No: CT abnormalities is an on presentation, as well as unclear when last known well. mRankin:  SUBJECTIVE (INTERVAL HISTORY) Brother and sister in law are at bedside. Patient remains intubated and on propofol. Na 150 this am. Still has diarrhea but C. Diff negative. Still on levophed for BP which is stable overnight. Still has fever, blood culture NGTD.  OBJECTIVE Temp:  [100.1 F (37.8 C)-102.5 F (39.2 C)] 100.8 F (38.2 C) (01/09 0800) Pulse Rate:  [53-90] 67 (01/09 0830) Cardiac Rhythm:  [-] Normal sinus rhythm (01/09 0800) Resp:  [9-34] 24 (01/09 0830) BP: (83-152)/(43-92) 138/55 mmHg (01/09 0830) SpO2:  [97 %-100 %] 100 % (01/09 0830) FiO2 (%):  [45 %] 45 % (01/09 0800) Weight:  [249 lb 12.5 oz (113.3 kg)] 249 lb 12.5 oz (113.3 kg) (01/09 0530)   Recent Labs Lab 05/05/14 1602 05/05/14 2001 05/05/14 2352 05/06/14 0327 05/06/14 0813  GLUCAP 199* 178* 164* 124* 119*    Recent Labs Lab 05/04/14 0345 05/04/14 1410 05/04/14 1919 05/05/14 0500 05/06/14 0550  NA 166* 159* 156* 155* 150*  K 3.2* 4.4 3.9 3.5 3.1*  CL 129* 128* 123* 124* 120*  CO2 32 GLUCOSE 190* 270* 348* 126* 120*  BUN 37* 35* 38* 43* 50*  CREATININE 2.23* 2.10* 2.43*  2.40* 2.37*  CALCIUM 8.1* 7.5* 7.7* 7.8* 8.0*  MG  --   --   --  1.8 2.0  PHOS  --   --   --  2.2* 3.5    Recent Labs Lab 05/04/14 0345 05/05/14 0500  AST 128* 46*  ALT 38* 25  ALKPHOS 635* 487*  BILITOT 0.6 0.6  PROT 5.7* 5.9*  ALBUMIN 1.8* 1.6*    Recent Labs Lab 04/30/14 0429 05/01/14 0635 05/02/14 0410 05/03/14 0401 05/04/14 0345 05/06/14 0550  WBC 16.8* 13.6* 13.8* 13.5* 15.5* 16.8*  NEUTROABS 13.3*  --   --   --   --  13.4*  HGB 11.6* 10.7* 11.1* 11.9* 9.9* 8.7*  HCT 36.8 34.7* 36.5 38.8 33.2* 28.5*  MCV 96.6 95.9 95.8 96.8 97.1 93.4  PLT 152 122* 151 174 151 185    Recent Labs Lab 05/04/14 1410 05/04/14 1919 05/05/14 0500  TROPONINI 0.10* 0.10* 0.11*    Recent Labs  05/05/14 0500  LABPROT 16.7*  INR 1.34    Recent Labs  05/04/14 1500  COLORURINE YELLOW  LABSPEC 1.018  PHURINE 5.0  GLUCOSEU NEGATIVE  HGBUR SMALL*  BILIRUBINUR NEGATIVE  KETONESUR NEGATIVE  PROTEINUR 30*  UROBILINOGEN 0.2  NITRITE NEGATIVE  LEUKOCYTESUR SMALL*       Component Value Date/Time   CHOL 101 04/29/2014 0350   TRIG 182* 04/29/2014 0350   HDL 26* 04/29/2014 0350   CHOLHDL 3.9 04/29/2014 0350   VLDL 36 04/29/2014 0350   LDLCALC 39 04/29/2014 0350  Lab Results  Component Value Date   HGBA1C 12.1* 04/29/2014      Component Value Date/Time   LABOPIA POSITIVE* 04/18/2014 1754   COCAINSCRNUR NONE DETECTED 04/18/2014 1754   LABBENZ NONE DETECTED 04/18/2014 1754   AMPHETMU NONE DETECTED 04/18/2014 1754   THCU NONE DETECTED 04/18/2014 1754   LABBARB NONE DETECTED 04/18/2014 1754    No results for input(s): ETH in the last 168 hours.  I have personally reviewed the radiological images below and agree with the radiology interpretations.  Ct Head Wo Contrast 04/27/2014    Moderate region of decreased density in the left parietal-occipital lobe concerning for subacute infarct. There is additional questionable region of decreased density in the right temporal  lobe. Given the questioned multifocal involvement, MRI of the brain, preferably with contrast, recommended for further evaluation to exclude the possibility of underlying mass lesions.   05/02/14  Atrophy with again identified subacute infarct involving the LEFT temporoparietal region extending into the occipital lobe. No new intracranial abnormalities.  MRI not able to be done due to agitation  [facility] 04/27/2014    No acute cardiopulmonary abnormality seen.  DG Chest 1 View 04/28/2014  Mild congestive heart failure 05/02/2014 No active cardiopulmonary disease. 05/03/2014 Shallow inspiration with linear atelectasis in the right lung base. Appliances are unchanged in position. 05/04/2014 1. Stable lines and tubes. 2. Bilateral pleural effusions with lower lobe collapse/consolidation.  2D echo 04/18/14 - Left ventricle: The cavity size was normal. Wall thickness was normal. Systolic function was normal. The estimated ejection fraction was in the range of 55% to 60%. - Left atrium: The atrium was mildly dilated. Impressions: - Overall very poor image quality.  CUS -  Bilateral: 1-39% ICA stenosis. Vertebral artery flow is antegrade.   Venous doppler - There is no obvious evidence of DVT or SVT noted in the bilateral lower extremities.   EEG -  1) Asymmetric PDR. 2) irregular delta activity.  Clinical Interpretation: This EEG is most consistent with a generalized cerebral dysfunction with a superimposed area of cortical dysfunction as evidenced by attenuated voltages in the left posterior quadrant. There was no seizure or seizure predisposition recorded on this study.    PHYSICAL EXAM  Temp:  [100.1 F (37.8 C)-102.5 F (39.2 C)] 100.8 F (38.2 C) (01/09 0800) Pulse Rate:  [53-90] 67 (01/09 0830) Resp:  [9-34] 24 (01/09 0830) BP: (83-152)/(43-92) 138/55 mmHg (01/09 0830) SpO2:  [97 %-100 %] 100 % (01/09 0830) FiO2 (%):  [45 %] 45 % (01/09 0800) Weight:   [249 lb 12.5 oz (113.3 kg)] 249 lb 12.5 oz (113.3 kg) (01/09 0530)  General - morbid obese caucasian lady, intubated, eyes not open on stimulation.  Ophthalmologic - not able to test due to agitation.  Cardiovascular - Irregular rate and rhythm.  Neuro - intubated on vent with propofol, grimace on pain stimulation, not following commands.Right gaze preference but able to look to left with reflex eye movements. Possible right facial droop. PERRL, resistant to forced eye opening, move both upper extremities spontaneously and equally, mild withdraw b/l LEs on pain stimulation. Reflex 1+ and no babinski.   ASSESSMENT/PLAN [name] is a 69 y.o. female with history of atrial fibrillation, diabetes mellitus, recent sepsis, presenting with confusion and speech abnormalities from subacute Lt MCA branch infarct . She did not receive IV t-PA due to unknown time of onset.  Stroke: left MCA embolic infarcts - etiology not clear but likely due to atrial fibrillation  or endocarditis due to sepsis.   Resultant  Confusion/global aphasia  MRI (not yet performed secondary to agitation) - consider MRI once medically more stabilized.   Repeat CAT scan showed stable left MCA stroke.  Carotid Doppler - unremarkable  2D Echo EF 55-60%. Poor images. No cardiac source of emboli identified.  Hold off TEE as patient is not stable. Will consider once patient stabilized.  LE doppler - negative for DVT  LDL 39  HgbA1c 12.1, not at goal  Lovenox for VTE prophylaxis   Diet NPO time specified no liquids, on tube feeding  aspirin 81 mg orally every day prior to admission, now on aspirin 300 mg suppository daily or ASA  via PANDA  Ongoing aggressive stroke risk factor management  Therapy recommendations:  Pending  Disposition:  Pending  Respiratory failure  Increased WOB  CXR showed bilateral pleural effusions with lower lobe collapse/consolidation  Intubated and transfer to ICU  On  vanco and zosyn  CCM on board  Will do sputum culture   Septic shock  Febrile still  Low BP and given bolus and on levophed now  CXR showed lower lung consolidation  On vanco and zosyn  Low suspicious for meningitis or encephalitis, but may consider broader antibiotics coverage. CCM would consider.  Blood culture NGTD  Leukocytosis - WBC's 21.1->16.8->13.6->15.5  UA repeat showed much improved UTI  Will do sputum culture  hypernatremia - due to dehydration.  - Na 166->155.  - continue free water Q3h  - Na Q8 - CCM on board - EEG no seizure  UTI - on vanco and zosyn - Culture showed E Coli - repeat UA showed much improved UTI  ? Hx of Afib - family stated that they have not being told about hx of Afib - continue tele - multiple runs of probable MAT in the 100-160 range  - if comfirmed, she need life long anticoagulation - may consider cardiology consult once medically stabilized  Diabetes  HgbA1c 12.1 goal < 7.0  Uncontrolled  On insulin drip  SSI  Close monitoring  Nutrition  S/p PANDA   On tube feeding with trickle feeds  On free water  Dietitian following   Other Stroke Risk Factors  Advanced age  Obesity, Body mass index is 42.85 kg/(m^2).   Other Active Problems  Hypokalemia - supplement, 3.5 today  Elevated creatinine 2.23->2.43->2.40   Other Pertinent History  No regular medical follow-up for 17 years.   Hospital day # 9  This patient is critically ill due to respiratory failure, septic shock, hypernatremia, high grade fever and at significant risk of neurological worsening, death from brain edema, respiratory failure, sepsis. This patient's care requires constant monitoring of vital signs, hemodynamics, respiratory and cardiac monitoring, review of multiple databases, neurological assessment, other specialists and medical decision making of high complexity. I have updated brother at the bedside. spent 35 minutes of  neurocritical care time in the care of this patient. D/W Anders Simmonds PCCM PA Delia Heady, MD Stroke Neurology 05/06/2014 11:49 AM    To contact Stroke Continuity provider, please refer to [email or web address]. After hours, contact General Neurology

## 2014-05-06 NOTE — Progress Notes (Signed)
eLink Physician-Brief Progress Note Patient Name: Wallie Renshawlma C Schrag DOB: 03/01/1946 MRN: [id number]   Date of Service  05/06/2014  HPI/Events of Note  On vent.  Needs SUP.  eICU Interventions  Add protonix via tube.     Intervention Category Major Interventions: Other:  Tico Crotteau 05/06/2014, 8:54 PM

## 2014-05-06 NOTE — Progress Notes (Signed)
eLink Physician-Brief Progress Note Patient Name: [name] DOB: 05/03/1945 MRN: [id number]   Date of Service  05/06/2014  HPI/Events of Note  Hypokalemia  eICU Interventions  Potassium replaced     Intervention Category Minor Interventions: Electrolytes abnormality - evaluation and management  DETERDING,ELIZABETH 05/06/2014, 6:43 AM

## 2014-05-07 ENCOUNTER — Inpatient Hospital Stay (HOSPITAL_COMMUNITY): Payer: Medicare Other

## 2014-05-07 LAB — COMPREHENSIVE METABOLIC PANEL
ALK PHOS: 545 U/L — AB (ref 39–117)
ALT: 24 U/L (ref 0–35)
AST: 57 U/L — AB (ref 0–37)
Albumin: 1.5 g/dL — ABNORMAL LOW (ref 3.5–5.2)
Anion gap: 6 (ref 5–15)
BUN: 51 mg/dL — ABNORMAL HIGH (ref 6–23)
CALCIUM: 8.2 mg/dL — AB (ref 8.4–10.5)
CO2: 23 mmol/L (ref 19–32)
Chloride: 122 mEq/L — ABNORMAL HIGH (ref 96–112)
Creatinine, Ser: 2.13 mg/dL — ABNORMAL HIGH (ref 0.50–1.10)
GFR calc Af Amer: 26 mL/min — ABNORMAL LOW (ref >90)
GFR, EST NON AFRICAN AMERICAN: 23 mL/min — AB (ref >90)
Glucose, Bld: 222 mg/dL — ABNORMAL HIGH (ref 70–99)
POTASSIUM: 3 mmol/L — AB (ref 3.5–5.1)
SODIUM: 151 mmol/L — AB (ref 135–145)
Total Bilirubin: 0.3 mg/dL (ref 0.3–1.2)
Total Protein: 5.6 g/dL — ABNORMAL LOW (ref 6.0–8.3)

## 2014-05-07 LAB — GLUCOSE, CAPILLARY
GLUCOSE-CAPILLARY: 251 mg/dL — AB (ref 70–99)
Glucose-Capillary: 170 mg/dL — ABNORMAL HIGH (ref 70–99)
Glucose-Capillary: 184 mg/dL — ABNORMAL HIGH (ref 70–99)
Glucose-Capillary: 202 mg/dL — ABNORMAL HIGH (ref 70–99)
Glucose-Capillary: 209 mg/dL — ABNORMAL HIGH (ref 70–99)
Glucose-Capillary: 228 mg/dL — ABNORMAL HIGH (ref 70–99)
Glucose-Capillary: 255 mg/dL — ABNORMAL HIGH (ref 70–99)

## 2014-05-07 LAB — CBC WITH DIFFERENTIAL/PLATELET
BASOS ABS: 0 10*3/uL (ref 0.0–0.1)
Basophils Relative: 0 % (ref 0–1)
EOS PCT: 0 % (ref 0–5)
Eosinophils Absolute: 0 10*3/uL (ref 0.0–0.7)
HEMATOCRIT: 25.4 % — AB (ref 36.0–46.0)
HEMOGLOBIN: 8.1 g/dL — AB (ref 12.0–15.0)
LYMPHS ABS: 0.9 10*3/uL (ref 0.7–4.0)
LYMPHS PCT: 9 % — AB (ref 12–46)
MCH: 29.1 pg (ref 26.0–34.0)
MCHC: 31.9 g/dL (ref 30.0–36.0)
MCV: 91.4 fL (ref 78.0–100.0)
MONO ABS: 0.1 10*3/uL (ref 0.1–1.0)
Monocytes Relative: 1 % — ABNORMAL LOW (ref 3–12)
Neutro Abs: 8.7 10*3/uL — ABNORMAL HIGH (ref 1.7–7.7)
Neutrophils Relative %: 90 % — ABNORMAL HIGH (ref 43–77)
PLATELETS: 188 10*3/uL (ref 150–400)
RBC: 2.78 MIL/uL — ABNORMAL LOW (ref 3.87–5.11)
RDW: 15.3 % (ref 11.5–15.5)
WBC: 9.8 10*3/uL (ref 4.0–10.5)

## 2014-05-07 LAB — POCT I-STAT 3, ART BLOOD GAS (G3+)
Acid-base deficit: 3 mmol/L — ABNORMAL HIGH (ref 0.0–2.0)
BICARBONATE: 21.4 meq/L (ref 20.0–24.0)
O2 Saturation: 99 %
PCO2 ART: 35.2 mmHg (ref 35.0–45.0)
PO2 ART: 136 mmHg — AB (ref 80.0–100.0)
Patient temperature: 98.6
TCO2: 22 mmol/L (ref 0–100)
pH, Arterial: 7.393 (ref 7.350–7.450)

## 2014-05-07 LAB — MAGNESIUM: MAGNESIUM: 2 mg/dL (ref 1.5–2.5)

## 2014-05-07 LAB — PHOSPHORUS: Phosphorus: 3.7 mg/dL (ref 2.3–4.6)

## 2014-05-07 LAB — CORTISOL: Cortisol, Plasma: 12 ug/dL

## 2014-05-07 MED ORDER — INSULIN ASPART 100 UNIT/ML ~~LOC~~ SOLN
0.0000 [IU] | SUBCUTANEOUS | Status: DC
  Administered 2014-05-07: 11 [IU] via SUBCUTANEOUS
  Administered 2014-05-07: 7 [IU] via SUBCUTANEOUS
  Administered 2014-05-08 (×2): 3 [IU] via SUBCUTANEOUS
  Administered 2014-05-08: 11 [IU] via SUBCUTANEOUS
  Administered 2014-05-08: 3 [IU] via SUBCUTANEOUS
  Administered 2014-05-08: 4 [IU] via SUBCUTANEOUS
  Administered 2014-05-09 (×3): 7 [IU] via SUBCUTANEOUS
  Administered 2014-05-09: 11 [IU] via SUBCUTANEOUS
  Administered 2014-05-09: 4 [IU] via SUBCUTANEOUS
  Administered 2014-05-09: 7 [IU] via SUBCUTANEOUS
  Administered 2014-05-09: 4 [IU] via SUBCUTANEOUS
  Administered 2014-05-10: 11 [IU] via SUBCUTANEOUS
  Administered 2014-05-10 (×3): 4 [IU] via SUBCUTANEOUS
  Administered 2014-05-10: 3 [IU] via SUBCUTANEOUS
  Administered 2014-05-11 (×2): 11 [IU] via SUBCUTANEOUS
  Administered 2014-05-11 (×2): 4 [IU] via SUBCUTANEOUS
  Administered 2014-05-11: 11 [IU] via SUBCUTANEOUS
  Administered 2014-05-11 (×2): 4 [IU] via SUBCUTANEOUS
  Administered 2014-05-12: 7 [IU] via SUBCUTANEOUS
  Administered 2014-05-12: 3 [IU] via SUBCUTANEOUS
  Administered 2014-05-12 (×2): 7 [IU] via SUBCUTANEOUS
  Administered 2014-05-12 (×2): 4 [IU] via SUBCUTANEOUS
  Administered 2014-05-13 (×2): 3 [IU] via SUBCUTANEOUS
  Administered 2014-05-13 (×2): 4 [IU] via SUBCUTANEOUS
  Administered 2014-05-13 – 2014-05-14 (×2): 3 [IU] via SUBCUTANEOUS
  Administered 2014-05-14: 4 [IU] via SUBCUTANEOUS
  Administered 2014-05-14: 3 [IU] via SUBCUTANEOUS
  Administered 2014-05-14: 4 [IU] via SUBCUTANEOUS
  Administered 2014-05-14: 3 [IU] via SUBCUTANEOUS
  Administered 2014-05-15: 15 [IU] via SUBCUTANEOUS
  Administered 2014-05-15: 7 [IU] via SUBCUTANEOUS
  Administered 2014-05-15: 4 [IU] via SUBCUTANEOUS
  Administered 2014-05-15 – 2014-05-16 (×2): 7 [IU] via SUBCUTANEOUS
  Administered 2014-05-16 (×2): 3 [IU] via SUBCUTANEOUS
  Administered 2014-05-16: 7 [IU] via SUBCUTANEOUS
  Administered 2014-05-17: 4 [IU] via SUBCUTANEOUS
  Administered 2014-05-17 (×3): 3 [IU] via SUBCUTANEOUS

## 2014-05-07 MED ORDER — ALTEPLASE 2 MG IJ SOLR
2.0000 mg | Freq: Once | INTRAMUSCULAR | Status: AC
  Administered 2014-05-07: 2 mg
  Filled 2014-05-07: qty 2

## 2014-05-07 MED ORDER — VANCOMYCIN HCL 10 G IV SOLR
1250.0000 mg | INTRAVENOUS | Status: DC
  Administered 2014-05-08 – 2014-05-11 (×4): 1250 mg via INTRAVENOUS
  Filled 2014-05-07 (×5): qty 1250

## 2014-05-07 MED ORDER — DEXTROSE 5 % IV SOLN
INTRAVENOUS | Status: DC
  Administered 2014-05-07: 1000 mL via INTRAVENOUS
  Administered 2014-05-08: 18:00:00 via INTRAVENOUS

## 2014-05-07 MED ORDER — INSULIN ASPART 100 UNIT/ML ~~LOC~~ SOLN
4.0000 [IU] | SUBCUTANEOUS | Status: DC
  Administered 2014-05-07: 4 [IU] via SUBCUTANEOUS

## 2014-05-07 MED ORDER — ALTEPLASE 100 MG IV SOLR
2.0000 mg | Freq: Once | INTRAVENOUS | Status: AC
  Administered 2014-05-07: 2 mg
  Filled 2014-05-07: qty 2

## 2014-05-07 MED ORDER — POTASSIUM CHLORIDE 20 MEQ/15ML (10%) PO SOLN
40.0000 meq | ORAL | Status: AC
  Administered 2014-05-07 (×2): 40 meq
  Filled 2014-05-07 (×4): qty 30

## 2014-05-07 NOTE — Progress Notes (Signed)
PULMONARY / CRITICAL CARE MEDICINE   Name: [name] MRN: [id number] DOB: 09-04-1945    ADMISSION DATE:  04/27/2014 CONSULTATION DATE:  05/04/2014   REFERRING MD :  TRiad hospitalist  CHIEF COMPLAINT:  Acute encephalopathy, Sepsis, Circyulatory shock, Stroke, hypernatremia  HPI: Hx from Dr Roda Shutters of neuro 82 yof admitted initially 04/17/14 through 04/25/14  to [facility] due to Sepsis in setting of CAP (NOS) RLL on CT, HONK, A Fib RVR, rhabdo (CPK 1700s) , AKI (creat 2.7), Lactic acidosis, demand NSTEMI . AT discharge time of dc to SNF had mild hypernatermia to 154. Re-admitted on 04/27/2014 - Na 162 and stroke L MCA with UTI. Na slowly corrected and then Na up due to NPO for TEE. Developed new fever since 05/03/14 and Na was again up to 166, BP 73/d, and worsening enceph and obutndation. PCCM taking over primary 04/27/2014  SIGNIFICANT EVENTS: 04/27/2014 - admit 05/03/14 - ICU transfer 05/04/14 - PCCM primary. Intubation   VITAL SIGNS: Temp:  [98.6 F (37 C)-101.8 F (38.8 C)] 99.3 F (37.4 C) (01/10 0400) Pulse Rate:  [45-93] 74 (01/10 0900) Resp:  [16-29] 22 (01/10 0900) BP: (82-156)/(39-97) 113/53 mmHg (01/10 0900) SpO2:  [94 %-100 %] 100 % (01/10 0900) FiO2 (%):  [40 %-45 %] 40 % (01/10 0900) Weight:  [110.6 kg (243 lb 13.3 oz)] 110.6 kg (243 lb 13.3 oz) (01/10 0600) HEMODYNAMICS: CVP:  [3 mmHg-15 mmHg] 15 mmHg VENTILATOR SETTINGS: Vent Mode:  [-] PSV;CPAP FiO2 (%):  [40 %-45 %] 40 % Set Rate:  [16 bmp] 16 bmp Vt Set:  [440 mL] 440 mL PEEP:  [5 cmH20] 5 cmH20 Pressure Support:  [5 cmH20-10 cmH20] 5 cmH20 Plateau Pressure:  [14 cmH20-22 cmH20] 22 cmH20 INTAKE / OUTPUT:  Intake/Output Summary (Last 24 hours) at 05/07/14 0950 Last data filed at 05/07/14 0900  Gross per 24 hour  Intake 3108.64 ml  Output   2900 ml  Net 208.64 ml    PHYSICAL EXAMINATION: General:  Obese, critically ill looking, on full vent support  Neuro:  Awake, does not f/c agitated at times. Localizes   HEENT:  Orally intubated Cardiovascular:  Reg irreg  Lungs:scattered rhonchi Abdomen:  Obesest, Soft Musculoskeletal:  Scattered skin bruises, RT Sided PICC + GU - dirty urine in fole Skin:  Intact anteriorly  LABS: PULMONARY  Recent Labs Lab 05/03/14 0510 05/04/14 1628 05/07/14 0357  PHART 7.441 7.405 7.393  PCO2ART 43.4 39.6 35.2  PO2ART 58.0* 294.0* 136.0*  HCO3 29.1* 24.8* 21.4  TCO2 30.4 26 22   O2SAT 88.6 100.0 99.0   CBC  Recent Labs Lab 05/04/14 0345 05/06/14 0550 05/07/14 0530  HGB 9.9* 8.7* 8.1*  HCT 33.2* 28.5* 25.4*  WBC 15.5* 16.8* 9.8  PLT 151 185 188   COAGULATION  Recent Labs Lab 05/05/14 0500  INR 1.34    CARDIAC  Recent Labs Lab 05/04/14 1410 05/04/14 1919 05/05/14 0500  TROPONINI 0.10* 0.10* 0.11*   No results for input(s): PROBNP in the last 168 hours.   CHEMISTRY  Recent Labs Lab 05/04/14 1410 05/04/14 1919 05/05/14 0500 05/06/14 0550 05/07/14 0530  NA 159* 156* 155* 150* 151*  K 4.4 3.9 3.5 3.1* 3.0*  CL 128* 123* 124* 120* 122*  CO2 GLUCOSE 270* 348* 126* 120* 222*  BUN 35* 38* 43* 50* 51*  CREATININE 2.10* 2.43* 2.40* 2.37* 2.13*  CALCIUM 7.5* 7.7* 7.8* 8.0* 8.2*  MG  --   --  1.8 2.0 2.0  PHOS  --   --  2.2* 3.5 3.7   Estimated Creatinine Clearance: 30.3 mL/min (by C-G formula based on Cr of 2.13).   LIVER  Recent Labs Lab 05/04/14 0345 05/05/14 0500 05/07/14 0530  AST 128* 46* 57*  ALT 38* 25 24  ALKPHOS 635* 487* 545*  BILITOT 0.6 0.6 0.3  PROT 5.7* 5.9* 5.6*  ALBUMIN 1.8* 1.6* 1.5*  INR  --  1.34  --    INFECTIOUS  Recent Labs Lab 05/04/14 1410 05/05/14 0500 05/06/14 0550  LATICACIDVEN 2.3*  --   --   PROCALCITON 1.24 3.74 3.31   ENDOCRINE CBG (last 3)   Recent Labs  05/07/14 0012 05/07/14 0352 05/07/14 0808  GLUCAP 184* 209* 202*   IMAGING x48h Dg Chest Port 1 View  05/07/2014   CLINICAL DATA:  Aspiration pneumonia  EXAM: PORTABLE CHEST - 1 VIEW  COMPARISON:   05/06/2014  FINDINGS: Patient is rotated.  Left lung base is obscured, but this may reflect overlying soft tissues. Lungs are otherwise clear. No pleural effusion or pneumothorax.  Endotracheal tube terminates 6 cm above the carina.  Right arm PICC terminates in the mid SVC. Enteric tube courses into the stomach.  IMPRESSION: Endotracheal tube terminates 6 cm above the carina.   Electronically Signed   By: Charline Bills M.D.   On: 05/07/2014 07:35   Dg Chest Port 1 View  05/06/2014   CLINICAL DATA:  Follow up ventilated patient.  Subsequent encounter.  EXAM: PORTABLE CHEST - 1 VIEW  COMPARISON:  Chest radiograph performed 05/05/2014  FINDINGS: The patient's endotracheal tube is seen ending 4 cm above the carina. A right PICC is noted ending about the proximal to mid SVC. A left IJ line is seen ending about the mid SVC. Evaluation is suboptimal due to patient rotation.  Left basilar airspace opacification is again noted, raising concern for pneumonia. A small left pleural effusion is suspected. No pneumothorax is seen. The right lung is not well assessed due to patient rotation.  The cardiomediastinal silhouette is borderline normal in size. No acute osseous abnormalities are seen.  IMPRESSION: Persistent left basilar airspace opacification raises concern for pneumonia. Suspect small left pleural effusion.   Electronically Signed   By: Roanna Raider M.D.   On: 05/06/2014 07:36   Dg Chest Port 1 View  05/05/2014   CLINICAL DATA:  Followup endotracheal tube  EXAM: PORTABLE CHEST - 1 VIEW  COMPARISON:  05/04/2014  FINDINGS: Cardiomediastinal silhouette is stable. Endotracheal tube with tip 4.3 cm above the carina. Stable right arm PICC line position. No convincing pulmonary edema. Persistent left base retrocardiac atelectasis or infiltrate. No pneumothorax. NG tube is unchanged in position.  IMPRESSION: Stable support apparatus. No convincing pulmonary edema. No pneumothorax. Persistent left base retrocardiac  atelectasis or infiltrate.   Electronically Signed   By: Natasha Mead M.D.   On: 05/05/2014 10:41    ASSESSMENT / PLAN:  PULMONARY OETT 05/04/2014  A:  Acute resp failure to encephalopathy and possible HCAP vs aspiration  Stable, tolerating PSV, but MS not ready for extubation. CXR improved  P:   Full vent support-->wean as tolerated.  VAP protocol See ID  F/u pcxr and abg in am   CARDIOVASCULAR CVLRt UE picc A:  Circulatory shock - ? Due to dehydration fron high Na v sepsis v both > pressors off P:  Cont stress dose steroids Keep euvolemic  D/c pressors   RENAL  A:   Hypernatremia, improving Acute Renal Failure-->slightly  improved  Hypokalemia  P:   Adjust free water  K replaced, recheck in am  MAP goal > 65 F/u chem in am    GASTROINTESTINAL A:   Mental status precludes diet P:   NPO Tube feeds per nutrition   HEMATOLOGIC A:   Anemia of critical illness P:  PRBC for hgb < 7gm% Lovenox for dvt proph  INFECTIOUS 12.31.16 - MRSA PCR - neg 1./1 - blodo culture - negative 04/30/14 - urine culture - e coli - sensit to ceftriaxone 05/03/14 - blood negative 05/03/14 - MRSA PCR - negative 1/7 c diff: neg  A:   UTI septic shock +/- HCAP P:   vanc 1/9>>> Cefepime 1/9>>> Will complete 7 days   ENDOCRINE A:   DM Relative adrenal insuff  P:   ICU hyperglycemia protocol Cont stress dose steroids  NEUROLOGIC A:   Comatose due to hypernatremia. Subacute CVA  P:   Fent prn for sedation RASS goal 0  Neuro helping MRI per neuro; will get this after off pressors   Need TEE has been ordered. Cards following. Likely Monday   FAMILY  - Updates:  Bedside - friend updated. Acts as surrogate for family  - Inter-disciplinary family meet or Palliative Care meeting due by:  05/11/14   NP SUMMARY Off pressors. Her hypernatremia and AKI are slowly improving.  Suspect aspiration in setting of metabolic encephalopathy superimposed on subacute CVA. She is tolerating  PSV, but her MS precluded extubation at this point. Cont abx  Complete 7d. She has TEE ordered and pending. Will eventually need MRI. Need to improve metabolic status. Will adjust free water.    Simonne MartinetPeter E Babcock ACNP-BC [facility] Pulmonary/Critical Care Pager # [phone] OR # [phone] if no answer 05/07/2014 9:50 AM  Attending Note:  I have examined patient, reviewed labs, studies and notes. I have discussed the case with Kreg ShropshireP Babcock, and I agree with the data and plans as amended above. She has VDRF, extubation now primarily prevented by mental status. Hypernatremia improving slowly. Will continue efforts to wean. Independent critical care time is 35 minutes.   Levy Pupaobert Martice Doty, MD, PhD 05/07/2014, 6:11 PM [facility] [phone] or if no answer [phone]

## 2014-05-07 NOTE — Progress Notes (Signed)
ANTIBIOTIC CONSULT NOTE - FOLLOW UP  Pharmacy Consult for vancomycin, cefepime Indication: HCAP  Allergies  Allergen Reactions  . Morphine And Related     Patient Measurements: Height: 5\' 4"  (162.6 cm) Weight: 243 lb 13.3 oz (110.6 kg) IBW/kg (Calculated) : 54.7 Vital Signs: Temp: 98.6 F (37 C) (01/10 1135) Temp Source: Axillary (01/10 1135) BP: 123/63 mmHg (01/10 1100) Pulse Rate: 72 (01/10 1100) Intake/Output from previous day: 01/09 0701 - 01/10 0700 In: 3153.4 [I.V.:193.4; NG/GT:2910; IV Piggyback:50] Out: 3100 [Urine:2200; Stool:900] Intake/Output from this shift: Total I/O In: 350 [NG/GT:350] Out: -   Labs:  Recent Labs  05/05/14 0500 05/06/14 0550 05/07/14 0530  WBC  --  16.8* 9.8  HGB  --  8.7* 8.1*  PLT  --  185 188  CREATININE 2.40* 2.37* 2.13*   Estimated Creatinine Clearance: 30.3 mL/min (by C-G formula based on Cr of 2.13). No results for input(s): VANCOTROUGH, VANCOPEAK, VANCORANDOM, GENTTROUGH, GENTPEAK, GENTRANDOM, TOBRATROUGH, TOBRAPEAK, TOBRARND, AMIKACINPEAK, AMIKACINTROU, AMIKACIN in the last 72 hours.   Microbiology: Recent Results (from the past 720 hour(s))  Culture, blood (routine x 2)     Status: None   Collection Time: 04/17/14  7:59 PM  Result Value Ref Range Status   Specimen Description BLOOD RIGHT ANTECUBITAL  Final   Special Requests BOTTLES DRAWN AEROBIC AND ANAEROBIC 5ML  Final   Culture  Setup Time   Final    04/18/2014 02:56 Performed at [facility]    Culture   Final    NO GROWTH 5 DAYS Performed at [facility]    Report Status 05/02/2014 FINAL  Final  Culture, blood (routine x 2)     Status: None   Collection Time: 04/17/14  8:33 PM  Result Value Ref Range Status   Specimen Description BLOOD R HAND  Final   Special Requests BOTTLES DRAWN AEROBIC AND ANAEROBIC 7ML  Final   Culture  Setup Time   Final    04/18/2014 02:58 Performed at [facility]    Culture   Final    NO GROWTH 5  DAYS Performed at [facility]    Report Status 05/02/2014 FINAL  Final  Urine culture     Status: None   Collection Time: 04/17/14  8:47 PM  Result Value Ref Range Status   Specimen Description URINE, CATHETERIZED  Final   Special Requests Normal  Final   Culture  Setup Time   Final    04/18/2014 04:23 Performed at [facility]    Colony Count NO GROWTH Performed at [facility]   Final   Culture NO GROWTH Performed at [facility]   Final   Report Status 04/19/2014 FINAL  Final  MRSA PCR Screening     Status: None   Collection Time: 04/18/14  3:55 AM  Result Value Ref Range Status   MRSA by PCR NEGATIVE NEGATIVE Final    Comment:        The GeneXpert MRSA Assay (FDA approved for NASAL specimens only), is one component of a comprehensive MRSA colonization surveillance program. It is not intended to diagnose MRSA infection nor to guide or monitor treatment for MRSA infections.   Clostridium Difficile by PCR     Status: None   Collection Time: 04/23/14  7:07 AM  Result Value Ref Range Status   C difficile by pcr NEGATIVE NEGATIVE Final    Comment: [facility]  MRSA PCR Screening     Status:  None   Collection Time: 04/27/14  7:04 PM  Result Value Ref Range Status   MRSA by PCR NEGATIVE NEGATIVE Final    Comment:        The GeneXpert MRSA Assay (FDA approved for NASAL specimens only), is one component of a comprehensive MRSA colonization surveillance program. It is not intended to diagnose MRSA infection nor to guide or monitor treatment for MRSA infections.   Culture, blood (routine x 2)     Status: None   Collection Time: 04/28/14  9:41 AM  Result Value Ref Range Status   Specimen Description BLOOD RIGHT HAND  Final   Special Requests BOTTLES DRAWN AEROBIC ONLY 2CC  Final   Culture   Final    NO GROWTH 5 DAYS Performed at [facility]    Report Status 05/04/2014 FINAL  Final  Culture, blood  (routine x 2)     Status: None   Collection Time: 04/28/14  9:50 AM  Result Value Ref Range Status   Specimen Description BLOOD LEFT HAND  Final   Special Requests BOTTLES DRAWN AEROBIC ONLY 1CC  Final   Culture   Final    NO GROWTH 5 DAYS Performed at [facility]    Report Status 05/04/2014 FINAL  Final  Culture, Urine     Status: None   Collection Time: 04/30/14 12:36 PM  Result Value Ref Range Status   Specimen Description URINE, CATHETERIZED  Final   Special Requests NONE  Final   Colony Count   Final    >=100,000 COLONIES/ML Performed at [facility]    Culture   Final    ESCHERICHIA COLI Performed at [facility]    Report Status 05/02/2014 FINAL  Final   Organism ID, Bacteria ESCHERICHIA COLI  Final      Susceptibility   Escherichia coli - MIC*    AMPICILLIN >=32 RESISTANT Resistant     CEFAZOLIN <=4 SENSITIVE Sensitive     CEFTRIAXONE <=1 SENSITIVE Sensitive     CIPROFLOXACIN >=4 RESISTANT Resistant     GENTAMICIN <=1 SENSITIVE Sensitive     LEVOFLOXACIN >=8 RESISTANT Resistant     NITROFURANTOIN <=16 SENSITIVE Sensitive     TOBRAMYCIN <=1 SENSITIVE Sensitive     TRIMETH/SULFA >=320 RESISTANT Resistant     PIP/TAZO <=4 SENSITIVE Sensitive     * ESCHERICHIA COLI  Culture, blood (routine x 2)     Status: None (Preliminary result)   Collection Time: 05/03/14  4:01 AM  Result Value Ref Range Status   Specimen Description BLOOD LEFT ARM  Final   Special Requests BOTTLES DRAWN AEROBIC ONLY 2CC  Final   Culture   Final           BLOOD CULTURE RECEIVED NO GROWTH TO DATE CULTURE WILL BE HELD FOR 5 DAYS BEFORE ISSUING A FINAL NEGATIVE REPORT Note: Culture results may be compromised due to an inadequate volume of blood received in culture bottles. Performed at [facility]    Report Status PENDING  Incomplete  Culture, blood (routine x 2)     Status: None (Preliminary result)   Collection Time: 05/03/14  4:09 AM  Result Value Ref Range  Status   Specimen Description BLOOD LEFT FOREARM  Final   Special Requests BOTTLES DRAWN AEROBIC ONLY 10CC  Final   Culture   Final           BLOOD CULTURE RECEIVED NO GROWTH TO DATE CULTURE WILL BE HELD FOR 5 DAYS BEFORE  ISSUING A FINAL NEGATIVE REPORT Note: Culture results may be compromised due to an excessive volume of blood received in culture bottles. Performed at [facility]    Report Status PENDING  Incomplete  MRSA PCR Screening     Status: None   Collection Time: 05/03/14  7:19 AM  Result Value Ref Range Status   MRSA by PCR NEGATIVE NEGATIVE Final    Comment:        The GeneXpert MRSA Assay (FDA approved for NASAL specimens only), is one component of a comprehensive MRSA colonization surveillance program. It is not intended to diagnose MRSA infection nor to guide or monitor treatment for MRSA infections.   Clostridium Difficile by PCR     Status: None   Collection Time: 05/04/14  3:41 PM  Result Value Ref Range Status   C difficile by pcr NEGATIVE NEGATIVE Final    Anti-infectives    Start     Dose/Rate Route Frequency Ordered Stop   05/05/14 1200  ceFEPIme (MAXIPIME) 1 g in dextrose 5 % 50 mL IVPB     1 g100 mL/hr over 30 Minutes Intravenous Every 24 hours 05/05/14 1039     05/05/14 1045  vancomycin (VANCOCIN) 1,500 mg in sodium chloride 0.9 % 500 mL IVPB     1,500 mg250 mL/hr over 120 Minutes Intravenous Every 48 hours 05/03/14 1039     05/03/14 2200  piperacillin-tazobactam (ZOSYN) IVPB 3.375 g  Status:  Discontinued     3.375 g12.5 mL/hr over 240 Minutes Intravenous 3 times per day 05/03/14 1039 05/05/14 1014   05/03/14 1045  piperacillin-tazobactam (ZOSYN) IVPB 3.375 g     3.375 g100 mL/hr over 30 Minutes Intravenous  Once 05/03/14 1039 05/03/14 1213   05/03/14 1045  vancomycin (VANCOCIN) 2,000 mg in sodium chloride 0.9 % 500 mL IVPB     2,000 mg250 mL/hr over 120 Minutes Intravenous  Once 05/03/14 1039 05/03/14 1350   05/02/14 1015  cefTRIAXone  (ROCEPHIN) 2 g in dextrose 5 % 50 mL IVPB - Premix  Status:  Discontinued     2 g100 mL/hr over 30 Minutes Intravenous Every 24 hours 05/02/14 1006 05/03/14 1023      Assessment: 69 year old female on day # 5/7 of vancomycin and cefepime for HCAP. WBC is trending down. Tmax is 101.8 but has been afebrile since 2300PM. PCT is trending down.   SCr is improving - currently 2.13 with normalized CrCl~ 33 mL/min.   1/7 Cdiff: negative 1/6 Blood >> ngtd 1/3 Urine >> Ecoli (R-Amp, Cipro, Levo, Bactrim; S-Ancef/CTX, gent, ntf, zosyn, tobra) 1/1 Blood >> negative  Goal of Therapy:  Vancomycin trough level 15-20 mcg/ml  Plan:  Increase vancomycin to  IV every 24 hours. Continue cefepime 1g IV every 24 hours.  Monitor renal function, culture results, and clinical status.  Follow-up duration of therapy.   Link Snuffer, PharmD, BCPS Clinical Pharmacist [phone]  05/07/2014,12:00 PM

## 2014-05-07 NOTE — Progress Notes (Signed)
eLink Physician-Brief Progress Note Patient Name: [name] DOB: 04/20/1946 MRN: [id number]   Date of Service  05/07/2014  HPI/Events of Note  hypokalemia  eICU Interventions  Potassium replaced     Intervention Category Intermediate Interventions: Electrolyte abnormality - evaluation and management  DETERDING,ELIZABETH 05/07/2014, 6:40 AM

## 2014-05-07 NOTE — Progress Notes (Signed)
STROKE TEAM PROGRESS NOTE   HISTORY [name] is a 69 y.o. female with a history of atrial fibrillation and recent hospitalization for sepsis, pneumonia rhabdomyolysis as well as hypernatremia and acute kidney injury, brought to the emergency room at [facility] for increased confusion and speech abnormality. Serum sodium was 169. CT scan of her head showed low density areas involving large area of left MCA territory as well as right temporal abnormality, with appearance indicative of probable subacute infarctions. MRI was recommended but could not be obtained because of patient's agitated state. She was transferred here for further evaluation and management with stroke service intervention.  LSN: Unclear tPA Given: No: CT abnormalities is an on presentation, as well as unclear when last known well. mRankin:  SUBJECTIVE (INTERVAL HISTORY) Brother and sister in law are at bedside. Patient remains intubated and on propofol. Na 151 this am. Still has diarrhea but C. Diff negative. Still on levophed for BP which is stable overnight. Still has fever, blood culture NGTD.  OBJECTIVE Temp:  [98.6 F (37 C)-101.8 F (38.8 C)] 98.6 F (37 C) (01/10 1135) Pulse Rate:  [45-93] 72 (01/10 1100) Cardiac Rhythm:  [-] Normal sinus rhythm (01/10 0800) Resp:  [16-29] 18 (01/10 1100) BP: (82-156)/(39-97) 123/63 mmHg (01/10 1100) SpO2:  [94 %-100 %] 100 % (01/10 1100) FiO2 (%):  [40 %-45 %] 40 % (01/10 0900) Weight:  [243 lb 13.3 oz (110.6 kg)] 243 lb 13.3 oz (110.6 kg) (01/10 0600)   Recent Labs Lab 05/06/14 1548 05/06/14 1952 05/07/14 0012 05/07/14 0352 05/07/14 0808  GLUCAP 170* 201* 184* 209* 202*    Recent Labs Lab 05/04/14 1410 05/04/14 1919 05/05/14 0500 05/06/14 0550 05/07/14 0530  NA 159* 156* 155* 150* 151*  K 4.4 3.9 3.5 3.1* 3.0*  CL 128* 123* 124* 120* 122*  CO2 GLUCOSE 270* 348* 126* 120* 222*  BUN 35* 38* 43* 50* 51*  CREATININE 2.10* 2.43* 2.40*  2.37* 2.13*  CALCIUM 7.5* 7.7* 7.8* 8.0* 8.2*  MG  --   --  1.8 2.0 2.0  PHOS  --   --  2.2* 3.5 3.7    Recent Labs Lab 05/04/14 0345 05/05/14 0500 05/07/14 0530  AST 128* 46* 57*  ALT 38* 25 24  ALKPHOS 635* 487* 545*  BILITOT 0.6 0.6 0.3  PROT 5.7* 5.9* 5.6*  ALBUMIN 1.8* 1.6* 1.5*    Recent Labs Lab 05/02/14 0410 05/03/14 0401 05/04/14 0345 05/06/14 0550 05/07/14 0530  WBC 13.8* 13.5* 15.5* 16.8* 9.8  NEUTROABS  --   --   --  13.4* 8.7*  HGB 11.1* 11.9* 9.9* 8.7* 8.1*  HCT 36.5 38.8 33.2* 28.5* 25.4*  MCV 95.8 96.8 97.1 93.4 91.4  PLT 151 174 151 185 188    Recent Labs Lab 05/04/14 1410 05/04/14 1919 05/05/14 0500  TROPONINI 0.10* 0.10* 0.11*    Recent Labs  05/05/14 0500  LABPROT 16.7*  INR 1.34    Recent Labs  05/04/14 1500  COLORURINE YELLOW  LABSPEC 1.018  PHURINE 5.0  GLUCOSEU NEGATIVE  HGBUR SMALL*  BILIRUBINUR NEGATIVE  KETONESUR NEGATIVE  PROTEINUR 30*  UROBILINOGEN 0.2  NITRITE NEGATIVE  LEUKOCYTESUR SMALL*       Component Value Date/Time   CHOL 101 04/29/2014 0350   TRIG 182* 04/29/2014 0350   HDL 26* 04/29/2014 0350   CHOLHDL 3.9 04/29/2014 0350   VLDL 36 04/29/2014 0350   LDLCALC 39 04/29/2014 0350   Lab Results  Component Value Date   HGBA1C 12.1* 04/29/2014      Component Value Date/Time   LABOPIA POSITIVE* 04/18/2014 1754   COCAINSCRNUR NONE DETECTED 04/18/2014 1754   LABBENZ NONE DETECTED 04/18/2014 1754   AMPHETMU NONE DETECTED 04/18/2014 1754   THCU NONE DETECTED 04/18/2014 1754   LABBARB NONE DETECTED 04/18/2014 1754    No results for input(s): ETH in the last 168 hours.  I have personally reviewed the radiological images below and agree with the radiology interpretations.  Ct Head Wo Contrast 04/27/2014    Moderate region of decreased density in the left parietal-occipital lobe concerning for subacute infarct. There is additional questionable region of decreased density in the right temporal lobe. Given  the questioned multifocal involvement, MRI of the brain, preferably with contrast, recommended for further evaluation to exclude the possibility of underlying mass lesions.   05/02/14  Atrophy with again identified subacute infarct involving the LEFT temporoparietal region extending into the occipital lobe. No new intracranial abnormalities.  MRI not able to be done due to agitation  [facility] 04/27/2014    No acute cardiopulmonary abnormality seen.  DG Chest 1 View 04/28/2014  Mild congestive heart failure 05/02/2014 No active cardiopulmonary disease. 05/03/2014 Shallow inspiration with linear atelectasis in the right lung base. Appliances are unchanged in position. 05/04/2014 1. Stable lines and tubes. 2. Bilateral pleural effusions with lower lobe collapse/consolidation.  2D echo 04/18/14 - Left ventricle: The cavity size was normal. Wall thickness was normal. Systolic function was normal. The estimated ejection fraction was in the range of 55% to 60%. - Left atrium: The atrium was mildly dilated. Impressions: - Overall very poor image quality.  CUS -  Bilateral: 1-39% ICA stenosis. Vertebral artery flow is antegrade.   Venous doppler - There is no obvious evidence of DVT or SVT noted in the bilateral lower extremities.   EEG -  1) Asymmetric PDR. 2) irregular delta activity.  Clinical Interpretation: This EEG is most consistent with a generalized cerebral dysfunction with a superimposed area of cortical dysfunction as evidenced by attenuated voltages in the left posterior quadrant. There was no seizure or seizure predisposition recorded on this study.    PHYSICAL EXAM  Temp:  [98.6 F (37 C)-101.8 F (38.8 C)] 98.6 F (37 C) (01/10 1135) Pulse Rate:  [45-93] 72 (01/10 1100) Resp:  [16-29] 18 (01/10 1100) BP: (82-156)/(39-97) 123/63 mmHg (01/10 1100) SpO2:  [94 %-100 %] 100 % (01/10 1100) FiO2 (%):  [40 %-45 %] 40 % (01/10 0900) Weight:  [243 lb  13.3 oz (110.6 kg)] 243 lb 13.3 oz (110.6 kg) (01/10 0600)  General - morbid obese caucasian lady, intubated, eyes not open on stimulation.  Ophthalmologic - not able to test due to agitation.  Cardiovascular - Irregular rate and rhythm.  Neuro - intubated on vent with propofol, grimace on pain stimulation, not following commands.Right gaze preference now resolved but able to look to left with reflex eye movements. Possible right facial droop. PERRL, resistant to forced eye opening, move both upper extremities spontaneously and equally, mild withdraw b/l LEs on pain stimulation. Reflex 1+ and no babinski.   ASSESSMENT/PLAN [name] is a 69 y.o. female with history of atrial fibrillation, diabetes mellitus, recent sepsis, presenting with confusion and speech abnormalities from subacute Lt MCA branch infarct . She did not receive IV t-PA due to unknown time of onset.  Stroke: left MCA embolic infarcts - etiology not clear but likely due to atrial fibrillation  or endocarditis due to sepsis.   Resultant  Confusion/global aphasia  MRI (not yet performed secondary to agitation) - consider MRI once medically more stabilized.   Repeat CAT scan showed stable left MCA stroke.  Carotid Doppler - unremarkable  2D Echo EF 55-60%. Poor images. No cardiac source of emboli identified.  Hold off TEE as patient is not stable. Will consider once patient stabilized.  LE doppler - negative for DVT  LDL 39  HgbA1c 12.1, not at goal  Lovenox for VTE prophylaxis   Diet NPO time specified no liquids, on tube feeding  aspirin 81 mg orally every day prior to admission, now on aspirin 300 mg suppository daily or ASA  via PANDA  Ongoing aggressive stroke risk factor management  Therapy recommendations:  Pending  Disposition:  Pending  Respiratory failure  Increased WOB  CXR showed bilateral pleural effusions with lower lobe collapse/consolidation  Intubated and transfer to  ICU  On vanco and zosyn  CCM on board  Will do sputum culture   Septic shock  Febrile still  Low BP and given bolus and on levophed now  CXR showed lower lung consolidation  On vanco and zosyn  Low suspicious for meningitis or encephalitis, but may consider broader antibiotics coverage. CCM would consider.  Blood culture NGTD  Leukocytosis - WBC's 21.1->16.8->13.6->15.5  UA repeat showed much improved UTI  Will do sputum culture  hypernatremia - due to dehydration.  - Na 166->155.  - continue free water Q3h  - Na Q8 - CCM on board - EEG no seizure  UTI - on vanco and zosyn - Culture showed E Coli - repeat UA showed much improved UTI  ? Hx of Afib - family stated that they have not being told about hx of Afib - continue tele - multiple runs of probable MAT in the 100-160 range  - if comfirmed, she need life long anticoagulation - may consider cardiology consult once medically stabilized  Diabetes  HgbA1c 12.1 goal < 7.0  Uncontrolled  On insulin drip  SSI  Close monitoring  Nutrition  S/p PANDA   On tube feeding with trickle feeds  On free water  Dietitian following   Other Stroke Risk Factors  Advanced age  Obesity, Body mass index is 41.83 kg/(m^2).   Other Active Problems  Hypokalemia - supplement, 3.5 today  Elevated creatinine 2.23->2.43->2.40   Other Pertinent History  No regular medical follow-up for 17 years.   Hospital day # 10   Discussed with Anders Simmonds, PCCM PA. Patient is yet unstable for MRI and TEE and delirium is multifactorial and improving slowing hence will hold of on above tests and stroke team will follow from a distance. Call if needed.I have updated brother at the bedside.   Delia Heady, MD Stroke Neurology 05/07/2014 11:45 AM    To contact Stroke Continuity provider, please refer to [email or web address]. After hours, contact General Neurology

## 2014-05-08 ENCOUNTER — Inpatient Hospital Stay (HOSPITAL_COMMUNITY): Payer: Medicare Other

## 2014-05-08 LAB — BASIC METABOLIC PANEL
Anion gap: 6 (ref 5–15)
Anion gap: 8 (ref 5–15)
BUN: 58 mg/dL — ABNORMAL HIGH (ref 6–23)
BUN: 56 mg/dL — ABNORMAL HIGH (ref 6–23)
CO2: 20 mmol/L (ref 19–32)
CO2: 18 mmol/L — ABNORMAL LOW (ref 19–32)
CREATININE: 1.82 mg/dL — AB (ref 0.50–1.10)
CREATININE: 1.7 mg/dL — AB (ref 0.50–1.10)
Calcium: 8.3 mg/dL — ABNORMAL LOW (ref 8.4–10.5)
Calcium: 8.4 mg/dL (ref 8.4–10.5)
Chloride: 121 mEq/L — ABNORMAL HIGH (ref 96–112)
Chloride: 120 mEq/L — ABNORMAL HIGH (ref 96–112)
GFR calc Af Amer: 34 mL/min — ABNORMAL LOW (ref >90)
GFR calc non Af Amer: 30 mL/min — ABNORMAL LOW (ref >90)
GFR, EST AFRICAN AMERICAN: 32 mL/min — AB (ref >90)
GFR, EST NON AFRICAN AMERICAN: 27 mL/min — AB (ref >90)
GLUCOSE: 170 mg/dL — AB (ref 70–99)
Glucose, Bld: 153 mg/dL — ABNORMAL HIGH (ref 70–99)
POTASSIUM: 2.5 mmol/L — AB (ref 3.5–5.1)
Potassium: 3.4 mmol/L — ABNORMAL LOW (ref 3.5–5.1)
SODIUM: 147 mmol/L — AB (ref 135–145)
Sodium: 146 mmol/L — ABNORMAL HIGH (ref 135–145)

## 2014-05-08 LAB — MAGNESIUM: Magnesium: 1.9 mg/dL (ref 1.5–2.5)

## 2014-05-08 LAB — CBC WITH DIFFERENTIAL/PLATELET
BASOS ABS: 0 10*3/uL (ref 0.0–0.1)
BASOS PCT: 0 % (ref 0–1)
Eosinophils Absolute: 0 10*3/uL (ref 0.0–0.7)
Eosinophils Relative: 0 % (ref 0–5)
HEMATOCRIT: 25.4 % — AB (ref 36.0–46.0)
Hemoglobin: 8.1 g/dL — ABNORMAL LOW (ref 12.0–15.0)
LYMPHS ABS: 0.8 10*3/uL (ref 0.7–4.0)
Lymphocytes Relative: 6 % — ABNORMAL LOW (ref 12–46)
MCH: 28.7 pg (ref 26.0–34.0)
MCHC: 31.9 g/dL (ref 30.0–36.0)
MCV: 90.1 fL (ref 78.0–100.0)
Monocytes Absolute: 0.3 10*3/uL (ref 0.1–1.0)
Monocytes Relative: 3 % (ref 3–12)
Neutro Abs: 10.9 10*3/uL — ABNORMAL HIGH (ref 1.7–7.7)
Neutrophils Relative %: 91 % — ABNORMAL HIGH (ref 43–77)
Platelets: 292 10*3/uL (ref 150–400)
RBC: 2.82 MIL/uL — AB (ref 3.87–5.11)
RDW: 15.2 % (ref 11.5–15.5)
WBC: 12 10*3/uL — AB (ref 4.0–10.5)

## 2014-05-08 LAB — GLUCOSE, CAPILLARY
GLUCOSE-CAPILLARY: 155 mg/dL — AB (ref 70–99)
GLUCOSE-CAPILLARY: 259 mg/dL — AB (ref 70–99)
Glucose-Capillary: 136 mg/dL — ABNORMAL HIGH (ref 70–99)
Glucose-Capillary: 132 mg/dL — ABNORMAL HIGH (ref 70–99)
Glucose-Capillary: 143 mg/dL — ABNORMAL HIGH (ref 70–99)

## 2014-05-08 LAB — PHOSPHORUS: Phosphorus: 3.8 mg/dL (ref 2.3–4.6)

## 2014-05-08 MED ORDER — POTASSIUM CHLORIDE 20 MEQ/15ML (10%) PO SOLN
40.0000 meq | Freq: Once | ORAL | Status: AC
  Administered 2014-05-08: 40 meq
  Filled 2014-05-08: qty 30

## 2014-05-08 MED ORDER — FUROSEMIDE 10 MG/ML IJ SOLN
40.0000 mg | Freq: Four times a day (QID) | INTRAMUSCULAR | Status: AC
  Administered 2014-05-08 (×3): 40 mg via INTRAVENOUS
  Filled 2014-05-08 (×3): qty 4

## 2014-05-08 MED ORDER — POTASSIUM CHLORIDE 10 MEQ/50ML IV SOLN
10.0000 meq | INTRAVENOUS | Status: AC
  Administered 2014-05-08 (×4): 10 meq via INTRAVENOUS
  Filled 2014-05-08 (×2): qty 50

## 2014-05-08 MED ORDER — MAGNESIUM SULFATE 50 % IJ SOLN
2.0000 g | Freq: Once | INTRAVENOUS | Status: DC
  Filled 2014-05-08: qty 4

## 2014-05-08 MED ORDER — MAGNESIUM SULFATE 2 GM/50ML IV SOLN
2.0000 g | Freq: Once | INTRAVENOUS | Status: AC
  Administered 2014-05-08: 2 g via INTRAVENOUS
  Filled 2014-05-08: qty 50

## 2014-05-08 NOTE — Clinical Social Work Note (Signed)
Clinical Social Child psychotherapistocial Worker received notification from RN that [facility] is from [facility].  Patient is now intubated and sedated with the possibility for tracheostomy placement.  CSW has requested LTAC consult with patient progression if unable to wean from the ventilator.  CSW will continue to follow alongside for family support and completion of full assessment if deemed necessary.  Macario GoldsJesse Clarivel Callaway, Kentucky[place]CSW [phone]

## 2014-05-08 NOTE — Progress Notes (Signed)
eLink Physician-Brief Progress Note Patient Name: [name] DOB: 08/25/1945 MRN: [id number]   Date of Service  05/08/2014  HPI/Events of Note  Low k , crt 2.  eICU Interventions  k supp then recheck bmet at noon supp mag     Intervention Category Major Interventions: Electrolyte abnormality - evaluation and management  Rolando Whitby J. 05/08/2014, 6:41 AM

## 2014-05-08 NOTE — Progress Notes (Signed)
eLink Physician-Brief Progress Note Patient Name: [name] DOB: 03/26/1946 MRN: [id number]   Date of Service  05/08/2014  HPI/Events of Note  Agitation , may harm self  eICU Interventions  restraint     Intervention Category Minor Interventions: Routine modifications to care plan (e.g. PRN medications for pain, fever)  Nelda BucksFEINSTEIN,DANIEL J. 05/08/2014, 12:42 AM

## 2014-05-08 NOTE — Progress Notes (Signed)
CRITICAL VALUE ALERT  Critical value received:  2.5  Date of notification:  1/11  Time of notification:  0635  Critical value read back:Yes.    Nurse who received alert:  T. Davonna BellingHutton  MD notified (1st page): ELINK  Time of first page:  [phone]  MD notified (2nd page):  Time of second page:  Responding MD:  Dr. Tyson AliasFeinstein  Time MD responded:  [phone]

## 2014-05-08 NOTE — Progress Notes (Signed)
PULMONARY / CRITICAL CARE MEDICINE   Name: [name] C Truman MRN: [id number] DOB: 11/17/1945    ADMISSION DATE:  04/27/2014 CONSULTATION DATE:  05/04/2014   REFERRING MD :  TRiad hospitalist  CHIEF COMPLAINT:  Acute encephalopathy, Sepsis, Circyulatory shock, Stroke, hypernatremia  HPI: Hx from Dr Roda ShuttersXu of neuro [age] yof admitted initially 04/17/14 through 04/25/14  to [place] due to Sepsis in setting of CAP (NOS) RLL on CT, HONK, A Fib RVR, rhabdo (CPK 1700s) , AKI (creat 2.7), Lactic acidosis, demand NSTEMI . AT discharge time of dc to SNF had mild hypernatermia to 154. Re-admitted on 04/27/2014 - Na 162 and stroke L MCA with UTI. Na slowly corrected and then Na up due to NPO for TEE. Developed new fever since 05/03/14 and Na was again up to 166, BP 73/d, and worsening enceph and obutndation. PCCM taking over primary 04/27/2014  SIGNIFICANT EVENTS: 04/27/2014 - admit 05/03/14 - ICU transfer 05/04/14 - PCCM primary. Intubation  VITAL SIGNS: Temp:  [97 F (36.1 C)-98.9 F (37.2 C)] 97 F (36.1 C) (01/11 0800) Pulse Rate:  [31-87] 73 (01/11 1100) Resp:  [7-30] 19 (01/11 1100) BP: (76-156)/(35-116) 139/116 mmHg (01/11 1100) SpO2:  [91 %-100 %] 98 % (01/11 1100) FiO2 (%):  [30 %-40 %] 30 % (01/11 1100) Weight:  [112 kg (246 lb 14.6 oz)] 112 kg (246 lb 14.6 oz) (01/11 0500)   HEMODYNAMICS:   VENTILATOR SETTINGS: Vent Mode:  [-] CPAP;PSV FiO2 (%):  [30 %-40 %] 30 % Set Rate:  [16 bmp] 16 bmp Vt Set:  [440 mL] 440 mL PEEP:  [5 cmH20-8 cmH20] 5 cmH20 Pressure Support:  [5 cmH20] 5 cmH20 Plateau Pressure:  [12 cmH20-18 cmH20] 18 cmH20 INTAKE / OUTPUT:  Intake/Output Summary (Last 24 hours) at 05/08/14 1112 Last data filed at 05/08/14 1100  Gross per 24 hour  Intake 4576.25 ml  Output   2625 ml  Net 1951.25 ml   PHYSICAL EXAMINATION: General:  Obese, critically ill looking, on full vent support  Neuro:  Does not f/c agitated at times. Localizes  HEENT:  Orally intubated Cardiovascular:   Reg irreg  Lungs:scattered rhonchi Abdomen:  Obesest, Soft Musculoskeletal:  Scattered skin bruises, RT Sided PICC + GU - dirty urine in fole Skin:  Intact anteriorly  LABS: PULMONARY  Recent Labs Lab 05/03/14 0510 05/04/14 1628 05/07/14 0357  PHART 7.441 7.405 7.393  PCO2ART 43.4 39.6 35.2  PO2ART 58.0* 294.0* 136.0*  HCO3 29.1* 24.8* 21.4  TCO2 30.4 26 22   O2SAT 88.6 100.0 99.0   CBC  Recent Labs Lab 05/06/14 0550 05/07/14 0530 05/08/14 0540  HGB 8.7* 8.1* 8.1*  HCT 28.5* 25.4* 25.4*  WBC 16.8* 9.8 12.0*  PLT 185 188 292   COAGULATION  Recent Labs Lab 05/05/14 0500  INR 1.34    CARDIAC  Recent Labs Lab 05/04/14 1410 05/04/14 1919 05/05/14 0500  TROPONINI 0.10* 0.10* 0.11*   No results for input(s): PROBNP in the last 168 hours.   CHEMISTRY  Recent Labs Lab 05/04/14 1919 05/05/14 0500 05/06/14 0550 05/07/14 0530 05/08/14 0540  NA 156* 155* 150* 151* 147*  K 3.9 3.5 3.1* 3.0* 2.5*  CL 123* 124* 120* 122* 121*  CO2 25 27 24 23 20   GLUCOSE 348* 126* 120* 222* 153*  BUN 38* 43* 50* 51* 58*  CREATININE 2.43* 2.40* 2.37* 2.13* 1.82*  CALCIUM 7.7* 7.8* 8.0* 8.2* 8.3*  MG  --  1.8 2.0 2.0 1.9  PHOS  --  2.2* 3.5 3.7 3.8  Estimated Creatinine Clearance: 35.7 mL/min (by C-G formula based on Cr of 1.82).  LIVER  Recent Labs Lab 05/04/14 0345 05/05/14 0500 05/07/14 0530  AST 128* 46* 57*  ALT 38* 25 24  ALKPHOS 635* 487* 545*  BILITOT 0.6 0.6 0.3  PROT 5.7* 5.9* 5.6*  ALBUMIN 1.8* 1.6* 1.5*  INR  --  1.34  --    INFECTIOUS  Recent Labs Lab 05/04/14 1410 05/05/14 0500 05/06/14 0550  LATICACIDVEN 2.3*  --   --   PROCALCITON 1.24 3.74 3.31   ENDOCRINE CBG (last 3)   Recent Labs  05/07/14 2010 05/07/14 2335 05/08/14 0335  GLUCAP 251* 228* 136*   IMAGING x48h Dg Chest Port 1 View  05/07/2014   CLINICAL DATA:  Aspiration pneumonia  EXAM: PORTABLE CHEST - 1 VIEW  COMPARISON:  05/06/2014  FINDINGS: Patient is rotated.   Left lung base is obscured, but this may reflect overlying soft tissues. Lungs are otherwise clear. No pleural effusion or pneumothorax.  Endotracheal tube terminates 6 cm above the carina.  Right arm PICC terminates in the mid SVC. Enteric tube courses into the stomach.  IMPRESSION: Endotracheal tube terminates 6 cm above the carina.   Electronically Signed   By: Charline Bills M.D.   On: 05/07/2014 07:35    ASSESSMENT / PLAN:  PULMONARY OETT 05/04/2014  A:  Acute resp failure to encephalopathy and possible HCAP vs aspiration  Stable, tolerating PSV, but MS not ready for extubation. CXR improved  P:   PS trials as able, no extubation given mental status. VAP protocol See ID  F/u pcxr and abg in am   CARDIOVASCULAR CVLRt UE picc A:  Circulatory shock - ? Due to dehydration fron high Na v sepsis v both > pressors off P:  Cont stress dose steroids Begin active diureses today D/c pressors   RENAL  A:   Hypernatremia, improving Acute Renal Failure-->slightly improved  Hypokalemia  P:   Adjust free water  Replace electrolytes as indicated D5W at 65 ml/hr. MAP goal > 65 F/u chem in am   GASTROINTESTINAL A:   Mental status precludes diet P:   Tube feeds per nutrition  PPI  HEMATOLOGIC A:   Anemia of critical illness P:  PRBC for hgb < 7gm% Lovenox for dvt proph  INFECTIOUS 12.31.16 - MRSA PCR - neg 1./1 - blodo culture - negative 04/30/14 - urine culture - e coli - sensit to ceftriaxone 05/03/14 - blood negative 05/03/14 - MRSA PCR - negative 1/7 c diff: neg  A:   UTI septic shock +/- HCAP P:   vanc 1/9>>> Cefepime 1/9>>> Will complete 7 days  ENDOCRINE A:   DM Relative adrenal insuff  P:   ICU hyperglycemia protocol Cont stress dose steroids  NEUROLOGIC A:   Comatose due to hypernatremia. Subacute CVA  P:   Fent prn for sedation RASS goal 0  Neuro helping MRI per neuro; will get this after off pressors   Need TEE has been ordered. Cards  following. Likely Monday   FAMILY  - Updates:  No family bedside, will need to clarify code status prior to extubation.  - Inter-disciplinary family meet or Palliative Care meeting due by:  05/11/14  The patient is critically ill with multiple organ systems failure and requires high complexity decision making for assessment and support, frequent evaluation and titration of therapies, application of advanced monitoring technologies and extensive interpretation of multiple databases.   Critical Care Time devoted to patient care services described  in this note is  35  Minutes. This time reflects time of care of this signee Dr Jennet Maduro. This critical care time does not reflect procedure time, or teaching time or supervisory time of PA/NP/Med student/Med Resident etc but could involve care discussion time.  Rush Farmer, M.D. [facility] Pulmonary/Critical Care Medicine. Pager: [phone]. After hours pager: [phone].

## 2014-05-09 ENCOUNTER — Inpatient Hospital Stay (HOSPITAL_COMMUNITY): Payer: Medicare Other

## 2014-05-09 LAB — GLUCOSE, CAPILLARY
Glucose-Capillary: 177 mg/dL — ABNORMAL HIGH (ref 70–99)
Glucose-Capillary: 191 mg/dL — ABNORMAL HIGH (ref 70–99)
Glucose-Capillary: 207 mg/dL — ABNORMAL HIGH (ref 70–99)
Glucose-Capillary: 228 mg/dL — ABNORMAL HIGH (ref 70–99)
Glucose-Capillary: 249 mg/dL — ABNORMAL HIGH (ref 70–99)
Glucose-Capillary: 263 mg/dL — ABNORMAL HIGH (ref 70–99)

## 2014-05-09 LAB — BLOOD GAS, ARTERIAL
ACID-BASE DEFICIT: 2.7 mmol/L — AB (ref 0.0–2.0)
BICARBONATE: 20.9 meq/L (ref 20.0–24.0)
Drawn by: 41977
FIO2: 0.3 %
MECHVT: 440 mL
O2 Saturation: 96.9 %
PEEP: 5 cmH2O
PO2 ART: 91.9 mmHg (ref 80.0–100.0)
Patient temperature: 98.6
RATE: 16 resp/min
TCO2: 21.9 mmol/L (ref 0–100)
pCO2 arterial: 31.7 mmHg — ABNORMAL LOW (ref 35.0–45.0)
pH, Arterial: 7.434 (ref 7.350–7.450)

## 2014-05-09 LAB — CBC WITH DIFFERENTIAL/PLATELET
Basophils Absolute: 0 10*3/uL (ref 0.0–0.1)
Basophils Relative: 0 % (ref 0–1)
Eosinophils Absolute: 0 10*3/uL (ref 0.0–0.7)
Eosinophils Relative: 0 % (ref 0–5)
HEMATOCRIT: 26.3 % — AB (ref 36.0–46.0)
HEMOGLOBIN: 8.7 g/dL — AB (ref 12.0–15.0)
LYMPHS PCT: 4 % — AB (ref 12–46)
Lymphs Abs: 0.6 10*3/uL — ABNORMAL LOW (ref 0.7–4.0)
MCH: 28.9 pg (ref 26.0–34.0)
MCHC: 33.1 g/dL (ref 30.0–36.0)
MCV: 87.4 fL (ref 78.0–100.0)
MONO ABS: 0.4 10*3/uL (ref 0.1–1.0)
MONOS PCT: 3 % (ref 3–12)
Neutro Abs: 13.5 10*3/uL — ABNORMAL HIGH (ref 1.7–7.7)
Neutrophils Relative %: 93 % — ABNORMAL HIGH (ref 43–77)
Platelets: 368 10*3/uL (ref 150–400)
RBC: 3.01 MIL/uL — ABNORMAL LOW (ref 3.87–5.11)
RDW: 15.1 % (ref 11.5–15.5)
WBC: 14.5 10*3/uL — ABNORMAL HIGH (ref 4.0–10.5)

## 2014-05-09 LAB — CULTURE, BLOOD (ROUTINE X 2)
CULTURE: NO GROWTH
CULTURE: NO GROWTH

## 2014-05-09 LAB — BASIC METABOLIC PANEL
ANION GAP: 7 (ref 5–15)
BUN: 63 mg/dL — AB (ref 6–23)
CHLORIDE: 115 meq/L — AB (ref 96–112)
CO2: 22 mmol/L (ref 19–32)
CREATININE: 1.77 mg/dL — AB (ref 0.50–1.10)
Calcium: 8.1 mg/dL — ABNORMAL LOW (ref 8.4–10.5)
GFR calc Af Amer: 33 mL/min — ABNORMAL LOW (ref >90)
GFR calc non Af Amer: 28 mL/min — ABNORMAL LOW (ref >90)
Glucose, Bld: 282 mg/dL — ABNORMAL HIGH (ref 70–99)
Potassium: 2 mmol/L — CL (ref 3.5–5.1)
Sodium: 144 mmol/L (ref 135–145)

## 2014-05-09 LAB — PHOSPHORUS: Phosphorus: 4 mg/dL (ref 2.3–4.6)

## 2014-05-09 LAB — MAGNESIUM: Magnesium: 1.5 mg/dL (ref 1.5–2.5)

## 2014-05-09 MED ORDER — POTASSIUM CHLORIDE 10 MEQ/50ML IV SOLN
INTRAVENOUS | Status: AC
  Administered 2014-05-09: 10 meq via INTRAVENOUS
  Filled 2014-05-09: qty 50

## 2014-05-09 MED ORDER — FUROSEMIDE 10 MG/ML IJ SOLN
40.0000 mg | Freq: Three times a day (TID) | INTRAMUSCULAR | Status: AC
  Administered 2014-05-09 (×2): 40 mg via INTRAVENOUS
  Filled 2014-05-09 (×2): qty 4

## 2014-05-09 MED ORDER — MAGNESIUM SULFATE 2 GM/50ML IV SOLN
2.0000 g | Freq: Once | INTRAVENOUS | Status: AC
  Administered 2014-05-09: 2 g via INTRAVENOUS
  Filled 2014-05-09: qty 50

## 2014-05-09 MED ORDER — ENOXAPARIN SODIUM 40 MG/0.4ML ~~LOC~~ SOLN
40.0000 mg | SUBCUTANEOUS | Status: DC
  Administered 2014-05-09 – 2014-05-16 (×8): 40 mg via SUBCUTANEOUS
  Filled 2014-05-09 (×10): qty 0.4

## 2014-05-09 MED ORDER — POTASSIUM CHLORIDE 10 MEQ/50ML IV SOLN
10.0000 meq | INTRAVENOUS | Status: AC
  Administered 2014-05-09 (×6): 10 meq via INTRAVENOUS
  Filled 2014-05-09 (×6): qty 50

## 2014-05-09 MED ORDER — POTASSIUM CHLORIDE 20 MEQ/15ML (10%) PO SOLN
40.0000 meq | Freq: Once | ORAL | Status: AC
  Administered 2014-05-09: 40 meq
  Filled 2014-05-09: qty 30

## 2014-05-09 MED ORDER — GADOBENATE DIMEGLUMINE 529 MG/ML IV SOLN: 10.0000 mL | Freq: Once | INTRAVENOUS | Status: AC | PRN

## 2014-05-09 MED ORDER — POTASSIUM CHLORIDE 10 MEQ/50ML IV SOLN
10.0000 meq | INTRAVENOUS | Status: AC
  Administered 2014-05-09 (×2): 10 meq via INTRAVENOUS
  Filled 2014-05-09: qty 50

## 2014-05-09 MED ORDER — FREE WATER
200.0000 mL | Status: DC
  Administered 2014-05-09 – 2014-05-12 (×23): 200 mL

## 2014-05-09 NOTE — Progress Notes (Addendum)
Inpatient Diabetes Program Recommendations  AACE/ADA: New Consensus Statement on Inpatient Glycemic Control (2013)  Target Ranges:  Prepandial:   less than 140 mg/dL      Peak postprandial:   less than 180 mg/dL (1-2 hours)      Critically ill patients:  140 - 180 mg/dL     Results for [name], Nimrat C (MRN [id number]) as of 05/09/2014 07:21  Ref. Range 05/07/2014 23:35 05/08/2014 03:35 05/08/2014 07:45 05/08/2014 11:58 05/08/2014 16:08 05/08/2014 19:38  Glucose-Capillary Latest Range: 70-99 mg/dL 409228 (H) 811136 (H) 914132 (H) 155 (H) 143 (H) 259 (H)    Results for [name], Malesha C (MRN [id number]) as of 05/09/2014 07:21  Ref. Range 05/08/2014 23:46 05/09/2014 03:57  Glucose-Capillary Latest Range: 70-99 mg/dL 086191 (H) 578228 (H)     Current Orders: Lantus 10 units QHS   **Patient having occasional hyperglycemia   **Getting trickle tube feeds at present  *Noted patient also receiving IV Solucortef 50 mg Q6 hours   MD- Please consider the following insulin adjustments while patient on IV steroids:   1. Add Lantus 15 units QHS (0.15 units/kg) 2. Decrease SSI to Moderate scale Q4 hours (currently ordered as Resistant scale)     Will follow Ambrose FinlandJeannine Johnston Eila Runyan RN, MSN, CDE Diabetes Coordinator Inpatient Diabetes Program Team Pager: [phone] (8a-10p)

## 2014-05-09 NOTE — Progress Notes (Signed)
PULMONARY / CRITICAL CARE MEDICINE   Name: [name] MRN: [id number] DOB: April 09, 1946    ADMISSION DATE:  04/27/2014 CONSULTATION DATE:  05/04/2014   REFERRING MD :  TRiad hospitalist  CHIEF COMPLAINT:  Acute encephalopathy, Sepsis, Circyulatory shock, Stroke, hypernatremia  HPI: Hx from Dr Roda Shutters of neuro 22 yof admitted initially 04/17/14 through 04/25/14  to [facility] due to Sepsis in setting of CAP (NOS) RLL on CT, HONK, A Fib RVR, rhabdo (CPK 1700s) , AKI (creat 2.7), Lactic acidosis, demand NSTEMI . AT discharge time of dc to SNF had mild hypernatermia to 154. Re-admitted on 04/27/2014 - Na 162 and stroke L MCA with UTI. Na slowly corrected and then Na up due to NPO for TEE. Developed new fever since 05/03/14 and Na was again up to 166, BP 73/d, and worsening enceph and obutndation. PCCM taking over primary 04/27/2014  SIGNIFICANT EVENTS: 04/27/2014 - admit 05/03/14 - ICU transfer 05/04/14 - PCCM primary. Intubation  VITAL SIGNS: Temp:  [97.4 F (36.3 C)-99.1 F (37.3 C)] 98.2 F (36.8 C) (01/12 0821) Pulse Rate:  [42-99] 58 (01/12 0900) Resp:  [15-27] 16 (01/12 0900) BP: (102-167)/(42-116) 105/58 mmHg (01/12 0900) SpO2:  [91 %-100 %] 100 % (01/12 0900) FiO2 (%):  [30 %] 30 % (01/12 0900) Weight:  [111.5 kg (245 lb 13 oz)] 111.5 kg (245 lb 13 oz) (01/12 0450)   HEMODYNAMICS:   VENTILATOR SETTINGS: Vent Mode:  [-] CPAP;PSV FiO2 (%):  [30 %] 30 % Set Rate:  [16 bmp] 16 bmp Vt Set:  [440 mL] 440 mL PEEP:  [5 cmH20] 5 cmH20 Pressure Support:  [5 cmH20] 5 cmH20 Plateau Pressure:  [15 cmH20-16 cmH20] 15 cmH20 INTAKE / OUTPUT:  Intake/Output Summary (Last 24 hours) at 05/09/14 0959 Last data filed at 05/09/14 0900  Gross per 24 hour  Intake   3665 ml  Output   6100 ml  Net  -2435 ml   PHYSICAL EXAMINATION: General:  Obese, critically ill looking, on pressure support  Neuro:  Localizes but does not follow command. HEENT:  Orally intubated Cardiovascular:  Reg irreg   Lungs:scattered rhonchi Abdomen:  Obesest, Soft Musculoskeletal:  Scattered skin bruises, RT Sided PICC + GU - dirty urine in fole Skin:  Intact anteriorly  LABS: PULMONARY  Recent Labs Lab 05/03/14 0510 05/04/14 1628 05/07/14 0357 05/09/14 0404  PHART 7.441 7.405 7.393 7.434  PCO2ART 43.4 39.6 35.2 31.7*  PO2ART 58.0* 294.0* 136.0* 91.9  HCO3 29.1* 24.8* 21.4 20.9  TCO2 30.4 26 22  21.9  O2SAT 88.6 100.0 99.0 96.9   CBC  Recent Labs Lab 05/07/14 0530 05/08/14 0540 05/09/14 0459  HGB 8.1* 8.1* 8.7*  HCT 25.4* 25.4* 26.3*  WBC 9.8 12.0* 14.5*  PLT 188 292 368   COAGULATION  Recent Labs Lab 05/05/14 0500  INR 1.34    CARDIAC  Recent Labs Lab 05/04/14 1410 05/04/14 1919 05/05/14 0500  TROPONINI 0.10* 0.10* 0.11*   No results for input(s): PROBNP in the last 168 hours.   CHEMISTRY  Recent Labs Lab 05/05/14 0500 05/06/14 0550 05/07/14 0530 05/08/14 0540 05/08/14 1200 05/09/14 0459  NA 155* 150* 151* 147* 146* 144  K 3.5 3.1* 3.0* 2.5* 3.4* 2.0*  CL 124* 120* 122* 121* 120* 115*  CO2 18* 22  GLUCOSE 126* 120* 222* 153* 170* 282*  BUN 43* 50* 51* 58* 56* 63*  CREATININE 2.40* 2.37* 2.13* 1.82* 1.70* 1.77*  CALCIUM 7.8* 8.0* 8.2* 8.3* 8.4 8.1*  MG 1.8  2.0 2.0 1.9  --  1.5  PHOS 2.2* 3.5 3.7 3.8  --  4.0   Estimated Creatinine Clearance: 36.7 mL/min (by C-G formula based on Cr of 1.77).  LIVER  Recent Labs Lab 05/04/14 0345 05/05/14 0500 05/07/14 0530  AST 128* 46* 57*  ALT 38* 25 24  ALKPHOS 635* 487* 545*  BILITOT 0.6 0.6 0.3  PROT 5.7* 5.9* 5.6*  ALBUMIN 1.8* 1.6* 1.5*  INR  --  1.34  --    INFECTIOUS  Recent Labs Lab 05/04/14 1410 05/05/14 0500 05/06/14 0550  LATICACIDVEN 2.3*  --   --   PROCALCITON 1.24 3.74 3.31   ENDOCRINE CBG (last 3)   Recent Labs  05/08/14 2346 05/09/14 0357 05/09/14 0819  GLUCAP 191* 228* 207*   IMAGING x48h Dg Chest Port 1 View  05/09/2014   CLINICAL DATA:  Intubated  patient history of acute renal insufficiency, sepsis, and rhabdomyolysis  EXAM: PORTABLE CHEST - 1 VIEW  COMPARISON:  Portable chest x-ray of May 07, 2014  FINDINGS: The patient is markedly rotated on this study. The lungs are adequately inflated. The interstitial markings remain increased bilaterally. The retrocardiac region remains dense. The left hemidiaphragm remains partially obscured. The cardiac silhouette is mildly enlarged. The pulmonary vascularity is slightly less congested today.  The endotracheal tube tip projects 4.6 cm above the crotch of the carina. The enteric feeding tube tip projects below the inferior margin of the image. The right-sided PICC line tip projects over the proximal SVC.  IMPRESSION: There has not been significant interval change in the appearance of the chest since the study of 2 days ago. Slightly improvement in visualization of the left heart border is noted which may reflect some decreasing interstitial edema.   Electronically Signed   By: David  SwazilandJordan   On: 05/09/2014 07:50   Dg Abd Portable 1v  05/08/2014   CLINICAL DATA:  NG tube placement.  EXAM: PORTABLE ABDOMEN - 1 VIEW  COMPARISON:  CT 04/18/2014.  Spot fluoroscopic image 1 07/2014  FINDINGS: Feeding tube extends into the stomach. The tube is curved twice into the left lower quadrant. Favor tip to be within the proximal jejunum distal to ligament Treitz.  IMPRESSION: Feeding tube with tip favored to be within the jejunum distal to ligament Treitz.   Electronically Signed   By: Genevive BiStewart  Edmunds M.D.   On: 05/08/2014 13:12    ASSESSMENT / PLAN:  PULMONARY OETT 05/04/2014  A:  Acute resp failure to encephalopathy and possible HCAP vs aspiration  Stable, tolerating PSV, but MS not ready for extubation. CXR improved  P:   PS trials as able, no extubation given mental status. VAP protocol See ID  F/u pcxr and abg in am  Will need to discuss plan of care  CARDIOVASCULAR CVLRt UE picc A:  Circulatory shock  - ? Due to dehydration fron high Na v sepsis v both > pressors off P:  Cont stress dose steroids Lasix 40 mg IV q8 x2 doses. D/c pressors   RENAL  A:   Hypernatremia, improving Acute Renal Failure-->slightly improved  Hypokalemia  P:   Decrease free water to 200 ml q4. Replace electrolytes as indicated. D/C D5W. MAP goal > 65 mmHg. F/u chem in am.  GASTROINTESTINAL A:   Mental status precludes diet P:   Tube feeds per nutrition. PPI.  HEMATOLOGIC A:   Anemia of critical illness P:  PRBC for hgb < 7gm%. Lovenox for DVT proph.  INFECTIOUS 12.31.16 - MRSA  PCR - neg 1./1 - blodo culture - negative 04/30/14 - urine culture - e coli - sensit to ceftriaxone 05/03/14 - blood negative 05/03/14 - MRSA PCR - negative 1/7 c diff: neg  A:   UTI septic shock +/- HCAP P:   vanc 1/9>>> Cefepime 1/9>>> Will complete 7 days  ENDOCRINE A:   DM Relative adrenal insuff  P:   ICU hyperglycemia protocol Cont stress dose steroids  NEUROLOGIC A:   Comatose due to hypernatremia. Subacute CVA  P:   Fent prn for sedation RASS goal 0  Neuro helping Will order MRI now that patient is more hemodynamically stable. Per cards not a candidate for TEE.  FAMILY  - Updates:  No family bedside, will need to clarify code status prior to extubation, RN to arrange meeting with family in AM.  - Inter-disciplinary family meet or Palliative Care meeting due by:  05/11/14  The patient is critically ill with multiple organ systems failure and requires high complexity decision making for assessment and support, frequent evaluation and titration of therapies, application of advanced monitoring technologies and extensive interpretation of multiple databases.   Critical Care Time devoted to patient care services described in this note is  35  Minutes. This time reflects time of care of this signee Dr Koren Bound. This critical care time does not reflect procedure time, or teaching time or  supervisory time of PA/NP/Med student/Med Resident etc but could involve care discussion time.  Alyson Reedy, M.D. [facility] Pulmonary/Critical Care Medicine. Pager: [phone]. After hours pager: [phone].

## 2014-05-09 NOTE — Progress Notes (Signed)
Patient transported to MRI and back on ventilator without complication.

## 2014-05-09 NOTE — Progress Notes (Signed)
Pt has been sinus brady with occasional dips of heart rate into 35-45 range. E-link notified. Will continue to monitor.

## 2014-05-09 NOTE — Progress Notes (Signed)
UR completed.  Shemika Robbs, RN BSN [facility] CCM Trauma/Neuro ICU Case Manager [phone]  

## 2014-05-09 NOTE — Progress Notes (Addendum)
CRITICAL VALUE ALERT  Critical value received:  Potassium level 2.0  Date of notification:  05/08/13  Time of notification:  0550  Critical value read back:Yes.    Nurse who received alert:  Alphia Mohevon Adonijah Baena   MD notified (1st page): Deterding, Lanora ManisElizabeth  Time of first page:  05:53  MD notified (2nd page):  Time of second page:  Responding MD:  Shelba Flakeeterding, Elizabeth  Time MD responded:  05:54

## 2014-05-09 NOTE — Progress Notes (Signed)
eLink Physician-Brief Progress Note Patient Name: [name] DOB: 09/04/1945 MRN: [id number]   Date of Service  05/09/2014  HPI/Events of Note  Hypokalemia and low normal mag  eICU Interventions  Potassium and mag replaced     Intervention Category Major Interventions: Electrolyte abnormality - evaluation and management  DETERDING,ELIZABETH 05/09/2014, 5:54 AM

## 2014-05-10 ENCOUNTER — Inpatient Hospital Stay (HOSPITAL_COMMUNITY): Payer: Medicare Other

## 2014-05-10 LAB — BASIC METABOLIC PANEL
Anion gap: 9 (ref 5–15)
BUN: 68 mg/dL — ABNORMAL HIGH (ref 6–23)
CALCIUM: 8.3 mg/dL — AB (ref 8.4–10.5)
CHLORIDE: 110 meq/L (ref 96–112)
CO2: 24 mmol/L (ref 19–32)
Creatinine, Ser: 1.76 mg/dL — ABNORMAL HIGH (ref 0.50–1.10)
GFR calc Af Amer: 33 mL/min — ABNORMAL LOW (ref >90)
GFR calc non Af Amer: 28 mL/min — ABNORMAL LOW (ref >90)
Glucose, Bld: 228 mg/dL — ABNORMAL HIGH (ref 70–99)
Potassium: <2 mmol/L — CL (ref 3.5–5.1)
SODIUM: 143 mmol/L (ref 135–145)

## 2014-05-10 LAB — CBC
HCT: 25.8 % — ABNORMAL LOW (ref 36.0–46.0)
Hemoglobin: 8.6 g/dL — ABNORMAL LOW (ref 12.0–15.0)
MCH: 29.1 pg (ref 26.0–34.0)
MCHC: 33.3 g/dL (ref 30.0–36.0)
MCV: 87.2 fL (ref 78.0–100.0)
Platelets: 420 10*3/uL — ABNORMAL HIGH (ref 150–400)
RBC: 2.96 MIL/uL — ABNORMAL LOW (ref 3.87–5.11)
RDW: 15.4 % (ref 11.5–15.5)
WBC: 15.4 10*3/uL — ABNORMAL HIGH (ref 4.0–10.5)

## 2014-05-10 LAB — BLOOD GAS, ARTERIAL
Acid-base deficit: 2.6 mmol/L — ABNORMAL HIGH (ref 0.0–2.0)
BICARBONATE: 21.3 meq/L (ref 20.0–24.0)
Drawn by: 36277
FIO2: 0.3 %
MECHVT: 440 mL
O2 SAT: 98.2 %
PEEP/CPAP: 5 cmH2O
Patient temperature: 98.6
RATE: 16 resp/min
TCO2: 22.4 mmol/L (ref 0–100)
pCO2 arterial: 34.7 mmHg — ABNORMAL LOW (ref 35.0–45.0)
pH, Arterial: 7.406 (ref 7.350–7.450)
pO2, Arterial: 121 mmHg — ABNORMAL HIGH (ref 80.0–100.0)

## 2014-05-10 LAB — GLUCOSE, CAPILLARY
GLUCOSE-CAPILLARY: 263 mg/dL — AB (ref 70–99)
GLUCOSE-CAPILLARY: 263 mg/dL — AB (ref 70–99)
Glucose-Capillary: 233 mg/dL — ABNORMAL HIGH (ref 70–99)
Glucose-Capillary: 198 mg/dL — ABNORMAL HIGH (ref 70–99)
Glucose-Capillary: 180 mg/dL — ABNORMAL HIGH (ref 70–99)
Glucose-Capillary: 145 mg/dL — ABNORMAL HIGH (ref 70–99)
Glucose-Capillary: 195 mg/dL — ABNORMAL HIGH (ref 70–99)

## 2014-05-10 LAB — MAGNESIUM: Magnesium: 2.1 mg/dL (ref 1.5–2.5)

## 2014-05-10 LAB — PHOSPHORUS: Phosphorus: 3.4 mg/dL (ref 2.3–4.6)

## 2014-05-10 MED ORDER — HYDROCORTISONE NA SUCCINATE PF 100 MG IJ SOLR
25.0000 mg | Freq: Four times a day (QID) | INTRAMUSCULAR | Status: DC
  Administered 2014-05-10 – 2014-05-14 (×16): 25 mg via INTRAVENOUS
  Filled 2014-05-10 (×20): qty 0.5

## 2014-05-10 MED ORDER — POTASSIUM CHLORIDE 20 MEQ/15ML (10%) PO SOLN
ORAL | Status: AC
  Filled 2014-05-10: qty 15

## 2014-05-10 MED ORDER — POTASSIUM CHLORIDE 20 MEQ/15ML (10%) PO SOLN
60.0000 meq | Freq: Once | ORAL | Status: AC
  Administered 2014-05-10: 60 meq

## 2014-05-10 MED ORDER — POTASSIUM CHLORIDE 10 MEQ/50ML IV SOLN
10.0000 meq | INTRAVENOUS | Status: AC
  Administered 2014-05-10 (×6): 10 meq via INTRAVENOUS
  Filled 2014-05-10 (×5): qty 50

## 2014-05-10 MED ORDER — FENTANYL CITRATE 0.05 MG/ML IJ SOLN
25.0000 ug | INTRAMUSCULAR | Status: DC | PRN
  Administered 2014-05-10 – 2014-05-14 (×9): 50 ug via INTRAVENOUS
  Administered 2014-05-15 – 2014-05-16 (×2): 25 ug via INTRAVENOUS
  Administered 2014-05-16 – 2014-05-17 (×2): 50 ug via INTRAVENOUS
  Filled 2014-05-10 (×12): qty 2

## 2014-05-10 MED ORDER — FUROSEMIDE 10 MG/ML IJ SOLN
40.0000 mg | Freq: Three times a day (TID) | INTRAMUSCULAR | Status: AC
  Administered 2014-05-10 (×2): 40 mg via INTRAVENOUS
  Filled 2014-05-10 (×2): qty 4

## 2014-05-10 MED ORDER — PRO-STAT SUGAR FREE PO LIQD
60.0000 mL | Freq: Two times a day (BID) | ORAL | Status: DC
  Administered 2014-05-10 – 2014-05-17 (×9): 60 mL
  Filled 2014-05-10 (×15): qty 60

## 2014-05-10 MED ORDER — VITAL HIGH PROTEIN PO LIQD
1000.0000 mL | ORAL | Status: DC
  Filled 2014-05-10 (×2): qty 1000

## 2014-05-10 MED ORDER — VITAL HIGH PROTEIN PO LIQD
1000.0000 mL | ORAL | Status: DC
  Administered 2014-05-10 – 2014-05-15 (×7): 1000 mL
  Filled 2014-05-10 (×11): qty 1000

## 2014-05-10 MED ORDER — POTASSIUM CHLORIDE 20 MEQ/15ML (10%) PO SOLN
40.0000 meq | Freq: Three times a day (TID) | ORAL | Status: AC
  Administered 2014-05-10 (×2): 40 meq
  Filled 2014-05-10 (×2): qty 30

## 2014-05-10 NOTE — Progress Notes (Signed)
CRITICAL VALUE ALERT  Critical value received:  Potassium <2.0  Date of notification:  05/10/13  Time of notification:  0611        Critical value read back:Yes.    Nurse who received alert:  Herma ArdEllie Messer RN  MD notified (1st page):  Dr Kendrick Friesmcquaid   Time of first page:  0615  MD notified (2nd page):  Time of second page:  Responding MD:  Dr Kendrick Friesmcquaid    Time MD responded:  [phone]- order for 6 runs K and 60meq per tube

## 2014-05-10 NOTE — Progress Notes (Signed)
Spoke with Leonia Readeralph Karn, patient's husband, regarding patient's extubation tomorrow, 05/11/14. He stated that he would like to go forward with the trach and PEG should the patient not maintain her airway post extubation. He will be able to be at the patient's bedside tomorrow at 11 am.

## 2014-05-10 NOTE — Progress Notes (Signed)
Chaplain initiated follow up with pt and husband. Pt husband reported pt progress and decisions they may need to make after extubation. Chaplain will continue support. Page chaplain as needed.    05/10/14 1400  Clinical Encounter Type  Visited With Patient and family together  Visit Type Follow-up;Spiritual support  Spiritual Encounters  Spiritual Needs Emotional  Stress Factors  Family Stress Factors Health changes  [name], Mayer MaskerCourtney F, Chaplain 05/10/2014 2:06 PM

## 2014-05-10 NOTE — Progress Notes (Signed)
ANTIBIOTIC CONSULT NOTE - FOLLOW UP  Pharmacy Consult for vancomycin, cefepime Indication: HCAP  Allergies  Allergen Reactions  . Morphine And Related     Patient Measurements: Height: 5\' 4"  (162.6 cm) Weight: 242 lb 8.1 oz (110 kg) IBW/kg (Calculated) : 54.7 Vital Signs: Temp: 97 F (36.1 C) (01/13 0747) Temp Source: Axillary (01/13 0747) BP: 129/66 mmHg (01/13 0700) Pulse Rate: 70 (01/13 0803) Intake/Output from previous day: 01/12 0701 - 01/13 0700 In: 3435 [I.V.:265; ZO/XW:9604G/GT:2235; IV Piggyback:600] Out: 3425 [Urine:2350; Stool:1075] Intake/Output from this shift:    Labs:  Recent Labs  05/08/14 0540 05/08/14 1200 05/09/14 0459 05/10/14 0510  WBC 12.0*  --  14.5* 15.4*  HGB 8.1*  --  8.7* 8.6*  PLT 292  --  368 420*  CREATININE 1.82* 1.70* 1.77* 1.76*   Estimated Creatinine Clearance: 36.6 mL/min (by C-G formula based on Cr of 1.76). No results for input(s): VANCOTROUGH, VANCOPEAK, VANCORANDOM, GENTTROUGH, GENTPEAK, GENTRANDOM, TOBRATROUGH, TOBRAPEAK, TOBRARND, AMIKACINPEAK, AMIKACINTROU, AMIKACIN in the last 72 hours.   Microbiology: Recent Results (from the past 720 hour(s))  Culture, blood (routine x 2)     Status: None   Collection Time: 04/17/14  7:59 PM  Result Value Ref Range Status   Specimen Description BLOOD RIGHT ANTECUBITAL  Final   Special Requests BOTTLES DRAWN AEROBIC AND ANAEROBIC 5ML  Final   Culture  Setup Time   Final    04/18/2014 02:56 Performed at [facility]    Culture   Final    NO GROWTH 5 DAYS Performed at [facility]    Report Status 05/02/2014 FINAL  Final  Culture, blood (routine x 2)     Status: None   Collection Time: 04/17/14  8:33 PM  Result Value Ref Range Status   Specimen Description BLOOD R HAND  Final   Special Requests BOTTLES DRAWN AEROBIC AND ANAEROBIC 7ML  Final   Culture  Setup Time   Final    04/18/2014 02:58 Performed at [facility]    Culture   Final    NO GROWTH 5  DAYS Performed at [facility]    Report Status 05/02/2014 FINAL  Final  Urine culture     Status: None   Collection Time: 04/17/14  8:47 PM  Result Value Ref Range Status   Specimen Description URINE, CATHETERIZED  Final   Special Requests Normal  Final   Culture  Setup Time   Final    04/18/2014 04:23 Performed at [facility]    Colony Count NO GROWTH Performed at [facility]   Final   Culture NO GROWTH Performed at [facility]   Final   Report Status 04/19/2014 FINAL  Final  MRSA PCR Screening     Status: None   Collection Time: 04/18/14  3:55 AM  Result Value Ref Range Status   MRSA by PCR NEGATIVE NEGATIVE Final    Comment:        The GeneXpert MRSA Assay (FDA approved for NASAL specimens only), is one component of a comprehensive MRSA colonization surveillance program. It is not intended to diagnose MRSA infection nor to guide or monitor treatment for MRSA infections.   Clostridium Difficile by PCR     Status: None   Collection Time: 04/23/14  7:07 AM  Result Value Ref Range Status   C difficile by pcr NEGATIVE NEGATIVE Final    Comment: Performed at [facility]  MRSA PCR Screening     Status:  None   Collection Time: 04/27/14  7:04 PM  Result Value Ref Range Status   MRSA by PCR NEGATIVE NEGATIVE Final    Comment:        The GeneXpert MRSA Assay (FDA approved for NASAL specimens only), is one component of a comprehensive MRSA colonization surveillance program. It is not intended to diagnose MRSA infection nor to guide or monitor treatment for MRSA infections.   Culture, blood (routine x 2)     Status: None   Collection Time: 04/28/14  9:41 AM  Result Value Ref Range Status   Specimen Description BLOOD RIGHT HAND  Final   Special Requests BOTTLES DRAWN AEROBIC ONLY 2CC  Final   Culture   Final    NO GROWTH 5 DAYS Performed at [facility]    Report Status 05/04/2014 FINAL  Final  Culture, blood  (routine x 2)     Status: None   Collection Time: 04/28/14  9:50 AM  Result Value Ref Range Status   Specimen Description BLOOD LEFT HAND  Final   Special Requests BOTTLES DRAWN AEROBIC ONLY 1CC  Final   Culture   Final    NO GROWTH 5 DAYS Performed at [facility]    Report Status 05/04/2014 FINAL  Final  Culture, Urine     Status: None   Collection Time: 04/30/14 12:36 PM  Result Value Ref Range Status   Specimen Description URINE, CATHETERIZED  Final   Special Requests NONE  Final   Colony Count   Final    >=100,000 COLONIES/ML Performed at [facility]    Culture   Final    ESCHERICHIA COLI Performed at [facility]    Report Status 05/02/2014 FINAL  Final   Organism ID, Bacteria ESCHERICHIA COLI  Final      Susceptibility   Escherichia coli - MIC*    AMPICILLIN >=32 RESISTANT Resistant     CEFAZOLIN <=4 SENSITIVE Sensitive     CEFTRIAXONE <=1 SENSITIVE Sensitive     CIPROFLOXACIN >=4 RESISTANT Resistant     GENTAMICIN <=1 SENSITIVE Sensitive     LEVOFLOXACIN >=8 RESISTANT Resistant     NITROFURANTOIN <=16 SENSITIVE Sensitive     TOBRAMYCIN <=1 SENSITIVE Sensitive     TRIMETH/SULFA >=320 RESISTANT Resistant     PIP/TAZO <=4 SENSITIVE Sensitive     * ESCHERICHIA COLI  Culture, blood (routine x 2)     Status: None   Collection Time: 05/03/14  4:01 AM  Result Value Ref Range Status   Specimen Description BLOOD LEFT ARM  Final   Special Requests BOTTLES DRAWN AEROBIC ONLY 2CC  Final   Culture   Final    NO GROWTH 5 DAYS Note: Culture results may be compromised due to an inadequate volume of blood received in culture bottles. Performed at [facility]    Report Status 05/09/2014 FINAL  Final  Culture, blood (routine x 2)     Status: None   Collection Time: 05/03/14  4:09 AM  Result Value Ref Range Status   Specimen Description BLOOD LEFT FOREARM  Final   Special Requests BOTTLES DRAWN AEROBIC ONLY 10CC  Final   Culture   Final     NO GROWTH 5 DAYS Note: Culture results may be compromised due to an excessive volume of blood received in culture bottles. Performed at [facility]    Report Status 05/09/2014 FINAL  Final  MRSA PCR Screening     Status: None   Collection  Time: 05/03/14  7:19 AM  Result Value Ref Range Status   MRSA by PCR NEGATIVE NEGATIVE Final    Comment:        The GeneXpert MRSA Assay (FDA approved for NASAL specimens only), is one component of a comprehensive MRSA colonization surveillance program. It is not intended to diagnose MRSA infection nor to guide or monitor treatment for MRSA infections.   Clostridium Difficile by PCR     Status: None   Collection Time: 05/04/14  3:41 PM  Result Value Ref Range Status   C difficile by pcr NEGATIVE NEGATIVE Final    Anti-infectives    Start     Dose/Rate Route Frequency Ordered Stop   05/08/14 1000  vancomycin (VANCOCIN) 1,250 mg in sodium chloride 0.9 % 250 mL IVPB     1,250 mg166.7 mL/hr over 90 Minutes Intravenous Every 24 hours 05/07/14 1204     05/05/14 1200  ceFEPIme (MAXIPIME) 1 g in dextrose 5 % 50 mL IVPB     1 g100 mL/hr over 30 Minutes Intravenous Every 24 hours 05/05/14 1039     05/05/14 1045  vancomycin (VANCOCIN) 1,500 mg in sodium chloride 0.9 % 500 mL IVPB  Status:  Discontinued     1,500 mg250 mL/hr over 120 Minutes Intravenous Every 48 hours 05/03/14 1039 05/07/14 1204   05/03/14 2200  piperacillin-tazobactam (ZOSYN) IVPB 3.375 g  Status:  Discontinued     3.375 g12.5 mL/hr over 240 Minutes Intravenous 3 times per day 05/03/14 1039 05/05/14 1014   05/03/14 1045  piperacillin-tazobactam (ZOSYN) IVPB 3.375 g     3.375 g100 mL/hr over 30 Minutes Intravenous  Once 05/03/14 1039 05/03/14 1213   05/03/14 1045  vancomycin (VANCOCIN) 2,000 mg in sodium chloride 0.9 % 500 mL IVPB     2,000 mg250 mL/hr over 120 Minutes Intravenous  Once 05/03/14 1039 05/03/14 1350   05/02/14 1015  cefTRIAXone (ROCEPHIN) 2 g in dextrose 5 % 50 mL  IVPB - Premix  Status:  Discontinued     2 g100 mL/hr over 30 Minutes Intravenous Every 24 hours 05/02/14 1006 05/03/14 1023      Assessment: 69 year old female continues on broad spectrum antibiotics. Today is D#8 of vancomycin and D#6 of cefepime. Pt is afebrile and WBC is 15.4. Scr has improved slightly to 1.76.    Vanc 1/6 >> Zosyn 1/6 >>1/8 Cefepime 1/8>>  1/7 Cdiff - NEG 1/6 Blood - NEG 1/3 Urine - Ecoli  1/1 Blood - NEG  Goal of Therapy:  Vancomycin trough level 15-20 mcg/ml  Plan:  1. Continue vancomycin  IV Q24H 2. Continue cefepime 1gm IV Q24H 3. F/u renal fxn, C&S, clinical status and trough at SS 4. MD - Please clarify planned LOT  Lysle Pearl, PharmD, BCPS Pager # [phone] 05/10/2014 8:25 AM

## 2014-05-10 NOTE — Progress Notes (Signed)
PULMONARY / CRITICAL CARE MEDICINE   Name: [name] MRN: [id number] DOB: 10/14/1945    ADMISSION DATE:  04/27/2014 CONSULTATION DATE:  05/04/2014   REFERRING MD :  TRiad hospitalist  CHIEF COMPLAINT:  Acute encephalopathy, Sepsis, Circyulatory shock, Stroke, hypernatremia  HPI: Hx from Dr Roda Shutters of neuro 12 yof admitted initially 04/17/14 through 04/25/14  to [facility] due to Sepsis in setting of CAP (NOS) RLL on CT, HONK, A Fib RVR, rhabdo (CPK 1700s) , AKI (creat 2.7), Lactic acidosis, demand NSTEMI . AT discharge time of dc to SNF had mild hypernatermia to 154. Re-admitted on 04/27/2014 - Na 162 and stroke L MCA with UTI. Na slowly corrected and then Na up due to NPO for TEE. Developed new fever since 05/03/14 and Na was again up to 166, BP 73/d, and worsening enceph and obutndation. PCCM taking over primary 04/27/2014  SIGNIFICANT EVENTS: 04/27/2014 - admit 05/03/14 - ICU transfer 05/04/14 - PCCM primary. Intubation  VITAL SIGNS: Temp:  [97 F (36.1 C)-98.8 F (37.1 C)] 97 F (36.1 C) (01/13 0747) Pulse Rate:  [44-84] 70 (01/13 0803) Resp:  [10-23] 19 (01/13 0803) BP: (74-147)/(38-84) 129/66 mmHg (01/13 0700) SpO2:  [98 %-100 %] 100 % (01/13 0803) FiO2 (%):  [30 %] 30 % (01/13 0803) Weight:  [110 kg (242 lb 8.1 oz)] 110 kg (242 lb 8.1 oz) (01/13 0441)   HEMODYNAMICS:   VENTILATOR SETTINGS: Vent Mode:  [-] PSV;CPAP FiO2 (%):  [30 %] 30 % Set Rate:  [16 bmp] 16 bmp Vt Set:  [440 mL] 440 mL PEEP:  [5 cmH20] 5 cmH20 Pressure Support:  [5 cmH20] 5 cmH20 Plateau Pressure:  [9 cmH20-15 cmH20] 9 cmH20 INTAKE / OUTPUT:  Intake/Output Summary (Last 24 hours) at 05/10/14 1018 Last data filed at 05/10/14 0902  Gross per 24 hour  Intake   2535 ml  Output   3500 ml  Net   -965 ml   PHYSICAL EXAMINATION: General:  Obese, critically ill looking, on pressure support  Neuro:  Localizes and follows commands on all 4 ext but very delayed.Marland Kitchen HEENT:  Orally intubated, PERRL, EOM-I and  +BS. Cardiovascular:  Reg irreg  Lungs:scattered rhonchi Abdomen:  Obesest, Soft Musculoskeletal:  Scattered skin bruises, RT Sided PICC + GU - dirty urine in fole Skin:  Intact anteriorly  LABS: PULMONARY  Recent Labs Lab 05/04/14 1628 05/07/14 0357 05/09/14 0404 05/10/14 0354  PHART 7.405 7.393 7.434 7.406  PCO2ART 39.6 35.2 31.7* 34.7*  PO2ART 294.0* 136.0* 91.9 121.0*  HCO3 24.8* 21.4 20.9 21.3  TCO2 26 22 21.9 22.4  O2SAT 100.0 99.0 96.9 98.2   CBC  Recent Labs Lab 05/08/14 0540 05/09/14 0459 05/10/14 0510  HGB 8.1* 8.7* 8.6*  HCT 25.4* 26.3* 25.8*  WBC 12.0* 14.5* 15.4*  PLT 292 368 420*   COAGULATION  Recent Labs Lab 05/05/14 0500  INR 1.34   CARDIAC  Recent Labs Lab 05/04/14 1410 05/04/14 1919 05/05/14 0500  TROPONINI 0.10* 0.10* 0.11*   No results for input(s): PROBNP in the last 168 hours.  CHEMISTRY  Recent Labs Lab 05/06/14 0550 05/07/14 0530 05/08/14 0540 05/08/14 1200 05/09/14 0459 05/10/14 0510  NA 150* 151* 147* 146* 144 143  K 3.1* 3.0* 2.5* 3.4* 2.0* <2.0*  CL 120* 122* 121* 120* 115* 110  CO2 18* 22 24  GLUCOSE 120* 222* 153* 170* 282* 228*  BUN 50* 51* 58* 56* 63* 68*  CREATININE 2.37* 2.13* 1.82* 1.70* 1.77* 1.76*  CALCIUM 8.0*  8.2* 8.3* 8.4 8.1* 8.3*  MG 2.0 2.0 1.9  --  1.5 2.1  PHOS 3.5 3.7 3.8  --  4.0 3.4   Estimated Creatinine Clearance: 36.6 mL/min (by C-G formula based on Cr of 1.76).  LIVER  Recent Labs Lab 05/04/14 0345 05/05/14 0500 05/07/14 0530  AST 128* 46* 57*  ALT 38* 25 24  ALKPHOS 635* 487* 545*  BILITOT 0.6 0.6 0.3  PROT 5.7* 5.9* 5.6*  ALBUMIN 1.8* 1.6* 1.5*  INR  --  1.34  --    INFECTIOUS  Recent Labs Lab 05/04/14 1410 05/05/14 0500 05/06/14 0550  LATICACIDVEN 2.3*  --   --   PROCALCITON 1.24 3.74 3.31   ENDOCRINE CBG (last 3)   Recent Labs  05/09/14 2352 05/10/14 0341 05/10/14 0746  GLUCAP 233* 198* 180*   IMAGING x48h Dg Chest Port 1 View  05/10/2014    CLINICAL DATA:  Recent stroke, followup  EXAM: PORTABLE CHEST - 1 VIEW  COMPARISON:  Portable chest x-ray of 05/09/2014  FINDINGS: The patient is very rotated on the current portable films. No focal infiltrate or effusion is seen with slight under aeration of the lung bases. Mild cardiomegaly is stable. The right central venous line tip overlies the mid SVC and a feeding tube remains.  IMPRESSION: No significant change in aeration on this rotated film. Stable cardiomegaly.   Electronically Signed   By: Dwyane DeePaul  Barry M.D.   On: 05/10/2014 07:59   Dg Chest Port 1 View  05/09/2014   CLINICAL DATA:  Intubated patient history of acute renal insufficiency, sepsis, and rhabdomyolysis  EXAM: PORTABLE CHEST - 1 VIEW  COMPARISON:  Portable chest x-ray of May 07, 2014  FINDINGS: The patient is markedly rotated on this study. The lungs are adequately inflated. The interstitial markings remain increased bilaterally. The retrocardiac region remains dense. The left hemidiaphragm remains partially obscured. The cardiac silhouette is mildly enlarged. The pulmonary vascularity is slightly less congested today.  The endotracheal tube tip projects 4.6 cm above the crotch of the carina. The enteric feeding tube tip projects below the inferior margin of the image. The right-sided PICC line tip projects over the proximal SVC.  IMPRESSION: There has not been significant interval change in the appearance of the chest since the study of 2 days ago. Slightly improvement in visualization of the left heart border is noted which may reflect some decreasing interstitial edema.   Electronically Signed   By: David  SwazilandJordan   On: 05/09/2014 07:50   Dg Abd Portable 1v  05/08/2014   CLINICAL DATA:  NG tube placement.  EXAM: PORTABLE ABDOMEN - 1 VIEW  COMPARISON:  CT 04/18/2014.  Spot fluoroscopic image 1 07/2014  FINDINGS: Feeding tube extends into the stomach. The tube is curved twice into the left lower quadrant. Favor tip to be within the  proximal jejunum distal to ligament Treitz.  IMPRESSION: Feeding tube with tip favored to be within the jejunum distal to ligament Treitz.   Electronically Signed   By: Genevive BiStewart  Edmunds M.D.   On: 05/08/2014 13:12    ASSESSMENT / PLAN:  PULMONARY OETT 05/04/2014>>> A:  Acute resp failure to encephalopathy and possible HCAP vs aspiration  Stable, tolerating PSV, but MS not ready for extubation. CXR improved  P:   PS trials as able, no extubation given mental status. VAP protocol See ID  F/u pcxr and abg in am  Will need to discuss plan of care with family at 1 PM today whether or  not they want trach/peg. MRI pending, will not extubate til that is done.  CARDIOVASCULAR CVLRt UE picc A:  Circulatory shock - ? Due to dehydration fron high Na v sepsis v both > pressors off P:  Decrease stress dose steroids to 25 mg IV q6 hours. Lasix 40 mg IV q8 x2 doses. D/c pressors   RENAL  A:   Hypernatremia, improving Acute Renal Failure-->slightly improved  Hypokalemia  P:   Decreased free water to 200 ml q4. Replace electrolytes as indicated. D/Ced D5W. MAP goal > 65 mmHg. F/u chem in am.  GASTROINTESTINAL A:   Mental status precludes diet P:   Tube feeds per nutrition. PPI.  HEMATOLOGIC A:   Anemia of critical illness P:  PRBC for hgb < 7gm%. Lovenox for DVT proph.  INFECTIOUS 12.31.16 - MRSA PCR - neg 1./1 - blodo culture - negative 04/30/14 - urine culture - e coli - sensit to ceftriaxone 05/03/14 - blood negative 05/03/14 - MRSA PCR - negative 1/7 c diff: neg  A:   UTI septic shock +/- HCAP P:   vanc 1/9>>> Cefepime 1/9>>> Will complete 7 days to be stopped after 1/15 dose.  ENDOCRINE A:   DM Relative adrenal insuff  P:   ICU hyperglycemia protocol. Cont stress dose steroids.  NEUROLOGIC A:   Comatose due to hypernatremia. Subacute CVA  P:   Fent prn for sedation. RASS goal 0. Neuro helping. MRI pending. Per cards not a candidate for TEE.  If  multiple stroke sights however then will recall for TEE.  FAMILY  - Updates:  Family to come in today at 1 PM to discuss plan of care.  - Inter-disciplinary family meet or Palliative Care meeting due by:  05/11/14  The patient is critically ill with multiple organ systems failure and requires high complexity decision making for assessment and support, frequent evaluation and titration of therapies, application of advanced monitoring technologies and extensive interpretation of multiple databases.   Critical Care Time devoted to patient care services described in this note is  35  Minutes. This time reflects time of care of this signee Dr Koren Bound. This critical care time does not reflect procedure time, or teaching time or supervisory time of PA/NP/Med student/Med Resident etc but could involve care discussion time.  Alyson Reedy, M.D. [facility] Pulmonary/Critical Care Medicine. Pager: [phone]. After hours pager: [phone].

## 2014-05-10 NOTE — Progress Notes (Signed)
NUTRITION FOLLOW UP  Intervention:   Increase Vital High Protein by 10 ml every 4 hours to goal rate of 40 ml/hr.   60 ml Prostat BID.    Tube feeding regimen provides 1360 kcal (67% of needs), 144 grams of protein, and 802 ml of H2O.   Nutrition Dx:   Inadequate oral intake related to AMS as evidenced by NPO status. Ongoing.  Goal:   Intake to meet >90% of estimated nutrition needs; being met  Monitor:   TF tolerance/adequacy, weight trend, labs  Assessment:   69 y.o. female with a history of atrial fibrillation and recent hospitalization for sepsis, pneumonia rhabdomyolysis as well as hypernatremia and acute kidney injury, admitted with bilateral MCA embolic infarcts.  Pt failed swallow evaluation and had a nasoenteric feeding tube placed (tip in distal duodenum near ligament of Treitz)  Pt is receiving 200 ml of free water every 3 hours, to provide 1600 ml daily  Patient is currently intubated on ventilator support MV: 12.7 L/min Temp (24hrs), Avg:98.2 F (36.8 C), Min:97 F (36.1 C), Max:98.8 F (37.1 C)  Low Potassium being supplemented  Family meeting planned for 1/14.   Height: Ht Readings from Last 1 Encounters:  05/03/14 5' 4"  (1.626 m)    Weight Status:   Wt Readings from Last 1 Encounters:  05/10/14 242 lb 8.1 oz (110 kg)  04/29/14 238 lb 04/20/14 252 lb  Re-estimated needs:  Kcal: 2027 Protein: >/= 136 grams Fluid: > 2 L/day  Skin: stage 2 pressure ulcer to buttocks  Diet Order: Diet NPO time specified   Intake/Output Summary (Last 24 hours) at 05/10/14 1353 Last data filed at 05/10/14 1345  Gross per 24 hour  Intake   2825 ml  Output   3825 ml  Net  -1000 ml    Last BM: 1/12 via rectal tube (inserted 1/8) 1/13: 1350 ml 1/12: 800 ml    Labs:   Recent Labs Lab 05/08/14 0540 05/08/14 1200 05/09/14 0459 05/10/14 0510  NA 147* 146* 144 143  K 2.5* 3.4* 2.0* <2.0*  CL 121* 120* 115* 110  CO2 20 18* 22 24  BUN 58* 56* 63* 68*   CREATININE 1.82* 1.70* 1.77* 1.76*  CALCIUM 8.3* 8.4 8.1* 8.3*  MG 1.9  --  1.5 2.1  PHOS 3.8  --  4.0 3.4  GLUCOSE 153* 170* 282* 228*    CBG (last 3)   Recent Labs  05/10/14 0341 05/10/14 0746 05/10/14 1204  GLUCAP 198* 180* 263*    Scheduled Meds: . antiseptic oral rinse  7 mL Mouth Rinse QID  . aspirin  325 mg Oral Daily   Or  . aspirin  300 mg Rectal Daily  . ceFEPime (MAXIPIME) IV  1 g Intravenous Q24H  . chlorhexidine  15 mL Mouth Rinse BID  . enoxaparin (LOVENOX) injection  40 mg Subcutaneous Q24H  . feeding supplement (PRO-STAT SUGAR FREE 64)  60 mL Per Tube QID  . free water  200 mL Per Tube Q3H  . furosemide  40 mg Intravenous Q8H  . hydrocortisone sod succinate (SOLU-CORTEF) inj  25 mg Intravenous Q6H  . Influenza vac split quadrivalent PF  0.5 mL Intramuscular Tomorrow-1000  . insulin aspart  0-20 Units Subcutaneous 6 times per day  . pantoprazole sodium  40 mg Per Tube Q24H  . potassium chloride  40 mEq Per Tube TID  . terbinafine   Topical BID  . vancomycin  1,250 mg Intravenous Q24H    Continuous Infusions: .  feeding supplement (VITAL HIGH PROTEIN) 1,000 mL (05/10/14 1335)  . norepinephrine (LEVOPHED) Adult infusion Stopped (05/06/14 2218)    [place], [place], CNSC [phone] Pager [phone] After Hours Pager

## 2014-05-10 NOTE — Progress Notes (Signed)
Inpatient Diabetes Program Recommendations  AACE/ADA: New Consensus Statement on Inpatient Glycemic Control (2013)  Target Ranges:  Prepandial:   less than 140 mg/dL      Peak postprandial:   less than 180 mg/dL (1-2 hours)      Critically ill patients:  140 - 180 mg/dL    Results for Natalie RenshawHAVIS, Maleena C (MRN [id number]) as of 05/10/2014 08:04  Ref. Range 05/08/2014 23:46 05/09/2014 03:57 05/09/2014 08:19 05/09/2014 12:16 05/09/2014 16:04 05/09/2014 20:04  Glucose-Capillary Latest Range: 70-99 mg/dL 010191 (H) 272228 (H) 536207 (H) 177 (H) 249 (H) 263 (H)    Results for Natalie RenshawHAVIS, [name] (MRN [id number]) as of 05/10/2014 08:04  Ref. Range 05/09/2014 23:52 05/10/2014 03:41  Glucose-Capillary Latest Range: 70-99 mg/dL 595233 (H) 638198 (H)     Current Orders: Novolog Resistant SSI Q4 hours   **Patient having hyperglycemia   **Getting trickle tube feeds at present  *Noted patient also receiving IV Solucortef 50 mg Q6 hours   MD- Please consider the following insulin adjustments while patient on IV steroids:   1. Add Lantus 15 units QHS (0.15 units/kg) 2. Decrease SSI to Moderate scale Q4 hours (currently ordered as Resistant scale)    Will follow Ambrose FinlandJeannine Johnston Jaeven Wanzer RN, MSN, CDE Diabetes Coordinator Inpatient Diabetes Program Team Pager: [phone] (8a-10p)

## 2014-05-11 ENCOUNTER — Encounter (HOSPITAL_COMMUNITY): Payer: Self-pay | Admitting: Cardiovascular Disease

## 2014-05-11 ENCOUNTER — Inpatient Hospital Stay (HOSPITAL_COMMUNITY): Payer: Medicare Other

## 2014-05-11 LAB — BLOOD GAS, ARTERIAL
Acid-base deficit: 4.8 mmol/L — ABNORMAL HIGH (ref 0.0–2.0)
Bicarbonate: 19.1 mEq/L — ABNORMAL LOW (ref 20.0–24.0)
Drawn by: 252031
FIO2: 0.3 %
LHR: 16 {breaths}/min
O2 Saturation: 98.5 %
PEEP/CPAP: 5 cmH2O
PH ART: 7.399 (ref 7.350–7.450)
PO2 ART: 118 mmHg — AB (ref 80.0–100.0)
Patient temperature: 98.6
TCO2: 20.1 mmol/L (ref 0–100)
VT: 440 mL
pCO2 arterial: 31.6 mmHg — ABNORMAL LOW (ref 35.0–45.0)

## 2014-05-11 LAB — BASIC METABOLIC PANEL
ANION GAP: 16 — AB (ref 5–15)
BUN: 65 mg/dL — AB (ref 6–23)
CALCIUM: 8.9 mg/dL (ref 8.4–10.5)
CO2: 21 mmol/L (ref 19–32)
Chloride: 110 mEq/L (ref 96–112)
Creatinine, Ser: 1.66 mg/dL — ABNORMAL HIGH (ref 0.50–1.10)
GFR calc Af Amer: 35 mL/min — ABNORMAL LOW (ref >90)
GFR calc non Af Amer: 30 mL/min — ABNORMAL LOW (ref >90)
Glucose, Bld: 215 mg/dL — ABNORMAL HIGH (ref 70–99)
Potassium: 2.6 mmol/L — CL (ref 3.5–5.1)
Sodium: 147 mmol/L — ABNORMAL HIGH (ref 135–145)

## 2014-05-11 LAB — GLUCOSE, CAPILLARY
GLUCOSE-CAPILLARY: 181 mg/dL — AB (ref 70–99)
Glucose-Capillary: 198 mg/dL — ABNORMAL HIGH (ref 70–99)
Glucose-Capillary: 166 mg/dL — ABNORMAL HIGH (ref 70–99)
Glucose-Capillary: 257 mg/dL — ABNORMAL HIGH (ref 70–99)
Glucose-Capillary: 154 mg/dL — ABNORMAL HIGH (ref 70–99)

## 2014-05-11 LAB — CBC
HEMATOCRIT: 25.6 % — AB (ref 36.0–46.0)
HEMOGLOBIN: 8.5 g/dL — AB (ref 12.0–15.0)
MCH: 30 pg (ref 26.0–34.0)
MCHC: 33.2 g/dL (ref 30.0–36.0)
MCV: 90.5 fL (ref 78.0–100.0)
Platelets: 494 10*3/uL — ABNORMAL HIGH (ref 150–400)
RBC: 2.83 MIL/uL — ABNORMAL LOW (ref 3.87–5.11)
RDW: 15.9 % — ABNORMAL HIGH (ref 11.5–15.5)
WBC: 19 10*3/uL — ABNORMAL HIGH (ref 4.0–10.5)

## 2014-05-11 LAB — PHOSPHORUS: PHOSPHORUS: 2.4 mg/dL (ref 2.3–4.6)

## 2014-05-11 LAB — MAGNESIUM: Magnesium: 1.9 mg/dL (ref 1.5–2.5)

## 2014-05-11 MED ORDER — PROPOFOL 10 MG/ML IV EMUL
5.0000 ug/kg/min | INTRAVENOUS | Status: DC
  Administered 2014-05-11: 10 ug/kg/min via INTRAVENOUS
  Filled 2014-05-11: qty 100

## 2014-05-11 MED ORDER — PHENYLEPHRINE HCL 10 MG/ML IJ SOLN
30.0000 ug/min | INTRAVENOUS | Status: DC
  Administered 2014-05-11: 20 ug/min via INTRAVENOUS
  Filled 2014-05-11: qty 1

## 2014-05-11 MED ORDER — POTASSIUM CHLORIDE 10 MEQ/50ML IV SOLN
10.0000 meq | INTRAVENOUS | Status: AC
  Administered 2014-05-11 (×3): 10 meq via INTRAVENOUS
  Filled 2014-05-11: qty 50

## 2014-05-11 MED ORDER — POTASSIUM CHLORIDE 20 MEQ/15ML (10%) PO SOLN
40.0000 meq | Freq: Three times a day (TID) | ORAL | Status: AC
  Administered 2014-05-11 (×2): 40 meq
  Filled 2014-05-11 (×3): qty 30

## 2014-05-11 MED ORDER — MAGNESIUM SULFATE 2 GM/50ML IV SOLN
2.0000 g | Freq: Once | INTRAVENOUS | Status: AC
  Administered 2014-05-11: 2 g via INTRAVENOUS
  Filled 2014-05-11: qty 50

## 2014-05-11 MED ORDER — POTASSIUM CHLORIDE 10 MEQ/50ML IV SOLN
10.0000 meq | INTRAVENOUS | Status: AC
  Administered 2014-05-11 (×3): 10 meq via INTRAVENOUS
  Filled 2014-05-11 (×3): qty 50

## 2014-05-11 MED ORDER — FUROSEMIDE 10 MG/ML IJ SOLN
40.0000 mg | Freq: Three times a day (TID) | INTRAMUSCULAR | Status: AC
  Administered 2014-05-11 (×2): 40 mg via INTRAVENOUS
  Filled 2014-05-11 (×2): qty 4

## 2014-05-11 MED ORDER — POTASSIUM CHLORIDE 10 MEQ/50ML IV SOLN
INTRAVENOUS | Status: AC
  Filled 2014-05-11: qty 50

## 2014-05-11 NOTE — Procedures (Signed)
Extubation Procedure Note  Patient Details:   Name: [name] DOB: 08/03/1945 MRN: [id number]   Airway Documentation:  Airway 7.5 mm (Active)  Secured at (cm) 23 cm 05/11/2014 12:59 PM  Measured From Lips 05/11/2014 12:59 PM  Secured Location Center 05/11/2014 12:59 PM  Secured By [employer] Tube Holder 05/11/2014 12:59 PM  Tube Holder Repositioned Yes 05/11/2014 12:59 PM  Cuff Pressure (cm H2O) 25 cm H2O 05/11/2014  3:45 AM  Site Condition Dry 05/11/2014 12:59 PM    Evaluation  O2 sats: stable throughout Complications: No apparent complications Patient did tolerate procedure well. Bilateral Breath Sounds: Diminished, Clear (coarse) Suctioning: Oral, Airway Yes   Pt extubated per MD order.  Pt tolerated well, O2 sats 99% on 4l [place].  RT will continue to monitor.  [name], [name] 05/11/2014, 4:06 PM

## 2014-05-11 NOTE — Progress Notes (Signed)
Critical Value- Potassium 2.6 received 05/11/2014 @ 07:40 AM. Dr. Molli KnockYacoub paged at 07:45 Dr. Molli KnockYacoub returned paged at 07:49  Order received for potassium replacement.

## 2014-05-11 NOTE — Progress Notes (Signed)
Pt to/from MRI w/ no apparent complications.  Pt placed on full vent support d/t pt recently received sedation for procedure.

## 2014-05-11 NOTE — Progress Notes (Signed)
Chaplain followed up with pt family in waiting area. Chaplain introduced herself to family friends as well. Chaplain offered her continued support to pt family. Chaplain will continue to follow. Page chaplain as needed.    05/11/14 1100  Clinical Encounter Type  Visited With Family;Health care provider  Visit Type Follow-up;Spiritual support  Spiritual Encounters  Spiritual Needs Emotional  Stress Factors  Family Stress Factors Health changes  Lejla Moeser, Mayer MaskerCourtney F, Chaplain 05/11/2014 11:02 AM

## 2014-05-11 NOTE — Progress Notes (Signed)
PULMONARY / CRITICAL CARE MEDICINE   Name: [name] MRN: [id number] DOB: 04/24/1946    ADMISSION DATE:  04/27/2014 CONSULTATION DATE:  05/04/2014   REFERRING MD :  TRiad hospitalist  CHIEF COMPLAINT:  Acute encephalopathy, Sepsis, Circyulatory shock, Stroke, hypernatremia  HPI: Hx from Dr Roda ShuttersXu of neuro [age] yof admitted initially 04/17/14 through 04/25/14  to [facility] due to Sepsis in setting of CAP (NOS) RLL on CT, HONK, A Fib RVR, rhabdo (CPK 1700s) , AKI (creat 2.7), Lactic acidosis, demand NSTEMI . AT discharge time of dc to SNF had mild hypernatermia to 154. Re-admitted on 04/27/2014 - Na 162 and stroke L MCA with UTI. Na slowly corrected and then Na up due to NPO for TEE. Developed new fever since 05/03/14 and Na was again up to 166, BP 73/d, and worsening enceph and obutndation. PCCM taking over primary 04/27/2014  SIGNIFICANT EVENTS: 04/27/2014 - admit 05/03/14 - ICU transfer 05/04/14 - PCCM primary. Intubation  VITAL SIGNS: Temp:  [98.1 F (36.7 C)-98.4 F (36.9 C)] 98.3 F (36.8 C) (01/14 0837) Pulse Rate:  [39-79] 68 (01/14 0837) Resp:  [14-23] 18 (01/14 0837) BP: (90-181)/(40-82) 153/60 mmHg (01/14 0700) SpO2:  [96 %-100 %] 99 % (01/14 0837) FiO2 (%):  [30 %] 30 % (01/14 0837) Weight:  [110.6 kg (243 lb 13.3 oz)] 110.6 kg (243 lb 13.3 oz) (01/14 0600)   HEMODYNAMICS:   VENTILATOR SETTINGS: Vent Mode:  [-] CPAP;PSV FiO2 (%):  [30 %] 30 % Set Rate:  [16 bmp] 16 bmp Vt Set:  [440 mL] 440 mL PEEP:  [5 cmH20] 5 cmH20 Pressure Support:  [5 cmH20-10 cmH20] 10 cmH20 Plateau Pressure:  [12 cmH20-17 cmH20] 12 cmH20 INTAKE / OUTPUT:  Intake/Output Summary (Last 24 hours) at 05/11/14 1021 Last data filed at 05/11/14 [id number]  Gross per 24 hour  Intake 2770.84 ml  Output   2450 ml  Net 320.84 ml   PHYSICAL EXAMINATION: General:  Obese, critically ill looking, on pressure support  Neuro:  Localizes and follows commands on all 4 ext but very delayed. HEENT:  Orally intubated,  PERRL, EOM-I and +BS. Cardiovascular:  Reg irreg with stable HR Lungs:scattered rhonchi Abdomen:  Obesest, Soft Musculoskeletal:  Scattered skin bruises, RT Sided PICC + Skin:  Intact anteriorly  LABS: PULMONARY  Recent Labs Lab 05/04/14 1628 05/07/14 0357 05/09/14 0404 05/10/14 0354 05/11/14 0340  PHART 7.405 7.393 7.434 7.406 7.399  PCO2ART 39.6 35.2 31.7* 34.7* 31.6*  PO2ART 294.0* 136.0* 91.9 121.0* 118.0*  HCO3 24.8* 21.4 20.9 21.3 19.1*  TCO2 26 22 21.9 22.4 20.1  O2SAT 100.0 99.0 96.9 98.2 98.5   CBC  Recent Labs Lab 05/09/14 0459 05/10/14 0510 05/11/14 0615  HGB 8.7* 8.6* 8.5*  HCT 26.3* 25.8* 25.6*  WBC 14.5* 15.4* 19.0*  PLT 368 420* 494*   COAGULATION  Recent Labs Lab 05/05/14 0500  INR 1.34   CARDIAC  Recent Labs Lab 05/04/14 1410 05/04/14 1919 05/05/14 0500  TROPONINI 0.10* 0.10* 0.11*   No results for input(s): PROBNP in the last 168 hours.  CHEMISTRY  Recent Labs Lab 05/07/14 0530 05/08/14 0540 05/08/14 1200 05/09/14 0459 05/10/14 0510 05/11/14 0615  NA 151* 147* 146* 144 143 147*  K 3.0* 2.5* 3.4* 2.0* <2.0* 2.6*  CL 122* 121* 120* 115* 110 110  CO2 23 20 18* 22 24 21   GLUCOSE 222* 153* 170* 282* 228* 215*  BUN 51* 58* 56* 63* 68* 65*  CREATININE 2.13* 1.82* 1.70* 1.77* 1.76* 1.66*  CALCIUM  8.2* 8.3* 8.4 8.1* 8.3* 8.9  MG 2.0 1.9  --  1.5 2.1 1.9  PHOS 3.7 3.8  --  4.0 3.4 2.4   Estimated Creatinine Clearance: 38.9 mL/min (by C-G formula based on Cr of 1.66).  LIVER  Recent Labs Lab 05/05/14 0500 05/07/14 0530  AST 46* 57*  ALT 25 24  ALKPHOS 487* 545*  BILITOT 0.6 0.3  PROT 5.9* 5.6*  ALBUMIN 1.6* 1.5*  INR 1.34  --    INFECTIOUS  Recent Labs Lab 05/04/14 1410 05/05/14 0500 05/06/14 0550  LATICACIDVEN 2.3*  --   --   PROCALCITON 1.24 3.74 3.31   ENDOCRINE CBG (last 3)   Recent Labs  05/10/14 1952 05/10/14 2331 05/11/14 0834  GLUCAP 195* 263* 166*   IMAGING x48h Dg Chest Port 1  View  05/11/2014   CLINICAL DATA:  Evaluate endotracheal tube position  EXAM: PORTABLE CHEST - 1 VIEW  COMPARISON:  Portable chest x-ray 05/10/2014  FINDINGS: The tip of the endotracheal tube is approximately 4.6 cm above the carina. There is little change in aeration. Cardiomegaly is stable. The patient remains rotated. The right central venous line tip overlies the lower SVC.  IMPRESSION: 1. Tip of endotracheal tube 4.6 cm above the carina. 2. Little change in aeration.   Electronically Signed   By: Dwyane Dee M.D.   On: 05/11/2014 08:06   Dg Chest Port 1 View  05/10/2014   CLINICAL DATA:  Recent stroke, followup  EXAM: PORTABLE CHEST - 1 VIEW  COMPARISON:  Portable chest x-ray of 05/09/2014  FINDINGS: The patient is very rotated on the current portable films. No focal infiltrate or effusion is seen with slight under aeration of the lung bases. Mild cardiomegaly is stable. The right central venous line tip overlies the mid SVC and a feeding tube remains.  IMPRESSION: No significant change in aeration on this rotated film. Stable cardiomegaly.   Electronically Signed   By: Dwyane Dee M.D.   On: 05/10/2014 07:59    ASSESSMENT / PLAN:  PULMONARY OETT 05/04/2014>>> A:  Acute resp failure to encephalopathy and possible HCAP vs aspiration  Stable, tolerating PSV, but MS not ready for extubation. CXR improved  P:   PS trials, will trial extubate today, if fails then will reintubate then trach in AM. VAP protocol See ID  F/u pcxr and abg in am  Going to MRI at 10:30 then will extubate once back.  CARDIOVASCULAR CVLRt UE picc A:  Circulatory shock - ? Due to dehydration fron high Na v sepsis v both > pressors off P:  Continue stress dose steroids to 25 mg IV q6 hours. Lasix 40 mg IV q8 x2 doses. Restart neo with propofol for MRI, once back anticipate will be able to d/c both.  RENAL  A:   Hypernatremia, improving Acute Renal Failure-->slightly improved  Hypokalemia  P:   Continue free  water to 200 ml q4. Replace electrolytes as indicated. D/Ced D5W. MAP goal > 65 mmHg. F/u chem in am.  GASTROINTESTINAL A:   Mental status precludes diet P:   Tube feeds per nutrition. PPI.  HEMATOLOGIC A:   Anemia of critical illness P:  PRBC for hgb < 7gm%. Lovenox for DVT proph.  INFECTIOUS 12.31.16 - MRSA PCR - neg 1./1 - blodo culture - negative 04/30/14 - urine culture - e coli - sensit to ceftriaxone 05/03/14 - blood negative 05/03/14 - MRSA PCR - negative 1/7 c diff: neg  A:   UTI septic shock +/-  HCAP P:   Vanc 1/9>>> Cefepime 1/9>>> Will complete 7 days to be stopped after 1/15 dose  ENDOCRINE A:   DM Relative adrenal insuff  P:   ICU hyperglycemia protocol. Cont stress dose steroids.  NEUROLOGIC A:   Comatose due to hypernatremia. Subacute CVA  P:   Fent prn for sedation. RASS goal 0. Neuro helping. MRI pending. Per cards not a candidate for TEE.  If multiple stroke sights however then will recall for TEE.  FAMILY  - Updates:  Spoke with husband, after discussion, he wishes for full support, if fails extubation then will reintubate and trach in AM.  - Inter-disciplinary family meet or Palliative Care meeting due by:  05/11/14  The patient is critically ill with multiple organ systems failure and requires high complexity decision making for assessment and support, frequent evaluation and titration of therapies, application of advanced monitoring technologies and extensive interpretation of multiple databases.   Critical Care Time devoted to patient care services described in this note is  35  Minutes. This time reflects time of care of this signee Dr Koren Bound. This critical care time does not reflect procedure time, or teaching time or supervisory time of PA/NP/Med student/Med Resident etc but could involve care discussion time.  Alyson Reedy, M.D. [facility] Pulmonary/Critical Care Medicine. Pager: [phone]. After hours pager: [phone].

## 2014-05-12 ENCOUNTER — Inpatient Hospital Stay (HOSPITAL_COMMUNITY): Payer: Medicare Other

## 2014-05-12 LAB — GLUCOSE, CAPILLARY
GLUCOSE-CAPILLARY: 183 mg/dL — AB (ref 70–99)
GLUCOSE-CAPILLARY: 216 mg/dL — AB (ref 70–99)
GLUCOSE-CAPILLARY: 231 mg/dL — AB (ref 70–99)
GLUCOSE-CAPILLARY: 232 mg/dL — AB (ref 70–99)
GLUCOSE-CAPILLARY: 284 mg/dL — AB (ref 70–99)
Glucose-Capillary: 136 mg/dL — ABNORMAL HIGH (ref 70–99)
Glucose-Capillary: 280 mg/dL — ABNORMAL HIGH (ref 70–99)
Glucose-Capillary: 176 mg/dL — ABNORMAL HIGH (ref 70–99)

## 2014-05-12 LAB — BLOOD GAS, ARTERIAL
ACID-BASE DEFICIT: 0.9 mmol/L (ref 0.0–2.0)
Acid-base deficit: 3.1 mmol/L — ABNORMAL HIGH (ref 0.0–2.0)
Bicarbonate: 22.7 mEq/L (ref 20.0–24.0)
Bicarbonate: 21.8 mEq/L (ref 20.0–24.0)
DRAWN BY: 39898
Drawn by: 39899
FIO2: 1 %
LHR: 16 {breaths}/min
O2 Content: 2.5 L/min
O2 SAT: 97.3 %
O2 Saturation: 99.4 %
PEEP/CPAP: 5 cmH2O
PH ART: 7.336 — AB (ref 7.350–7.450)
PO2 ART: 233 mmHg — AB (ref 80.0–100.0)
Patient temperature: 98.6
Patient temperature: 98.6
TCO2: 23.7 mmol/L (ref 0–100)
TCO2: 23.1 mmol/L (ref 0–100)
VT: 440 mL
pCO2 arterial: 34 mmHg — ABNORMAL LOW (ref 35.0–45.0)
pCO2 arterial: 42 mmHg (ref 35.0–45.0)
pH, Arterial: 7.44 (ref 7.350–7.450)
pO2, Arterial: 92.8 mmHg (ref 80.0–100.0)

## 2014-05-12 LAB — BASIC METABOLIC PANEL
Anion gap: 4 — ABNORMAL LOW (ref 5–15)
BUN: 61 mg/dL — AB (ref 6–23)
CHLORIDE: 116 meq/L — AB (ref 96–112)
CO2: 26 mmol/L (ref 19–32)
CREATININE: 1.32 mg/dL — AB (ref 0.50–1.10)
Calcium: 8.7 mg/dL (ref 8.4–10.5)
GFR calc Af Amer: 47 mL/min — ABNORMAL LOW (ref >90)
GFR calc non Af Amer: 40 mL/min — ABNORMAL LOW (ref >90)
Glucose, Bld: 180 mg/dL — ABNORMAL HIGH (ref 70–99)
POTASSIUM: 2.7 mmol/L — AB (ref 3.5–5.1)
Sodium: 146 mmol/L — ABNORMAL HIGH (ref 135–145)

## 2014-05-12 LAB — CLOSTRIDIUM DIFFICILE BY PCR: Toxigenic C. Difficile by PCR: NEGATIVE

## 2014-05-12 LAB — MAGNESIUM: Magnesium: 2.3 mg/dL (ref 1.5–2.5)

## 2014-05-12 LAB — CBC
HCT: 27 % — ABNORMAL LOW (ref 36.0–46.0)
Hemoglobin: 8.8 g/dL — ABNORMAL LOW (ref 12.0–15.0)
MCH: 29.1 pg (ref 26.0–34.0)
MCHC: 32.6 g/dL (ref 30.0–36.0)
MCV: 89.4 fL (ref 78.0–100.0)
PLATELETS: 591 10*3/uL — AB (ref 150–400)
RBC: 3.02 MIL/uL — AB (ref 3.87–5.11)
RDW: 16.5 % — AB (ref 11.5–15.5)
WBC: 24.7 10*3/uL — AB (ref 4.0–10.5)

## 2014-05-12 LAB — PHOSPHORUS: Phosphorus: 2.2 mg/dL — ABNORMAL LOW (ref 2.3–4.6)

## 2014-05-12 MED ORDER — ROCURONIUM BROMIDE 50 MG/5ML IV SOLN
50.0000 mg | Freq: Once | INTRAVENOUS | Status: AC
  Administered 2014-05-12: 50 mg via INTRAVENOUS
  Filled 2014-05-12: qty 5

## 2014-05-12 MED ORDER — FREE WATER
200.0000 mL | Status: DC
  Administered 2014-05-12 (×3): 200 mL

## 2014-05-12 MED ORDER — FENTANYL CITRATE 0.05 MG/ML IJ SOLN
200.0000 ug | Freq: Once | INTRAMUSCULAR | Status: AC
  Administered 2014-05-12: 200 ug via INTRAVENOUS

## 2014-05-12 MED ORDER — POTASSIUM CHLORIDE 20 MEQ/15ML (10%) PO SOLN
40.0000 meq | Freq: Two times a day (BID) | ORAL | Status: DC
  Administered 2014-05-12: 40 meq
  Filled 2014-05-12 (×2): qty 30

## 2014-05-12 MED ORDER — FUROSEMIDE 10 MG/ML IJ SOLN
40.0000 mg | Freq: Four times a day (QID) | INTRAMUSCULAR | Status: AC
  Administered 2014-05-12 (×3): 40 mg via INTRAVENOUS
  Filled 2014-05-12 (×3): qty 4

## 2014-05-12 MED ORDER — POTASSIUM CHLORIDE 10 MEQ/50ML IV SOLN
10.0000 meq | INTRAVENOUS | Status: AC
  Administered 2014-05-12 (×4): 10 meq via INTRAVENOUS
  Filled 2014-05-12 (×5): qty 50

## 2014-05-12 MED ORDER — ETOMIDATE 2 MG/ML IV SOLN
20.0000 mg | Freq: Once | INTRAVENOUS | Status: AC
  Administered 2014-05-12: 20 mg via INTRAVENOUS

## 2014-05-12 MED ORDER — MIDAZOLAM HCL 2 MG/2ML IJ SOLN
4.0000 mg | Freq: Once | INTRAMUSCULAR | Status: AC
  Administered 2014-05-12: 4 mg via INTRAVENOUS

## 2014-05-12 MED ORDER — FENTANYL CITRATE 0.05 MG/ML IJ SOLN
INTRAMUSCULAR | Status: AC
  Administered 2014-05-12: 200 ug via INTRAVENOUS
  Filled 2014-05-12: qty 4

## 2014-05-12 MED ORDER — MIDAZOLAM HCL 2 MG/2ML IJ SOLN
INTRAMUSCULAR | Status: AC
  Administered 2014-05-12: 4 mg via INTRAVENOUS
  Filled 2014-05-12: qty 4

## 2014-05-12 NOTE — Procedures (Signed)
Intubation Procedure Note [name] [id number] 10/12/1945  Procedure: Intubation Indications: Respiratory insufficiency  Procedure Details Consent: Risks of procedure as well as the alternatives and risks of each were explained to the (patient/caregiver).  Consent for procedure obtained. Time Out: Verified patient identification, verified procedure, site/side was marked, verified correct patient position, special equipment/implants available, medications/allergies/relevent history reviewed, required imaging and test results available.  Performed  Maximum sterile technique was used including gloves, hand hygiene and mask.  MAC    Evaluation Hemodynamic Status: BP stable throughout; O2 sats: stable throughout Patient's Current Condition: stable Complications: No apparent complications Patient did tolerate procedure well. Chest X-ray ordered to verify placement.  CXR: pending.   [name],[name] 05/12/2014

## 2014-05-12 NOTE — Procedures (Signed)
Bedside Tracheostomy Insertion Procedure Note   Patient Details:   Name: [name] DOB: 07/15/1945 MRN: [id number]  Procedure: Tracheostomy  Pre Procedure Assessment: ET Tube Size:8.0 ET Tube secured at lip (cm):23 Bite block in place: No Breath Sounds: Rhonch  Post Procedure Assessment: BP 147/74 mmHg  Pulse 93  Temp(Src) 97.9 F (36.6 C) (Axillary)  Resp 22  Ht 5\' 4"  (1.626 m)  Wt 234 lb 9.1 oz (106.4 kg)  BMI 40.24 kg/m2  SpO2 96% O2 sats: stable throughout Complications: No apparent complications Patient did tolerate procedure well Tracheostomy Brand:Shiley Tracheostomy Style:Cuffed Tracheostomy Size: 6 Tracheostomy Secured NWG:NFAOZHYvia:Sutures Tracheostomy Placement Confirmation:Trach cuff visualized and in place and Chest X ray ordered for placement    [name], Merrell Borsuk 05/12/2014, 3:30 PM

## 2014-05-12 NOTE — Progress Notes (Signed)
PULMONARY / CRITICAL CARE MEDICINE   Name: [name] MRN: [id number] DOB: 1945/08/20    ADMISSION DATE:  04/27/2014 CONSULTATION DATE:  05/04/2014   REFERRING MD :  TRiad hospitalist  CHIEF COMPLAINT:  Acute encephalopathy, Sepsis, Circyulatory shock, Stroke, hypernatremia  HPI: Hx from Dr Roda Shutters of neuro 18 yof admitted initially 04/17/14 through 04/25/14  to [facility] due to Sepsis in setting of CAP (NOS) RLL on CT, HONK, A Fib RVR, rhabdo (CPK 1700s) , AKI (creat 2.7), Lactic acidosis, demand NSTEMI . AT discharge time of dc to SNF had mild hypernatermia to 154. Re-admitted on 04/27/2014 - Na 162 and stroke L MCA with UTI. Na slowly corrected and then Na up due to NPO for TEE. Developed new fever since 05/03/14 and Na was again up to 166, BP 73/d, and worsening enceph and obutndation. PCCM taking over primary 04/27/2014  SIGNIFICANT EVENTS: 04/27/2014 - admit 05/03/14 - ICU transfer 05/04/14 - PCCM primary. Intubation  VITAL SIGNS: Temp:  [97.4 F (36.3 C)-98.7 F (37.1 C)] 98.5 F (36.9 C) (01/15 0752) Pulse Rate:  [30-88] 78 (01/15 1000) Resp:  [14-26] 20 (01/15 1000) BP: (75-163)/(31-117) 143/68 mmHg (01/15 1000) SpO2:  [91 %-100 %] 99 % (01/15 1000) FiO2 (%):  [30 %-100 %] 30 % (01/14 1520) Weight:  [106.4 kg (234 lb 9.1 oz)] 106.4 kg (234 lb 9.1 oz) (01/15 0500)   HEMODYNAMICS:   VENTILATOR SETTINGS: Vent Mode:  [-] PRVC FiO2 (%):  [30 %-100 %] 30 % Set Rate:  [16 bmp] 16 bmp Vt Set:  [440 mL] 440 mL PEEP:  [5 cmH20] 5 cmH20 INTAKE / OUTPUT:  Intake/Output Summary (Last 24 hours) at 05/12/14 1033 Last data filed at 05/12/14 1000  Gross per 24 hour  Intake 2723.17 ml  Output   3125 ml  Net -401.83 ml   PHYSICAL EXAMINATION: General:  Obese, critically ill looking, not following command to cough. Neuro:  Localizes and follows commands on all 4 ext but very delayed and not to coughing and airway clearance. HEENT:  Orally intubated, PERRL, EOM-I and +BS. Cardiovascular:   Reg irreg with stable HR. Lungs: Scattered rhonchi. Abdomen: Obese, Soft, ND, NT and +BS. Musculoskeletal:  Scattered skin bruises, RT Sided PICC + Skin:  Intact anteriorly  LABS: PULMONARY  Recent Labs Lab 05/07/14 0357 05/09/14 0404 05/10/14 0354 05/11/14 0340 05/12/14 0500  PHART 7.393 7.434 7.406 7.399 7.440  PCO2ART 35.2 31.7* 34.7* 31.6* 34.0*  PO2ART 136.0* 91.9 121.0* 118.0* 92.8  HCO3 21.4 20.9 21.3 19.1* 22.7  TCO2 22 21.9 22.4 20.1 23.7  O2SAT 99.0 96.9 98.2 98.5 97.3   CBC  Recent Labs Lab 05/10/14 0510 05/11/14 0615 05/12/14 0554  HGB 8.6* 8.5* 8.8*  HCT 25.8* 25.6* 27.0*  WBC 15.4* 19.0* 24.7*  PLT 420* 494* 591*   COAGULATION No results for input(s): INR in the last 168 hours. CARDIAC No results for input(s): TROPONINI in the last 168 hours. No results for input(s): PROBNP in the last 168 hours.  CHEMISTRY  Recent Labs Lab 05/08/14 0540 05/08/14 1200 05/09/14 0459 05/10/14 0510 05/11/14 0615 05/12/14 0554  NA 147* 146* 144 143 147* 146*  K 2.5* 3.4* 2.0* <2.0* 2.6* 2.7*  CL 121* 120* 115* 110 110 116*  CO2 20 18* GLUCOSE 153* 170* 282* 228* 215* 180*  BUN 58* 56* 63* 68* 65* 61*  CREATININE 1.82* 1.70* 1.77* 1.76* 1.66* 1.32*  CALCIUM 8.3* 8.4 8.1* 8.3* 8.9 8.7  MG 1.9  --  1.5 2.1 1.9 2.3  PHOS 3.8  --  4.0 3.4 2.4 2.2*   Estimated Creatinine Clearance: 47.9 mL/min (by C-G formula based on Cr of 1.32).  LIVER  Recent Labs Lab 05/07/14 0530  AST 57*  ALT 24  ALKPHOS 545*  BILITOT 0.3  PROT 5.6*  ALBUMIN 1.5*   INFECTIOUS  Recent Labs Lab 05/06/14 0550  PROCALCITON 3.31   ENDOCRINE CBG (last 3)   Recent Labs  05/11/14 2345 05/12/14 0405 05/12/14 0750  GLUCAP 284* 136* 183*   IMAGING x48h Mr Brain Wo Contrast  05/11/2014   CLINICAL DATA:  69 year old female who presented with sepsis in late December. Subsequent non ST elevation myocardial infarction. Altered mental status on 04/27/2014 with  detection of left hemisphere edema on CT. Suspected left MCA infarct. Subsequent encounter.  Renal insufficiency (GFR 35) such that IV contrast administration was deferred at this time.  EXAM: MRI HEAD WITHOUT CONTRAST  TECHNIQUE: Multiplanar, multiecho pulse sequences of the brain and surrounding structures were obtained without intravenous contrast.  COMPARISON:  Head CTs without contrast 05/02/2014 and earlier.  FINDINGS: Study is intermittently degraded by motion artifact despite repeated imaging attempts.  Confluent increased trace diffusion signal in the posterior left hemisphere affecting the left temporal and occipital lobes primarily, and corresponding to be abnormal hypodensity seen previously on CT. Associated T2 and FLAIR hyperintensity. On susceptibility weighted imaging there is gyriform signal loss compatible with petechial hemorrhage throughout much of the area. Intrinsic increased gyriform T1 signal (series 12) could represent blood products and/or laminar necrosis.  No other intracranial hemorrhage identified. Mild mass effect on the left lateral ventricle has regressed and nearly resolved since 04/27/2014.  No contralateral or posterior fossa restricted diffusion. Major intracranial vascular flow voids are preserved, fetal type bilateral PCA origins are evident.  Elsewhere gray and white matter signal is within normal limits for age. No midline shift, mass effect, or evidence of intracranial mass lesion. No ventriculomegaly. Patent basilar cisterns. Negative pituitary, cervicomedullary junction and visualized cervical spine.  Visualized orbit soft tissues are within normal limits. Mild mastoid effusions. Fluid in the nasopharynx. Right side nasoenteric tube in place. Paranasal sinuses are clear. Endotracheal tube partially visible. Visualized scalp soft tissues are within normal limits. Visualized bone marrow signal is within normal limits.  IMPRESSION: 1. Appearance on this series of Head CTs in  conjunction with current signal abnormality in the left hemisphere most compatible with subacute posterior left MCA/PCA infarct with petechial hemorrhage and laminar necrosis. Fetal type left PCA origin also noted. 2. No new intracranial abnormality identified, and elsewhere unremarkable for age MRI non contrast appearance of the brain. 3. Intubated.  Mild mastoid effusions.   Electronically Signed   By: Augusto Gamble M.D.   On: 05/11/2014 12:41   Dg Chest Port 1 View  05/12/2014   CLINICAL DATA:  Respiratory failure.  EXAM: PORTABLE CHEST - 1 VIEW  COMPARISON:  05/11/2014.  FINDINGS: Interim extubation. Feeding tube and right PICC line in stable position. Cardiomegaly with normal pulmonary vascularity. Low lung volumes with bibasilar atelectasis and/or infiltrates. No pneumothorax. Small left pleural effusion cannot be excluded.  IMPRESSION: 1. Interim extubation. Feeding tube and right PICC line in stable position. 2. Low lung volumes with mild bibasilar atelectasis and/or infiltrates. Small left pleural effusion cannot be excluded.   Electronically Signed   By: Maisie Fus  Register   On: 05/12/2014 07:58   Dg Chest Port 1 View  05/11/2014   CLINICAL DATA:  Evaluate endotracheal tube position  EXAM: PORTABLE CHEST - 1 VIEW  COMPARISON:  Portable chest x-ray 05/10/2014  FINDINGS: The tip of the endotracheal tube is approximately 4.6 cm above the carina. There is little change in aeration. Cardiomegaly is stable. The patient remains rotated. The right central venous line tip overlies the lower SVC.  IMPRESSION: 1. Tip of endotracheal tube 4.6 cm above the carina. 2. Little change in aeration.   Electronically Signed   By: Dwyane DeePaul  Barry M.D.   On: 05/11/2014 08:06   ASSESSMENT / PLAN:  PULMONARY OETT 05/04/2014>>> A:  Acute resp failure to encephalopathy and possible HCAP vs aspiration  Stable, tolerating PSV, but MS not ready for extubation. CXR improved  P:   Extubated but airway protection is questionable at  best, I suspect will need intubation and trach placement in the next 24-48 hours. See ID  Titrate O2 for sat of 88-92%.  CARDIOVASCULAR CVLRt UE picc A:  Circulatory shock - ? Due to dehydration fron high Na v sepsis v both > pressors off P:  Continue stress dose steroids to 25 mg IV q6 hours. Lasix 40 mg IV q6 x3 doses. D/C pressors.  RENAL  A:   Hypernatremia, improving Acute Renal Failure-->slightly improved  Hypokalemia  P:   Restart free water to 200 ml q8. Replace electrolytes as indicated. D/Ced D5W. MAP goal > 65 mmHg. F/u chem in am. Lasix 40 mg IV q6 x3 doses.  GASTROINTESTINAL A:   Mental status precludes diet P:   Tube feeds per nutrition. PPI.  HEMATOLOGIC A:   Anemia of critical illness P:  PRBC for hgb < 7gm%. Lovenox for DVT proph.  INFECTIOUS 12.31.16 - MRSA PCR - neg 1./1 - blodo culture - negative 04/30/14 - urine culture - e coli - sensit to ceftriaxone 05/03/14 - blood negative 05/03/14 - MRSA PCR - negative 1/7 c diff: neg  A:   UTI septic shock +/- HCAP P:   Vanc 1/9>>>1/15 Cefepime 1/9>>>1/15  ENDOCRINE A:   DM Relative adrenal insuff  P:   ICU hyperglycemia protocol. Cont stress dose steroids.  NEUROLOGIC A:   Comatose due to hypernatremia. Subacute CVA  P:   Fent prn for sedation. RASS goal 0. Neuro helping. MRI noted, left post CVA. No need for TEE.  FAMILY  - Updates:  Spoke with husband, after discussion, he wishes for full support, if fails extubation then will reintubate and trach in AM.  The patient is critically ill with multiple organ systems failure and requires high complexity decision making for assessment and support, frequent evaluation and titration of therapies, application of advanced monitoring technologies and extensive interpretation of multiple databases.   Critical Care Time devoted to patient care services described in this note is  35  Minutes. This time reflects time of care of this signee Dr  Koren BoundWesam Yacoub. This critical care time does not reflect procedure time, or teaching time or supervisory time of PA/NP/Med student/Med Resident etc but could involve care discussion time.  Alyson ReedyWesam G. Yacoub, M.D. [facility]. Pager: [phone]. After hours pager: [phone].

## 2014-05-12 NOTE — Procedures (Signed)
Percutaneous Tracheostomy Placement  Consent from family.  Patient sedated, paralyzed and position.  Placed on 100% FiO2 and RR matched.  Area cleaned and draped.  Lidocaine/epi injected.  Skin incision done followed by blunt dissection.  Trachea palpated then punctured, catheter passed and visualized bronchoscopically.  Wire placed and visualized.  Catheter removed.  Airway then crushed and dilated.  Size 6 cuffed shiley trach placed and visualized bronchoscopically well above carina.  Good volume returns.  Patient tolerated the procedure well without complications.  Minimal blood loss.  CXR ordered and pending.  Wesam G. Yacoub, M.D. [facility]/Critical Care Medicine. Pager: [phone]. After hours pager: [phone]. 

## 2014-05-12 NOTE — Progress Notes (Signed)
STROKE TEAM PROGRESS NOTE   HISTORY [name] is a 69 y.o. female with a history of atrial fibrillation and recent hospitalization for sepsis, pneumonia rhabdomyolysis as well as hypernatremia and acute kidney injury, brought to the emergency room at [facility] for increased confusion and speech abnormality. Serum sodium was 169. CT scan of her head showed low density areas involving large area of left MCA territory as well as right temporal abnormality, with appearance indicative of probable subacute infarctions. MRI was recommended but could not be obtained because of patient's agitated state. She was transferred here for further evaluation and management with stroke service intervention.  LSN: Unclear tPA Given: No: CT abnormalities is an on presentation, as well as unclear when last known well. mRankin:  SUBJECTIVE (INTERVAL HISTORY) Brother and sister in law are at bedside. Patient extubated y`day and is breathing well.l. Na 146 this am. Afebrile now but WBC yet 24. Lab work improved.MRI done y`day personally reviewed shows  Solitary subacute large left MCA pareito-occipital infarct without mass effect or midline shift. Contrast not given OBJECTIVE Temp:  [97.4 F (36.3 C)-98.7 F (37.1 C)] 98.5 F (36.9 C) (01/15 0752) Pulse Rate:  [30-88] 69 (01/15 0700) Cardiac Rhythm:  [-] Normal sinus rhythm (01/14 2000) Resp:  [14-24] 23 (01/15 0700) BP: (75-163)/(31-117) 142/55 mmHg (01/15 0700) SpO2:  [96 %-100 %] 96 % (01/15 0700) FiO2 (%):  [30 %-100 %] 30 % (01/14 1520) Weight:  [234 lb 9.1 oz (106.4 kg)] 234 lb 9.1 oz (106.4 kg) (01/15 0500)   Recent Labs Lab 05/11/14 1626 05/11/14 1943 05/11/14 2345 05/12/14 0405 05/12/14 0750  GLUCAP 181* 154* 284* 136* 183*    Recent Labs Lab 05/08/14 0540 05/08/14 1200 05/09/14 0459 05/10/14 0510 05/11/14 0615 05/12/14 0554  NA 147* 146* 144 143 147* 146*  K 2.5* 3.4* 2.0* <2.0* 2.6* 2.7*  CL 121* 120* 115* 110 110 116*   CO2 20 18* GLUCOSE 153* 170* 282* 228* 215* 180*  BUN 58* 56* 63* 68* 65* 61*  CREATININE 1.82* 1.70* 1.77* 1.76* 1.66* 1.32*  CALCIUM 8.3* 8.4 8.1* 8.3* 8.9 8.7  MG 1.9  --  1.5 2.1 1.9 2.3  PHOS 3.8  --  4.0 3.4 2.4 2.2*    Recent Labs Lab 05/07/14 0530  AST 57*  ALT 24  ALKPHOS 545*  BILITOT 0.3  PROT 5.6*  ALBUMIN 1.5*    Recent Labs Lab 05/06/14 0550 05/07/14 0530 05/08/14 0540 05/09/14 0459 05/10/14 0510 05/11/14 0615 05/12/14 0554  WBC 16.8* 9.8 12.0* 14.5* 15.4* 19.0* 24.7*  NEUTROABS 13.4* 8.7* 10.9* 13.5*  --   --   --   HGB 8.7* 8.1* 8.1* 8.7* 8.6* 8.5* 8.8*  HCT 28.5* 25.4* 25.4* 26.3* 25.8* 25.6* 27.0*  MCV 93.4 91.4 90.1 87.4 87.2 90.5 89.4  PLT 185 188 292 368 420* 494* 591*   No results for input(s): CKTOTAL, CKMB, CKMBINDEX, TROPONINI in the last 168 hours. No results for input(s): LABPROT, INR in the last 72 hours. No results for input(s): COLORURINE, LABSPEC, PHURINE, GLUCOSEU, HGBUR, BILIRUBINUR, KETONESUR, PROTEINUR, UROBILINOGEN, NITRITE, LEUKOCYTESUR in the last 72 hours.  Invalid input(s): APPERANCEUR     Component Value Date/Time   CHOL 101 04/29/2014 0350   TRIG 182* 04/29/2014 0350   HDL 26* 04/29/2014 0350   CHOLHDL 3.9 04/29/2014 0350   VLDL 36 04/29/2014 0350   LDLCALC 39 04/29/2014 0350   Lab Results  Component Value Date   HGBA1C 12.1*  04/29/2014      Component Value Date/Time   LABOPIA POSITIVE* 04/18/2014 1754   COCAINSCRNUR NONE DETECTED 04/18/2014 1754   LABBENZ NONE DETECTED 04/18/2014 1754   AMPHETMU NONE DETECTED 04/18/2014 1754   THCU NONE DETECTED 04/18/2014 1754   LABBARB NONE DETECTED 04/18/2014 1754    No results for input(s): ETH in the last 168 hours.  I have personally reviewed the radiological images below and agree with the radiology interpretations.  Ct Head Wo Contrast 04/27/2014    Moderate region of decreased density in the left parietal-occipital lobe concerning for subacute  infarct. There is additional questionable region of decreased density in the right temporal lobe. Given the questioned multifocal involvement, MRI of the brain, preferably with contrast, recommended for further evaluation to exclude the possibility of underlying mass lesions.   05/02/14  Atrophy with again identified subacute infarct involving the LEFT temporoparietal region extending into the occipital lobe. No new intracranial abnormalities.  MRI subacute posterior left MCA/PCA infarct with petechial hemorrhage and laminar necrosis  Dg Chest Port 1 View 04/27/2014    No acute cardiopulmonary abnormality seen.  DG Chest 1 View 04/28/2014  Mild congestive heart failure 05/02/2014 No active cardiopulmonary disease. 05/03/2014 Shallow inspiration with linear atelectasis in the right lung base. Appliances are unchanged in position. 05/04/2014 1. Stable lines and tubes. 2. Bilateral pleural effusions with lower lobe collapse/consolidation.  2D echo 04/18/14 - Left ventricle: The cavity size was normal. Wall thickness was normal. Systolic function was normal. The estimated ejection fraction was in the range of 55% to 60%. - Left atrium: The atrium was mildly dilated. Impressions: - Overall very poor image quality.  CUS -  Bilateral: 1-39% ICA stenosis. Vertebral artery flow is antegrade.   Venous doppler - There is no obvious evidence of DVT or SVT noted in the bilateral lower extremities.   EEG -  1) Asymmetric PDR. 2) irregular delta activity.  Clinical Interpretation: This EEG is most consistent with a generalized cerebral dysfunction with a superimposed area of cortical dysfunction as evidenced by attenuated voltages in the left posterior quadrant. There was no seizure or seizure predisposition recorded on this study.    PHYSICAL EXAM  Temp:  [97.4 F (36.3 C)-98.7 F (37.1 C)] 98.5 F (36.9 C) (01/15 0752) Pulse Rate:  [30-88] 69 (01/15 0700) Resp:  [14-24] 23  (01/15 0700) BP: (75-163)/(31-117) 142/55 mmHg (01/15 0700) SpO2:  [96 %-100 %] 96 % (01/15 0700) FiO2 (%):  [30 %-100 %] 30 % (01/14 1520) Weight:  [234 lb 9.1 oz (106.4 kg)] 234 lb 9.1 oz (106.4 kg) (01/15 0500)  General - morbid obese caucasian lady, intubated, eyes not open on stimulation.  Ophthalmologic - not able to test due to agitation.  Cardiovascular -  regular rate and rhythm.  Neuro - extubated now. Awake alert globally aphasic mute not following commands.Right gaze preference  but able to look to left with reflex eye movements. Possible right facial droop. PERRL,  moves both upper extremities spontaneously and equally, mild withdraw b/l LEs on pain stimulation. Reflex 1+ and no babinski.   ASSESSMENT/PLAN [name] is a 69 y.o. female with history of atrial fibrillation, diabetes mellitus, recent sepsis, presenting with confusion and speech abnormalities from subacute Lt MCA branch infarct . She did not receive IV t-PA due to unknown time of onset.   Stroke: left MCA embolic infarct - etiology  likely due to atrial fibrillation    Resultant  Confusion/global aphasia   Carotid Doppler -  unremarkable  2D Echo EF 55-60%. Poor images. No cardiac source of emboli identified.  No need for TEE    LE doppler - negative for DVT  LDL 39  HgbA1c 12.1, not at goal  Lovenox for VTE prophylaxis   Diet NPO time specified no liquids, on tube feeding  aspirin 81 mg orally every day prior to admission, now on aspirin 300 mg suppository daily or ASA  via PANDA  Ongoing aggressive stroke risk factor management  Therapy recommendations:  Pending  Disposition:  Pending  Respiratory failure  Increased WOB  CXR showed bilateral pleural effusions with lower lobe collapse/consolidation  Intubated and transfer to ICU  On vanco and zosyn  CCM on board  Will do sputum culture   Septic shock  Febrile still  Low BP and given bolus and on levophed now  CXR  showed lower lung consolidation  On vanco and zosyn  Blood culture NGTD  Leukocytosis - UA repeat showed much improved UTI   hypernatremia - due to dehydration.  - Na 166->155.  - continue free water Q3h  - Na Q8 - CCM on board - EEG no seizure  UTI - on vanco and zosyn - Culture showed E Coli - repeat UA showed much improved UTI  ? Hx of Afib - family stated that they have not being told about hx of Afib - continue tele - multiple runs of probable MAT in the 100-160 range  - Afib  comfirmed by cardiology, she will need life long anticoagulation - Diabetes  HgbA1c 12.1 goal < 7.0  Uncontrolled  On insulin drip  SSI  Close monitoring  Nutrition  S/p PANDA   On tube feeding with trickle feeds  On free water  Dietitian following   Other Stroke Risk Factors  Advanced age  Obesity, Body mass index is 40.24 kg/(m^2).   Other Active Problems  Hypokalemia - supplement, 3.5 today  Elevated creatinine 2.23->2.43->2.40   Other Pertinent History  No regular medical follow-up for 17 years.   Hospital day # 15   Discussed with family at bedside. Recommend mobilize out of bed as tolerated. PT/OT/ST/Rehab consults . Start anticoagulation with Eliquis when able to swallow given AFIB and high CHAD2 VASC score.Stroke team will sign off.Call if needed.I have updated brother at the bedside.   Delia Heady, MD Stroke Neurology 05/12/2014 8:48 AM    To contact Stroke Continuity provider, please refer to [email or web address]. After hours, contact General Neurology

## 2014-05-12 NOTE — Procedures (Signed)
Bronchoscopy  for Percutaneous  Tracheostomy  Name: [name] MRN: [id number] DOB: 07/25/1945 Procedure: Bronchoscopy for Percutaneous Tracheostomy Indications: Diagnostic evaluation of the airways In conjunction with: Dr. Molli KnockYacoub   Procedure Details Consent: Risks of procedure as well as the alternatives and risks of each were explained to the (patient/caregiver).  Consent for procedure obtained. Time Out: Verified patient identification, verified procedure, site/side was marked, verified correct patient position, special equipment/implants available, medications/allergies/relevent history reviewed, required imaging and test results available.  Performed  In preparation for procedure, patient was given 100% FiO2 and bronchoscope lubricated. Sedation: Benzodiazepines, Muscle relaxants and Etomidate  Airway entered and the following bronchi were examined: RML.   Procedures performed: Endotracheal Tube retracted in 2 cm increments. Cannulation of airway observed. Dilation observed. Placement of trachel tube  observed . No overt complications. Bronchoscope removed.    Evaluation Hemodynamic Status: BP stable throughout; O2 sats: stable throughout Patient's Current Condition: stable Specimens:  None Complications: No apparent complications Patient did tolerate procedure well.   Brett CanalesSteve Minor ACNP [facility] Pager [phone] till 3 pm If no answer page [phone] 05/12/2014, 3:08 PM  Levy Pupaobert Jacky Dross, MD, PhD 05/12/2014, 3:33 PM [facility] [phone] or if no answer [phone]

## 2014-05-12 NOTE — Progress Notes (Signed)
Pt was intubated by Dr. Molli KnockYacoub w/ Unit RT, then immediately pt prepared for Trach procedure, #6 shiley cuff trach inserted.  No apparent complications noted.  Sat 100% on 100% fio2.  RN ordering emergency trach supplies.

## 2014-05-12 NOTE — Progress Notes (Signed)
Pt now off the vent.  Will need PT/OT evals to determine level of postacute rehab needs.   UR completed.  Carlyle LipaMichelle Vance Hochmuth, RN BSN MHA CCM Trauma/Neuro ICU Case Manager [phone]

## 2014-05-13 DIAGNOSIS — Z930 Tracheostomy status: Secondary | ICD-10-CM

## 2014-05-13 LAB — BASIC METABOLIC PANEL
Anion gap: 9 (ref 5–15)
BUN: 61 mg/dL — ABNORMAL HIGH (ref 6–23)
CO2: 27 mmol/L (ref 19–32)
Calcium: 8.9 mg/dL (ref 8.4–10.5)
Chloride: 114 mEq/L — ABNORMAL HIGH (ref 96–112)
Creatinine, Ser: 1.43 mg/dL — ABNORMAL HIGH (ref 0.50–1.10)
GFR calc non Af Amer: 36 mL/min — ABNORMAL LOW (ref >90)
GFR, EST AFRICAN AMERICAN: 42 mL/min — AB (ref >90)
Glucose, Bld: 193 mg/dL — ABNORMAL HIGH (ref 70–99)
POTASSIUM: 3 mmol/L — AB (ref 3.5–5.1)
Sodium: 150 mmol/L — ABNORMAL HIGH (ref 135–145)

## 2014-05-13 LAB — GLUCOSE, CAPILLARY
GLUCOSE-CAPILLARY: 138 mg/dL — AB (ref 70–99)
GLUCOSE-CAPILLARY: 173 mg/dL — AB (ref 70–99)
GLUCOSE-CAPILLARY: 123 mg/dL — AB (ref 70–99)
Glucose-Capillary: 152 mg/dL — ABNORMAL HIGH (ref 70–99)
Glucose-Capillary: 144 mg/dL — ABNORMAL HIGH (ref 70–99)

## 2014-05-13 LAB — CBC
HEMATOCRIT: 27.7 % — AB (ref 36.0–46.0)
Hemoglobin: 8.8 g/dL — ABNORMAL LOW (ref 12.0–15.0)
MCH: 29.4 pg (ref 26.0–34.0)
MCHC: 31.8 g/dL (ref 30.0–36.0)
MCV: 92.6 fL (ref 78.0–100.0)
Platelets: 645 10*3/uL — ABNORMAL HIGH (ref 150–400)
RBC: 2.99 MIL/uL — AB (ref 3.87–5.11)
RDW: 17.4 % — ABNORMAL HIGH (ref 11.5–15.5)
WBC: 26.1 10*3/uL — ABNORMAL HIGH (ref 4.0–10.5)

## 2014-05-13 LAB — MAGNESIUM: MAGNESIUM: 2.2 mg/dL (ref 1.5–2.5)

## 2014-05-13 LAB — PHOSPHORUS: PHOSPHORUS: 2.9 mg/dL (ref 2.3–4.6)

## 2014-05-13 MED ORDER — POTASSIUM CHLORIDE 10 MEQ/50ML IV SOLN
10.0000 meq | INTRAVENOUS | Status: AC
  Administered 2014-05-13 (×4): 10 meq via INTRAVENOUS
  Filled 2014-05-13 (×4): qty 50

## 2014-05-13 NOTE — Plan of Care (Signed)
Problem: Acute Treatment Outcomes Goal: Airway maintained/protected Outcome: Completed/Met Date Met:  05/13/14 Tracheostomy in place

## 2014-05-13 NOTE — Progress Notes (Signed)
Upon entering patient's room, NGT visually out further than last seen, approximately at 25 cm mark. Tube feeds stopped, unable to auscultate. Dr. Beckie Saltsivet on unit and gave verbal order to reinsert a new Panda. Unsuccessful at reinserting the new Panda tube. Dr. Beckie Saltsivet aware and noted it will need to be replaced via IR tomorrow.

## 2014-05-13 NOTE — Evaluation (Signed)
[facility]y-[facility] Speaking Valve - Evaluation Patient Details  Name: [name] MRN: [id number] Date of Birth: 10/20/1945  Today's Date: 05/13/2014 Time: 1025-1046 SLP Time Calculation (min) (ACUTE ONLY): 21 min  Past Medical History:  Past Medical History  Diagnosis Date  . Thyroid disease   . Rhabdomyolysis 04/18/2014  . Hyperosmolar non-ketotic state in patient with type 2 diabetes mellitus 04/17/2014  . AKI (acute kidney injury) 04/18/2014  . Sepsis 04/18/2014  . Atrial fibrillation 04/27/2014  . Diabetes mellitus, type 2 04/27/2014   Past Surgical History:  Past Surgical History  Procedure Laterality Date  . Cholecystectomy     HPI:  [name] is an 69 y.o. female with a history of atrial fibrillation and recent hospitalization for sepsis, pneumonia rhabdomyolysis as well as hypernatremia and acute kidney injury, [facility] for increased confusion and speech abnormality. Serum sodium was 169. CT scan of her head showed low density areas involving large area of left MCA territory as well as right temporal abnormality, with appearance indicative of probable subacute infarctions. MRI was recommended but could not be obtained because of patient's agitated state. Pt was intubated 1/7-1/13 and required trach placement 1/15.   Assessment / Plan / Recommendation Clinical Impression  Pt tolerated cuff deflation with VS stable for approximately 15 minutes. Signs of decreased upper airway patency with PMSV placement include laborous exhalation and burst of air upon removal indicative of air trapping. Pt was able to wear the valve for 5-6 breath cycles prior to removal by SLP. SLP provided Mod cues for phonation, with brief sustained /a/ noted x1. Vocal quality was very low in intensity and hoarse. Given decreased PMSV tolerance and decreased oral acceptance, swallow evaluation was deferred at this time. Will continue to follow for PMSV and PO trials.     SLP Assessment  Patient needs continued Speech Lanaguage Pathology Services    Follow Up Recommendations  Skilled Nursing facility;24 hour supervision/assistance    Frequency and Duration min 2x/week  2 weeks   [facility]    SLP Goals Potential to Achieve Goals (ACUTE ONLY): Good Potential Considerations (ACUTE ONLY): Ability to learn/carryover information   PMSV Trial  PMSV was placed for: 5-6 breath cycles Able to redirect subglottic air through upper airway: Yes Able to Attain Phonation: Yes Voice Quality: Low vocal intensity;Hoarse (very low intensity x1) Able to Expectorate Secretions: No attempts Intelligibility: Unable to assess (comment) Respirations During Trial: 21 SpO2 During Trial: 98 % Pulse During Trial: 79 Behavior: Alert;Other (comment) (calm)   Tracheostomy Tube       Vent Dependency  FiO2 (%): 40 %    Cuff Deflation Trial Tolerated Cuff Deflation: Yes Length of Time for Cuff Deflation Trial: 15 minutes Behavior: Alert;Other (comment) (calm)     Maxcine HamLaura Paiewonsky, M.A. CCC-SLP [phone]  Maxcine Hamaiewonsky, Adaline Trejos 05/13/2014, 11:07 AM

## 2014-05-13 NOTE — Progress Notes (Signed)
PULMONARY / CRITICAL CARE MEDICINE   Name: [name] MRN: [id number] DOB: 1945-08-05    ADMISSION DATE:  04/27/2014 CONSULTATION DATE:  05/04/2014   REFERRING MD :  TRiad hospitalist  CHIEF COMPLAINT:  Acute encephalopathy, Sepsis, Circyulatory shock, Stroke, hypernatremia  HPI: Hx from Dr Roda Shutters of neuro 34 yof admitted initially 04/17/14 through 04/25/14  to  due to Sepsis in setting of CAP (NOS) RLL on CT, HONK, A Fib RVR, rhabdo (CPK 1700s) , AKI (creat 2.7), Lactic acidosis, demand NSTEMI . AT discharge time of dc to SNF had mild hypernatermia to 154. Re-admitted on 04/27/2014 - Na 162 and stroke L MCA with UTI. Na slowly corrected and then Na up due to NPO for TEE. Developed new fever since 05/03/14 and Na was again up to 166, BP 73/d, and worsening enceph and obutndation. PCCM taking over primary 04/27/2014  SIGNIFICANT EVENTS: 04/27/2014 - admit 05/03/14 - ICU transfer 05/04/14 - PCCM primary. Intubation  VITAL SIGNS: Temp:  [97.9 F (36.6 C)-99.3 F (37.4 C)] 98.9 F (37.2 C) (01/16 0800) Pulse Rate:  [57-95] 85 (01/16 0855) Resp:  [16-32] 19 (01/16 0855) BP: (93-163)/(41-106) 140/58 mmHg (01/16 0800) SpO2:  [91 %-100 %] 99 % (01/16 0855) FiO2 (%):  [40 %-100 %] 40 % (01/16 0855) Weight:  [106 kg (233 lb 11 oz)] 106 kg (233 lb 11 oz) (01/16 0600)   HEMODYNAMICS:   VENTILATOR SETTINGS: Vent Mode:  [-] PRVC FiO2 (%):  [40 %-100 %] 40 % Set Rate:  [16 bmp] 16 bmp Vt Set:  [440 mL] 440 mL PEEP:  [5 cmH20] 5 cmH20 Plateau Pressure:  [13 cmH20-23 cmH20] 13 cmH20 INTAKE / OUTPUT:  Intake/Output Summary (Last 24 hours) at 05/13/14 0859 Last data filed at 05/13/14 0600  Gross per 24 hour  Intake    945 ml  Output   3380 ml  Net  -2435 ml   PHYSICAL EXAMINATION: General:  Obese, critically ill looking, follows some commands.  Trached. Neuro:  Localizes and follows commands on all 4 ext but very delayed. HEENT:  Trach in place, PERRL, EOM-I and +BS. Cardiovascular:   Reg irreg with stable HR. Lungs: Scattered rhonchi. Abdomen: Obese, Soft, ND, NT and +BS. Musculoskeletal:  Scattered skin bruises, RT Sided PICC + Skin:  Intact anteriorly  LABS: PULMONARY  Recent Labs Lab 05/09/14 0404 05/10/14 0354 05/11/14 0340 05/12/14 0500 05/12/14 1830  PHART 7.434 7.406 7.399 7.440 7.336*  PCO2ART 31.7* 34.7* 31.6* 34.0* 42.0  PO2ART 91.9 121.0* 118.0* 92.8 233.0*  HCO3 20.9 21.3 19.1* 22.7 21.8  TCO2 21.9 22.4 20.1 23.7 23.1  O2SAT 96.9 98.2 98.5 97.3 99.4   CBC  Recent Labs Lab 05/11/14 0615 05/12/14 0554 05/13/14 0520  HGB 8.5* 8.8* 8.8*  HCT 25.6* 27.0* 27.7*  WBC 19.0* 24.7* 26.1*  PLT 494* 591* 645*   COAGULATION No results for input(s): INR in the last 168 hours. CARDIAC No results for input(s): TROPONINI in the last 168 hours. No results for input(s): PROBNP in the last 168 hours.  CHEMISTRY  Recent Labs Lab 05/09/14 0459 05/10/14 0510 05/11/14 0615 05/12/14 0554 05/13/14 0520  NA 144 143 147* 146* 150*  K 2.0* <2.0* 2.6* 2.7* 3.0*  CL 115* 110 110 116* 114*  CO2 GLUCOSE 282* 228* 215* 180* 193*  BUN 63* 68* 65* 61* 61*  CREATININE 1.77* 1.76* 1.66* 1.32* 1.43*  CALCIUM 8.1* 8.3* 8.9 8.7 8.9  MG 1.5 2.1 1.9 2.3 2.2  PHOS 4.0 3.4 2.4 2.2* 2.9   Estimated Creatinine Clearance: 44.1 mL/min (by C-G formula based on Cr of 1.43).  LIVER  Recent Labs Lab 05/07/14 0530  AST 57*  ALT 24  ALKPHOS 545*  BILITOT 0.3  PROT 5.6*  ALBUMIN 1.5*   INFECTIOUS No results for input(s): LATICACIDVEN, PROCALCITON in the last 168 hours. ENDOCRINE CBG (last 3)   Recent Labs  05/12/14 2337 05/13/14 0328 05/13/14 0803  GLUCAP 231* 138* 173*   IMAGING x48h Mr Brain Wo Contrast  05/11/2014   CLINICAL DATA:  69 year old female who presented with sepsis in late December. Subsequent non ST elevation myocardial infarction. Altered mental status on 04/27/2014 with detection of left hemisphere edema on CT.  Suspected left MCA infarct. Subsequent encounter.  Renal insufficiency (GFR 35) such that IV contrast administration was deferred at this time.  EXAM: MRI HEAD WITHOUT CONTRAST  TECHNIQUE: Multiplanar, multiecho pulse sequences of the brain and surrounding structures were obtained without intravenous contrast.  COMPARISON:  Head CTs without contrast 05/02/2014 and earlier.  FINDINGS: Study is intermittently degraded by motion artifact despite repeated imaging attempts.  Confluent increased trace diffusion signal in the posterior left hemisphere affecting the left temporal and occipital lobes primarily, and corresponding to be abnormal hypodensity seen previously on CT. Associated T2 and FLAIR hyperintensity. On susceptibility weighted imaging there is gyriform signal loss compatible with petechial hemorrhage throughout much of the area. Intrinsic increased gyriform T1 signal (series 12) could represent blood products and/or laminar necrosis.  No other intracranial hemorrhage identified. Mild mass effect on the left lateral ventricle has regressed and nearly resolved since 04/27/2014.  No contralateral or posterior fossa restricted diffusion. Major intracranial vascular flow voids are preserved, fetal type bilateral PCA origins are evident.  Elsewhere gray and white matter signal is within normal limits for age. No midline shift, mass effect, or evidence of intracranial mass lesion. No ventriculomegaly. Patent basilar cisterns. Negative pituitary, cervicomedullary junction and visualized cervical spine.  Visualized orbit soft tissues are within normal limits. Mild mastoid effusions. Fluid in the nasopharynx. Right side nasoenteric tube in place. Paranasal sinuses are clear. Endotracheal tube partially visible. Visualized scalp soft tissues are within normal limits. Visualized bone marrow signal is within normal limits.  IMPRESSION: 1. Appearance on this series of Head CTs in conjunction with current signal  abnormality in the left hemisphere most compatible with subacute posterior left MCA/PCA infarct with petechial hemorrhage and laminar necrosis. Fetal type left PCA origin also noted. 2. No new intracranial abnormality identified, and elsewhere unremarkable for age MRI non contrast appearance of the brain. 3. Intubated.  Mild mastoid effusions.   Electronically Signed   By: Augusto Gamble M.D.   On: 05/11/2014 12:41   Dg Chest Port 1 View  05/12/2014   CLINICAL DATA:  Chronic ventilator dependent respiratory failure. Tracheostomy tube placement.  EXAM: PORTABLE CHEST - 1 VIEW 1531 hr:  COMPARISON:  Portable chest x-ray earlier same date 0528 hr and dating back to 05/04/2014.  FINDINGS: New tracheostomy tube tip in satisfactory position below the thoracic inlet approximately 4 cm above the carina. No evidence of pneumomediastinum. Right arm PICC tip projects over the mid SVC, unchanged. Feeding tube courses below the diaphragm into the stomach but its tip is not included.  Interval development of dense right lower lobe atelectasis since earlier in the day, silhouetting note right hemidiaphragm. Lungs otherwise clear. Pulmonary vascularity normal. Cardiac silhouette moderately enlarged but stable.  IMPRESSION: 1. New tracheostomy tube tip in satisfactory  position approximately 4 cm above the carina. No complicating features. 2. Remaining support apparatus satisfactory. 3. New dense right lower lobe atelectasis since earlier in the day. Lungs remain clear otherwise.   Electronically Signed   By: Hulan Saashomas  Lawrence M.D.   On: 05/12/2014 15:48   Dg Chest Port 1 View  05/12/2014   CLINICAL DATA:  Respiratory failure.  EXAM: PORTABLE CHEST - 1 VIEW  COMPARISON:  05/11/2014.  FINDINGS: Interim extubation. Feeding tube and right PICC line in stable position. Cardiomegaly with normal pulmonary vascularity. Low lung volumes with bibasilar atelectasis and/or infiltrates. No pneumothorax. Small left pleural effusion cannot be  excluded.  IMPRESSION: 1. Interim extubation. Feeding tube and right PICC line in stable position. 2. Low lung volumes with mild bibasilar atelectasis and/or infiltrates. Small left pleural effusion cannot be excluded.   Electronically Signed   By: Maisie Fushomas  Register   On: 05/12/2014 07:58   ASSESSMENT / PLAN:  PULMONARY OETT 05/04/2014>>>1/13 Janina Mayorach Ninetta Lights(JY) 1/15>>> A:  Acute resp failure to encephalopathy and possible HCAP vs aspiration  Stable, tolerating PSV, but MS not ready for extubation. CXR improved  P:   Trached 1/15 for lack of airway protection, advance to TC as able. See ID  Titrate O2 for sat of 88-92%.  CARDIOVASCULAR CVLRt UE picc A:  Circulatory shock - ? Due to dehydration fron high Na v sepsis v both > pressors off P:  Continue stress dose steroids to 25 mg IV q6 hours given marginal BP. Hold further diureses for today. D/C pressors.  RENAL  A:   Hypernatremia, improving Acute Renal Failure-->slightly improved  Hypokalemia  P:   Continue free water to 200 ml q8. Replace electrolytes as indicated. D/Ced D5W. MAP goal > 65 mmHg. F/u chem in am. D/C lasix.  GASTROINTESTINAL A:   Mental status precludes diet P:   Tube feeds per nutrition once able to get panda back in. PPI.  HEMATOLOGIC A:   Anemia of critical illness P:  PRBC for hgb < 7gm%. Lovenox for DVT proph.  INFECTIOUS 12.31.16 - MRSA PCR - neg 1./1 - blodo culture - negative 04/30/14 - urine culture - e coli - sensit to ceftriaxone 05/03/14 - blood negative 05/03/14 - MRSA PCR - negative 1/7 c diff: neg  A:   UTI septic shock +/- HCAP P:   Vanc 1/9>>>1/15 Cefepime 1/9>>>1/15  ENDOCRINE A:   DM Relative adrenal insuff  P:   ICU hyperglycemia protocol. Cont stress dose steroids.  NEUROLOGIC A:   Comatose due to hypernatremia. Subacute CVA  P:   PRN fentanyl for pain. Neuro helping. MRI noted, left post CVA. No need for TEE.  FAMILY  - Updates:  No family bedside.  The  patient is critically ill with multiple organ systems failure and requires high complexity decision making for assessment and support, frequent evaluation and titration of therapies, application of advanced monitoring technologies and extensive interpretation of multiple databases.   Critical Care Time devoted to patient care services described in this note is  35  Minutes. This time reflects time of care of this signee Dr Koren BoundWesam Yacoub. This critical care time does not reflect procedure time, or teaching time or supervisory time of PA/NP/Med student/Med Resident etc but could involve care discussion time.  Alyson ReedyWesam G. Yacoub, M.D. [facility] Pulmonary/Critical Care Medicine. Pager: [phone]. After hours pager: [phone].

## 2014-05-14 ENCOUNTER — Inpatient Hospital Stay (HOSPITAL_COMMUNITY): Payer: Medicare Other

## 2014-05-14 DIAGNOSIS — R1314 Dysphagia, pharyngoesophageal phase: Secondary | ICD-10-CM | POA: Insufficient documentation

## 2014-05-14 LAB — BASIC METABOLIC PANEL
Anion gap: 3 — ABNORMAL LOW (ref 5–15)
BUN: 51 mg/dL — ABNORMAL HIGH (ref 6–23)
CO2: 31 mmol/L (ref 19–32)
CREATININE: 1.35 mg/dL — AB (ref 0.50–1.10)
Calcium: 9.2 mg/dL (ref 8.4–10.5)
Chloride: 122 mEq/L — ABNORMAL HIGH (ref 96–112)
GFR, EST AFRICAN AMERICAN: 45 mL/min — AB (ref >90)
GFR, EST NON AFRICAN AMERICAN: 39 mL/min — AB (ref >90)
Glucose, Bld: 113 mg/dL — ABNORMAL HIGH (ref 70–99)
Potassium: 3.2 mmol/L — ABNORMAL LOW (ref 3.5–5.1)
Sodium: 156 mmol/L — ABNORMAL HIGH (ref 135–145)

## 2014-05-14 LAB — GLUCOSE, CAPILLARY
GLUCOSE-CAPILLARY: 146 mg/dL — AB (ref 70–99)
GLUCOSE-CAPILLARY: 158 mg/dL — AB (ref 70–99)
Glucose-Capillary: 133 mg/dL — ABNORMAL HIGH (ref 70–99)
Glucose-Capillary: 137 mg/dL — ABNORMAL HIGH (ref 70–99)
Glucose-Capillary: 152 mg/dL — ABNORMAL HIGH (ref 70–99)
Glucose-Capillary: 93 mg/dL (ref 70–99)

## 2014-05-14 LAB — PHOSPHORUS: PHOSPHORUS: 2.9 mg/dL (ref 2.3–4.6)

## 2014-05-14 LAB — CBC
HCT: 26.1 % — ABNORMAL LOW (ref 36.0–46.0)
Hemoglobin: 8.3 g/dL — ABNORMAL LOW (ref 12.0–15.0)
MCH: 29.4 pg (ref 26.0–34.0)
MCHC: 31.8 g/dL (ref 30.0–36.0)
MCV: 92.6 fL (ref 78.0–100.0)
Platelets: 612 10*3/uL — ABNORMAL HIGH (ref 150–400)
RBC: 2.82 MIL/uL — ABNORMAL LOW (ref 3.87–5.11)
RDW: 18.2 % — ABNORMAL HIGH (ref 11.5–15.5)
WBC: 27.5 10*3/uL — AB (ref 4.0–10.5)

## 2014-05-14 LAB — MAGNESIUM: Magnesium: 2.3 mg/dL (ref 1.5–2.5)

## 2014-05-14 MED ORDER — FUROSEMIDE 10 MG/ML IJ SOLN
40.0000 mg | Freq: Four times a day (QID) | INTRAMUSCULAR | Status: AC
  Administered 2014-05-14 (×3): 40 mg via INTRAVENOUS
  Filled 2014-05-14 (×3): qty 4

## 2014-05-14 MED ORDER — PREDNISONE 5 MG/ML PO CONC
20.0000 mg | Freq: Every day | ORAL | Status: AC
  Administered 2014-05-14 – 2014-05-15 (×2): 20 mg
  Filled 2014-05-14 (×3): qty 4

## 2014-05-14 MED ORDER — POTASSIUM CHLORIDE CRYS ER 20 MEQ PO TBCR
40.0000 meq | EXTENDED_RELEASE_TABLET | Freq: Three times a day (TID) | ORAL | Status: AC
  Administered 2014-05-14 (×2): 40 meq via ORAL
  Filled 2014-05-14 (×2): qty 2

## 2014-05-14 MED ORDER — FREE WATER
300.0000 mL | Status: DC
  Administered 2014-05-14 – 2014-05-17 (×15): 300 mL

## 2014-05-14 MED ORDER — POTASSIUM CHLORIDE 10 MEQ/50ML IV SOLN
10.0000 meq | INTRAVENOUS | Status: AC
  Administered 2014-05-14 (×5): 10 meq via INTRAVENOUS
  Filled 2014-05-14 (×3): qty 50

## 2014-05-14 NOTE — Progress Notes (Signed)
[facility] FOR AM LAB REPLACEMENT ONLY  The patient does not apply for the [facility] Electrolyte Replacment Protocol based on the criteria listed below:    Is GFR >/= 40 ml/min? No.  Patient's GFR today is 39   Abnormal electrolyte(s): K3.2   If a panic level lab has been reported, has the CCM MD in charge been notified? No. Physician:  E Deterding,MD  Melrose NakayamaChisholm, Liley Rake William 05/14/2014 5:17 AM

## 2014-05-14 NOTE — Progress Notes (Signed)
Pt placed on vent per order, mandatory rest at night.

## 2014-05-14 NOTE — Progress Notes (Signed)
PULMONARY / CRITICAL CARE MEDICINE   Name: [name] MRN: [id number] DOB: 12/20/1945    ADMISSION DATE:  04/27/2014 CONSULTATION DATE:  05/04/2014   REFERRING MD :  TRiad hospitalist  CHIEF COMPLAINT:  Acute encephalopathy, Sepsis, Circyulatory shock, Stroke, hypernatremia  HPI: Hx from Dr Roda ShuttersXu of neuro [age] yof admitted initially 04/17/14 through 04/25/14  to [place] due to Sepsis in setting of CAP (NOS) RLL on CT, HONK, A Fib RVR, rhabdo (CPK 1700s) , AKI (creat 2.7), Lactic acidosis, demand NSTEMI . AT discharge time of dc to SNF had mild hypernatermia to 154. Re-admitted on 04/27/2014 - Na 162 and stroke L MCA with UTI. Na slowly corrected and then Na up due to NPO for TEE. Developed new fever since 05/03/14 and Na was again up to 166, BP 73/d, and worsening enceph and obutndation. PCCM taking over primary 04/27/2014  SIGNIFICANT EVENTS: 04/27/2014 - admit 05/03/14 - ICU transfer 05/04/14 - PCCM primary. Intubation 1/15 Trach  VITAL SIGNS: Temp:  [98.1 F (36.7 C)-99.3 F (37.4 C)] 98.3 F (36.8 C) (01/17 0800) Pulse Rate:  [66-86] 79 (01/17 1000) Resp:  [12-25] 12 (01/17 1000) BP: (101-164)/(41-89) 139/68 mmHg (01/17 1000) SpO2:  [94 %-100 %] 100 % (01/17 1000) FiO2 (%):  [35 %-40 %] 35 % (01/17 0915) Weight:  [104.4 kg (230 lb 2.6 oz)] 104.4 kg (230 lb 2.6 oz) (01/17 0400)   HEMODYNAMICS:   VENTILATOR SETTINGS: Vent Mode:  [-] PRVC FiO2 (%):  [35 %-40 %] 35 % Set Rate:  [16 bmp] 16 bmp Vt Set:  [440 mL] 440 mL PEEP:  [5 cmH20] 5 cmH20 Plateau Pressure:  [13 cmH20] 13 cmH20  INTAKE / OUTPUT:  Intake/Output Summary (Last 24 hours) at 05/14/14 1040 Last data filed at 05/14/14 0850  Gross per 24 hour  Intake    400 ml  Output   1475 ml  Net  -1075 ml   PHYSICAL EXAMINATION: General:  Obese, critically ill looking, follows some commands.  Trached. Neuro:  Localizes and follows commands on all 4 ext but very delayed. HEENT:  Trach in place, PERRL, EOM-I and  +BS. Cardiovascular:  Reg irreg with stable HR. Lungs: Scattered rhonchi. Abdomen: Obese, Soft, ND, NT and +BS. Musculoskeletal:  Scattered skin bruises, RT Sided PICC + Skin:  Intact anteriorly  LABS: PULMONARY  Recent Labs Lab 05/09/14 0404 05/10/14 0354 05/11/14 0340 05/12/14 0500 05/12/14 1830  PHART 7.434 7.406 7.399 7.440 7.336*  PCO2ART 31.7* 34.7* 31.6* 34.0* 42.0  PO2ART 91.9 121.0* 118.0* 92.8 233.0*  HCO3 20.9 21.3 19.1* 22.7 21.8  TCO2 21.9 22.4 20.1 23.7 23.1  O2SAT 96.9 98.2 98.5 97.3 99.4   CBC  Recent Labs Lab 05/12/14 0554 05/13/14 0520 05/14/14 0305  HGB 8.8* 8.8* 8.3*  HCT 27.0* 27.7* 26.1*  WBC 24.7* 26.1* 27.5*  PLT 591* 645* 612*   COAGULATION No results for input(s): INR in the last 168 hours. CARDIAC No results for input(s): TROPONINI in the last 168 hours. No results for input(s): PROBNP in the last 168 hours.  CHEMISTRY  Recent Labs Lab 05/10/14 0510 05/11/14 0615 05/12/14 0554 05/13/14 0520 05/14/14 0305  NA 143 147* 146* 150* 156*  K <2.0* 2.6* 2.7* 3.0* 3.2*  CL 110 110 116* 114* 122*  CO2 24 21 26 27 31   GLUCOSE 228* 215* 180* 193* 113*  BUN 68* 65* 61* 61* 51*  CREATININE 1.76* 1.66* 1.32* 1.43* 1.35*  CALCIUM 8.3* 8.9 8.7 8.9 9.2  MG 2.1 1.9 2.3 2.2  2.3  PHOS 3.4 2.4 2.2* 2.9 2.9   Estimated Creatinine Clearance: 46.3 mL/min (by C-G formula based on Cr of 1.35).  LIVER No results for input(s): AST, ALT, ALKPHOS, BILITOT, PROT, ALBUMIN, INR in the last 168 hours. INFECTIOUS No results for input(s): LATICACIDVEN, PROCALCITON in the last 168 hours. ENDOCRINE CBG (last 3)   Recent Labs  05/13/14 2330 05/14/14 0342 05/14/14 0836  GLUCAP 133* 93 152*   IMAGING x48h Dg Chest Port 1 View  05/12/2014   CLINICAL DATA:  Chronic ventilator dependent respiratory failure. Tracheostomy tube placement.  EXAM: PORTABLE CHEST - 1 VIEW 1531 hr:  COMPARISON:  Portable chest x-ray earlier same date 0528 hr and dating back to  05/04/2014.  FINDINGS: New tracheostomy tube tip in satisfactory position below the thoracic inlet approximately 4 cm above the carina. No evidence of pneumomediastinum. Right arm PICC tip projects over the mid SVC, unchanged. Feeding tube courses below the diaphragm into the stomach but its tip is not included.  Interval development of dense right lower lobe atelectasis since earlier in the day, silhouetting note right hemidiaphragm. Lungs otherwise clear. Pulmonary vascularity normal. Cardiac silhouette moderately enlarged but stable.  IMPRESSION: 1. New tracheostomy tube tip in satisfactory position approximately 4 cm above the carina. No complicating features. 2. Remaining support apparatus satisfactory. 3. New dense right lower lobe atelectasis since earlier in the day. Lungs remain clear otherwise.   Electronically Signed   By: Hulan Saas M.D.   On: 05/12/2014 15:48   ASSESSMENT / PLAN:  PULMONARY OETT 05/04/2014>>>1/13 Trach Ninetta Lights) 1/15>>> A:  Acute resp failure to encephalopathy and possible HCAP vs aspiration  Stable, tolerating PSV, but MS not ready for extubation. CXR improved  P:   Trached 1/15 for lack of airway protection, advance to TC as able, needed vent due to respiratory distress overnight. See ID  Titrate O2 for sat of 88-92%.  CARDIOVASCULAR CVLRt UE picc A:  Circulatory shock - ? Due to dehydration fron high Na v sepsis v both > pressors off P:  Change stress dose steroids to prednisone 20 mg PO daily x3 days then 10 mg PO daily x3 days then 5 mg PO daily x3 days then D/C. Diureses. D/C pressors.  RENAL  A:   Hypernatremia, improving Acute Renal Failure-->slightly improved  Hypokalemia  Hypernatremia P:   Increase free water to 300 ml q4. Replace electrolytes as indicated. D/Ced D5W. MAP goal > 65 mmHg. F/u chem in am. Lasix 40 mg IV q6 x3 doses.  GASTROINTESTINAL A:   Mental status precludes diet P:   Tube feeds since I inserted  NGT. PPI.  HEMATOLOGIC A:   Anemia of critical illness P:  PRBC for hgb < 7gm%. Lovenox for DVT proph.  INFECTIOUS 12.31.16 - MRSA PCR - neg 1./1 - blodo culture - negative 04/30/14 - urine culture - e coli - sensit to ceftriaxone 05/03/14 - blood negative 05/03/14 - MRSA PCR - negative 1/7 c diff: neg  A:   UTI septic shock +/- HCAP P:   Vanc 1/9>>>1/15 Cefepime 1/9>>>1/15  ENDOCRINE A:   DM Relative adrenal insuff  P:   ICU hyperglycemia protocol. Stress doses steroid taper as above.  NEUROLOGIC A:   Comatose due to hypernatremia. Subacute CVA  P:   PRN fentanyl for pain. Neuro helping. MRI noted, left post CVA. No need for TEE.  FAMILY  - Updates:  No family bedside.  The patient is critically ill with multiple organ systems failure and requires high  complexity decision making for assessment and support, frequent evaluation and titration of therapies, application of advanced monitoring technologies and extensive interpretation of multiple databases.   Critical Care Time devoted to patient care services described in this note is  35  Minutes. This time reflects time of care of this signee Dr Jennet Maduro. This critical care time does not reflect procedure time, or teaching time or supervisory time of PA/NP/Med student/Med Resident etc but could involve care discussion time.  Rush Farmer, M.D. [facility] Pulmonary/Critical Care Medicine. Pager: [phone]. After hours pager: [phone].

## 2014-05-14 NOTE — Progress Notes (Signed)
eLink Physician-Brief Progress Note Patient Name: [name] DOB: 09/16/1945 MRN: [id number]   Date of Service  05/14/2014  HPI/Events of Note  Hypokalemia  eICU Interventions  Potassium replaced     Intervention Category Intermediate Interventions: Electrolyte abnormality - evaluation and management  DETERDING,ELIZABETH 05/14/2014, 4:39 AM

## 2014-05-14 NOTE — Procedures (Signed)
NGT Placement  Patient positioned, panda lubricated, inserted in right nare, advanced without difficulty, ausculted over the stomach.  AXR ordered and pending.  Alyson ReedyWesam G. Yacoub, M.D. [facility] Pulmonary/Critical Care Medicine. Pager: [phone]. After hours pager: [phone].

## 2014-05-14 NOTE — Social Work (Signed)
Clinical Social Work Department BRIEF PSYCHOSOCIAL ASSESSMENT 05/14/2014  Patient:  [name], [name]     Account Number:  [id number]     Admit date:  04/27/2014  Clinical Social Worker:  [name]  Date/Time:  05/14/2014 03:18 PM  Referred by:  Physician  Date Referred:  05/12/2014 Referred for  Psychosocial assessment   Other Referral:   Interview type:  Family Other interview type:   Met with patient's husband.    PSYCHOSOCIAL DATA Living Status:  FACILITY Admitted from facility:  [facility] Level of care:  [place] Primary support name:  [name] Primary support relationship to patient:  SPOUSE Degree of support available:   Patient was doin very well prior to admission to SNF. Patient had only been in SNF for 1 day and currently spouse deisres another SNF placement.    CURRENT CONCERNS Current Concerns  Post-Acute Placement   Other Concerns:    SOCIAL WORK ASSESSMENT / PLAN CSW met with husband in order to assess. Husband states that they have been living together prior to her falling ill.  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  CSW offered him some additional suppot but he declined, stating that he wanted the patient to have her needs met.  CSW asked about placement options if necessary and patient's husband stated that he did not want patient placed there again, he would be open to placement in another facility   Assessment/plan status:  No Further Intervention Required Other assessment/ plan:   Information/referral to community resources:    PATIENT'S/FAMILY'S RESPONSE TO PLAN OF CARE: Patient is currently on vent. Once patient needs are further assessed then a decision can be made about placement. Patient's husband states that he does not desire for patient to return to [facility].   [name] MSW, [place]

## 2014-05-15 LAB — BASIC METABOLIC PANEL
Anion gap: 3 — ABNORMAL LOW (ref 5–15)
BUN: 52 mg/dL — AB (ref 6–23)
CHLORIDE: 117 meq/L — AB (ref 96–112)
CO2: 31 mmol/L (ref 19–32)
Calcium: 8.9 mg/dL (ref 8.4–10.5)
Creatinine, Ser: 1.36 mg/dL — ABNORMAL HIGH (ref 0.50–1.10)
GFR calc Af Amer: 45 mL/min — ABNORMAL LOW (ref >90)
GFR, EST NON AFRICAN AMERICAN: 39 mL/min — AB (ref >90)
GLUCOSE: 159 mg/dL — AB (ref 70–99)
Potassium: 3.3 mmol/L — ABNORMAL LOW (ref 3.5–5.1)
SODIUM: 151 mmol/L — AB (ref 135–145)

## 2014-05-15 LAB — GLUCOSE, CAPILLARY
Glucose-Capillary: 117 mg/dL — ABNORMAL HIGH (ref 70–99)
Glucose-Capillary: 206 mg/dL — ABNORMAL HIGH (ref 70–99)
Glucose-Capillary: 195 mg/dL — ABNORMAL HIGH (ref 70–99)
Glucose-Capillary: 234 mg/dL — ABNORMAL HIGH (ref 70–99)
Glucose-Capillary: 322 mg/dL — ABNORMAL HIGH (ref 70–99)
Glucose-Capillary: 102 mg/dL — ABNORMAL HIGH (ref 70–99)

## 2014-05-15 LAB — CBC
HCT: 26.1 % — ABNORMAL LOW (ref 36.0–46.0)
HEMOGLOBIN: 8.1 g/dL — AB (ref 12.0–15.0)
MCH: 30.3 pg (ref 26.0–34.0)
MCHC: 31 g/dL (ref 30.0–36.0)
MCV: 97.8 fL (ref 78.0–100.0)
Platelets: 516 10*3/uL — ABNORMAL HIGH (ref 150–400)
RBC: 2.67 MIL/uL — ABNORMAL LOW (ref 3.87–5.11)
RDW: 19.1 % — AB (ref 11.5–15.5)
WBC: 26.3 10*3/uL — ABNORMAL HIGH (ref 4.0–10.5)

## 2014-05-15 LAB — PHOSPHORUS: PHOSPHORUS: 3.3 mg/dL (ref 2.3–4.6)

## 2014-05-15 LAB — MAGNESIUM: MAGNESIUM: 2.1 mg/dL (ref 1.5–2.5)

## 2014-05-15 MED ORDER — POTASSIUM CHLORIDE 20 MEQ/15ML (10%) PO SOLN
40.0000 meq | Freq: Three times a day (TID) | ORAL | Status: AC
  Administered 2014-05-15 (×2): 40 meq
  Filled 2014-05-15 (×3): qty 30

## 2014-05-15 MED ORDER — FUROSEMIDE 10 MG/ML IJ SOLN
40.0000 mg | Freq: Four times a day (QID) | INTRAMUSCULAR | Status: AC
  Administered 2014-05-15 – 2014-05-16 (×3): 40 mg via INTRAVENOUS
  Filled 2014-05-15 (×3): qty 4

## 2014-05-15 MED ORDER — CEFAZOLIN SODIUM-DEXTROSE 2-3 GM-% IV SOLR
2.0000 g | Freq: Once | INTRAVENOUS | Status: DC
  Filled 2014-05-15: qty 50

## 2014-05-15 NOTE — Consult Note (Signed)
Reason for Consult:PEG placement Referring Physician: [name] is an 69 y.o. female.  HPI: The patient had a recent MCA stroke and significant neurologic disability secondary to acute on chronic hyponatremia. Although she is trached, she cannot swallow safely or tolerate a PM valve. The request has been made for the patient to get a percutaneous endoscopic gastrostomy tube.  Past Medical History  Diagnosis Date  . Thyroid disease   . Rhabdomyolysis 04/18/2014  . Hyperosmolar non-ketotic state in patient with type 2 diabetes mellitus 04/17/2014  . AKI (acute kidney injury) 04/18/2014  . Sepsis 04/18/2014  . Atrial fibrillation 04/27/2014  . Diabetes mellitus, type 2 04/27/2014    Past Surgical History  Procedure Laterality Date  . Cholecystectomy      Family History  Problem Relation Age of Onset  . Osteoarthritis Mother   . Cancer Mother   . Diabetes Mother   . Heart failure Mother   . Hypertension Mother   . Osteoarthritis Father   . Cancer Father   . Osteoarthritis Sister   . Cancer Sister   . Osteoarthritis Brother   . Cancer Brother     Social History:  reports that she has never smoked. She does not have any smokeless tobacco history on file. She reports that she does not drink alcohol or use illicit drugs.  Allergies:  Allergies  Allergen Reactions  . Morphine And Related     Medications: I have reviewed the patient's current medications.  Results for orders placed or performed during the hospital encounter of 04/27/14 (from the past 48 hour(s))  Glucose, capillary     Status: Abnormal   Collection Time: 05/13/14  8:08 PM  Result Value Ref Range   Glucose-Capillary 144 (H) 70 - 99 mg/dL   Comment 1 Notify RN    Comment 2 Documented in Chart   Glucose, capillary     Status: Abnormal   Collection Time: 05/13/14 11:30 PM  Result Value Ref Range   Glucose-Capillary 133 (H) 70 - 99 mg/dL   Comment 1 Notify RN    Comment 2 Documented in Chart     CBC     Status: Abnormal   Collection Time: 05/14/14  3:05 AM  Result Value Ref Range   WBC 27.5 (H) 4.0 - 10.5 K/uL   RBC 2.82 (L) 3.87 - 5.11 MIL/uL   Hemoglobin 8.3 (L) 12.0 - 15.0 g/dL   HCT 26.1 (L) 36.0 - 46.0 %   MCV 92.6 78.0 - 100.0 fL   MCH 29.4 26.0 - 34.0 pg   MCHC 31.8 30.0 - 36.0 g/dL   RDW 18.2 (H) 11.5 - 15.5 %   Platelets 612 (H) 150 - 400 K/uL  Basic metabolic panel     Status: Abnormal   Collection Time: 05/14/14  3:05 AM  Result Value Ref Range   Sodium 156 (H) 135 - 145 mmol/L    Comment: Please note change in reference range.   Potassium 3.2 (L) 3.5 - 5.1 mmol/L    Comment: Please note change in reference range.   Chloride 122 (H) 96 - 112 mEq/L   CO2 31 19 - 32 mmol/L   Glucose, Bld 113 (H) 70 - 99 mg/dL   BUN 51 (H) 6 - 23 mg/dL   Creatinine, Ser 1.35 (H) 0.50 - 1.10 mg/dL   Calcium 9.2 8.4 - 10.5 mg/dL   GFR calc non Af Amer 39 (L) >90 mL/min   GFR calc Af Amer 45 (L) >90  mL/min    Comment: (NOTE) The eGFR has been calculated using the CKD EPI equation. This calculation has not been validated in all clinical situations. eGFR's persistently <90 mL/min signify possible Chronic Kidney Disease.    Anion gap 3 (L) 5 - 15  Magnesium     Status: None   Collection Time: 05/14/14  3:05 AM  Result Value Ref Range   Magnesium 2.3 1.5 - 2.5 mg/dL  Phosphorus     Status: None   Collection Time: 05/14/14  3:05 AM  Result Value Ref Range   Phosphorus 2.9 2.3 - 4.6 mg/dL  Glucose, capillary     Status: None   Collection Time: 05/14/14  3:42 AM  Result Value Ref Range   Glucose-Capillary 93 70 - 99 mg/dL  Glucose, capillary     Status: Abnormal   Collection Time: 05/14/14  8:36 AM  Result Value Ref Range   Glucose-Capillary 152 (H) 70 - 99 mg/dL  Glucose, capillary     Status: Abnormal   Collection Time: 05/14/14 12:27 PM  Result Value Ref Range   Glucose-Capillary 146 (H) 70 - 99 mg/dL  Glucose, capillary     Status: Abnormal   Collection Time:  05/14/14  3:52 PM  Result Value Ref Range   Glucose-Capillary 137 (H) 70 - 99 mg/dL  Glucose, capillary     Status: Abnormal   Collection Time: 05/14/14  7:40 PM  Result Value Ref Range   Glucose-Capillary 158 (H) 70 - 99 mg/dL   Comment 1 Notify RN    Comment 2 Documented in Chart   Glucose, capillary     Status: Abnormal   Collection Time: 05/14/14 11:42 PM  Result Value Ref Range   Glucose-Capillary 206 (H) 70 - 99 mg/dL  Glucose, capillary     Status: Abnormal   Collection Time: 05/15/14  3:28 AM  Result Value Ref Range   Glucose-Capillary 117 (H) 70 - 99 mg/dL   Comment 1 Notify RN    Comment 2 Documented in Chart   CBC     Status: Abnormal   Collection Time: 05/15/14  5:30 AM  Result Value Ref Range   WBC 26.3 (H) 4.0 - 10.5 K/uL   RBC 2.67 (L) 3.87 - 5.11 MIL/uL   Hemoglobin 8.1 (L) 12.0 - 15.0 g/dL   HCT 26.1 (L) 36.0 - 46.0 %   MCV 97.8 78.0 - 100.0 fL   MCH 30.3 26.0 - 34.0 pg   MCHC 31.0 30.0 - 36.0 g/dL   RDW 19.1 (H) 11.5 - 15.5 %   Platelets 516 (H) 150 - 400 K/uL  Basic metabolic panel     Status: Abnormal   Collection Time: 05/15/14  5:30 AM  Result Value Ref Range   Sodium 151 (H) 135 - 145 mmol/L    Comment: Please note change in reference range.   Potassium 3.3 (L) 3.5 - 5.1 mmol/L    Comment: Please note change in reference range.   Chloride 117 (H) 96 - 112 mEq/L   CO2 31 19 - 32 mmol/L   Glucose, Bld 159 (H) 70 - 99 mg/dL   BUN 52 (H) 6 - 23 mg/dL   Creatinine, Ser 1.36 (H) 0.50 - 1.10 mg/dL   Calcium 8.9 8.4 - 10.5 mg/dL   GFR calc non Af Amer 39 (L) >90 mL/min   GFR calc Af Amer 45 (L) >90 mL/min    Comment: (NOTE) The eGFR has been calculated using the CKD EPI equation. This calculation  has not been validated in all clinical situations. eGFR's persistently <90 mL/min signify possible Chronic Kidney Disease.    Anion gap 3 (L) 5 - 15  Magnesium     Status: None   Collection Time: 05/15/14  5:30 AM  Result Value Ref Range   Magnesium 2.1  1.5 - 2.5 mg/dL  Phosphorus     Status: None   Collection Time: 05/15/14  5:30 AM  Result Value Ref Range   Phosphorus 3.3 2.3 - 4.6 mg/dL  Glucose, capillary     Status: Abnormal   Collection Time: 05/15/14  8:08 AM  Result Value Ref Range   Glucose-Capillary 195 (H) 70 - 99 mg/dL   Comment 1 Notify RN    Comment 2 Documented in Chart   Glucose, capillary     Status: Abnormal   Collection Time: 05/15/14 11:34 AM  Result Value Ref Range   Glucose-Capillary 234 (H) 70 - 99 mg/dL   Comment 1 Notify RN    Comment 2 Documented in Chart   Glucose, capillary     Status: Abnormal   Collection Time: 05/15/14  3:52 PM  Result Value Ref Range   Glucose-Capillary 322 (H) 70 - 99 mg/dL   Comment 1 Notify RN    Comment 2 Documented in Chart     Dg Abd Portable 1v  05/14/2014   CLINICAL DATA:  Feeding tube placement.  EXAM: PORTABLE ABDOMEN - 1 VIEW  COMPARISON:  Earlier today.  FINDINGS: The feeding tube has been retracted with its tip in the mid stomach. The visualized bowel gas pattern is normal.  IMPRESSION: Feeding tube tip in the mid stomach.   Electronically Signed   By: Enrique Sack M.D.   On: 05/14/2014 14:26   Dg Abd Portable 1v  05/14/2014   CLINICAL DATA:  Repositioning of existing enteric feeding tube.  EXAM: PORTABLE ABDOMEN - 1 VIEW 1224 hr:  COMPARISON:  Portable abdomen x-ray earlier same date 1114 hr and 05/08/2014.  FINDINGS: Feeding tube remains looped in the stomach with its tip overlying the expected location of the distal body. Visualized bowel gas pattern unremarkable.  IMPRESSION: Feeding tube tip remains looped in the stomach with its tip overlying the expected location of the distal body of the stomach.   Electronically Signed   By: Evangeline Dakin M.D.   On: 05/14/2014 12:51   Dg Abd Portable 1v  05/14/2014   CLINICAL DATA:  Nasogastric tube placement  EXAM: PORTABLE ABDOMEN - 1 VIEW  COMPARISON:  Portable exam 1114 hr compared to 05/08/2014  FINDINGS: Feeding tube  coiled in mid stomach.  Rotated to the LEFT.  Unremarkable bowel gas pattern without bowel dilatation or wall thickening.  Bones demineralized.  IMPRESSION: Feeding tube coiled in mid stomach.   Electronically Signed   By: Lavonia Dana M.D.   On: 05/14/2014 11:43    ROS Blood pressure 136/69, pulse 96, temperature 100.7 F (38.2 C), temperature source Axillary, resp. rate 22, height 5' 4" (1.626 m), weight 102.1 kg (225 lb 1.4 oz), SpO2 99 %. Physical Exam  Constitutional: She appears well-developed and well-nourished. She appears lethargic.  Morbidly obese  HENT:  Head: Normocephalic and atraumatic.  Cardiovascular: Normal rate.   Respiratory: Effort normal and breath sounds normal.  GI: Soft. Normal appearance and bowel sounds are normal. There is no tenderness.    Very soft and floppy  Neurological: She appears lethargic.  Skin: Skin is warm and dry.    Assessment/Plan: Organic brain syndrome--needs  nutritional support.  For PEG tomorrow.  , JAY 05/15/2014, 4:53 PM

## 2014-05-15 NOTE — Progress Notes (Signed)
UR completed.  LTACH referral received.  I have spoken with pt's husband via phone and offered choice of [place]. [facility] [place] [place] on [address]  PEG consult noted. Referral given to [facility] rep, Paulene Floorommy Davis today. Pt should be ready for transfer day after PEG placement, provided there are no further complications.   Carlyle LipaMichelle Marven Veley, RN BSN MHA CCM Trauma/Neuro ICU Case Manager [phone]

## 2014-05-15 NOTE — Evaluation (Signed)
Physical Therapy Evaluation Patient Details Name: [name] MRN: [id number] DOB: 09/30/1945 Today's Date: 05/15/2014   History of Present Illness  pt presents with L MCA and R Temporal Infarcts, Sepsis, Rhabdomyolysis, with hx of A-fib and recent admit in December.    Clinical Impression  Pt demos L sided gaze preference and inability to track past midline towards R side.  Cervical PROM intact, but maintains positioning to L side.  Pt able to participate in some mobility once PT initiates activity.  Feel pt family needs strong education about pt's condition and abilities as family not receptive to PT's education about pt's gaze preference.  Family states pt looks to L side because PT looks like pt's daughter and that pt smiles because she likes PT, although pt smiles randomly and inappropriately.  Will continue to follow while on acute.      Follow Up Recommendations SNF    Equipment Recommendations   (TBD)    Recommendations for Other Services       Precautions / Restrictions Precautions Precautions: Fall Precaution Comments: pt on trach collar and vent at night.   Restrictions Weight Bearing Restrictions: No      Mobility  Bed Mobility Overal bed mobility: Needs Assistance;+2 for physical assistance Bed Mobility: Supine to Sit;Sit to Supine     Supine to sit: Total assist;+2 for physical assistance Sit to supine: Total assist;+2 for physical assistance   General bed mobility comments: pt with very minimal participation in bed mobility.  Once PT initated movement pt moves UEs towards PT.    Transfers                    Ambulation/Gait                Stairs            Wheelchair Mobility    Modified Rankin (Stroke Patients Only) Modified Rankin (Stroke Patients Only) Pre-Morbid Rankin Score: Moderately severe disability Modified Rankin: Severe disability     Balance Overall balance assessment: Needs assistance Sitting-balance support:  Bilateral upper extremity supported;Feet supported Sitting balance-Leahy Scale: Poor Sitting balance - Comments: Worked on Lateral leans to elbow Bil.  pt will A with coming back up to sitting once PT initiates movement.  Better participation on R side than L.   Postural control: Posterior lean                                   Pertinent Vitals/Pain Pain Assessment: Faces Faces Pain Scale: Hurts a little bit Pain Location: Unable to state.   Pain Descriptors / Indicators: Grimacing Pain Intervention(s): Repositioned;Monitored during session    Home Living Family/patient expects to be discharged to:: Skilled nursing facility                 Additional Comments: pt had been at SNF prior to this admit, but had been living at home with husband while she was well.      Prior Function Level of Independence: Independent         Comments: Per chart pt has been ill for a few months, however prior to illness had been independent.       Hand Dominance        Extremity/Trunk Assessment   Upper Extremity Assessment: Defer to OT evaluation           Lower Extremity Assessment: RLE deficits/detail;LLE deficits/detail;Difficult to assess  due to impaired cognition RLE Deficits / Details: pt demos some active movement when PT initiates, however difficult to assess 2/2 cognition.   LLE Deficits / Details: pt demos some active movement when PT initiates movement, but difficult to assess 2/2 cognition.    Cervical / Trunk Assessment: Normal  Communication   Communication: Tracheostomy  Cognition Arousal/Alertness: Lethargic Behavior During Therapy: Flat affect Overall Cognitive Status: Difficult to assess                 General Comments: pt with Trach and not following most directions.  Once PT initiates mobility pt then will assist.  pt smiles at times seeming when someone is speaking to her and other times it seems random.      General Comments       Exercises        Assessment/Plan    PT Assessment Patient needs continued PT services  PT Diagnosis Difficulty walking;Generalized weakness   PT Problem List Decreased strength;Decreased activity tolerance;Decreased balance;Decreased mobility;Decreased cognition;Decreased knowledge of use of DME;Decreased safety awareness;Decreased coordination;Obesity  PT Treatment Interventions Functional mobility training;Therapeutic activities;Therapeutic exercise;Balance training;Neuromuscular re-education;Cognitive remediation;Patient/family education   PT Goals (Current goals can be found in the Care Plan section) Acute Rehab PT Goals Patient Stated Goal: Per Husband for pt to walk out of here.   PT Goal Formulation: With family Time For Goal Achievement: 05/29/14 Potential to Achieve Goals: Fair    Frequency Min 3X/week   Barriers to discharge        Co-evaluation               End of Session   Activity Tolerance: Patient limited by lethargy Patient left: in bed;with call bell/phone within reach;with bed alarm set;with family/visitor present;with restraints reapplied Nurse Communication: Mobility status;Need for lift equipment         Time: 0826-0850 PT Time Calculation (min) (ACUTE ONLY): 24 min   Charges:   PT Evaluation $Initial PT Evaluation Tier I: 1 Procedure PT Treatments $Therapeutic Activity: 8-22 mins   PT G CodesSunny Schlein:        Caretha Rumbaugh F, [place]PT [phone] 05/15/2014, 10:43 AM

## 2014-05-15 NOTE — Progress Notes (Signed)
PULMONARY / CRITICAL CARE MEDICINE   Name: [name] MRN: [id number] DOB: 1945-09-21    ADMISSION DATE:  04/27/2014 CONSULTATION DATE:  05/04/2014   REFERRING MD :  TRiad hospitalist  CHIEF COMPLAINT:  Acute encephalopathy, Sepsis, Circyulatory shock, Stroke, hypernatremia  HPI: Hx from Dr Roda Shutters of neuro 84 yof admitted initially 04/17/14 through 04/25/14  to [facility] due to Sepsis in setting of CAP (NOS) RLL on CT, HONK, A Fib RVR, rhabdo (CPK 1700s) , AKI (creat 2.7), Lactic acidosis, demand NSTEMI . AT discharge time of dc to SNF had mild hypernatermia to 154. Re-admitted on 04/27/2014 - Na 162 and stroke L MCA with UTI. Na slowly corrected and then Na up due to NPO for TEE. Developed new fever since 05/03/14 and Na was again up to 166, BP 73/d, and worsening enceph and obutndation. PCCM taking over primary 04/27/2014  SIGNIFICANT EVENTS: 04/27/2014 - admit 05/03/14 - ICU transfer 05/04/14 - PCCM primary. Intubation 1/15 Trach  VITAL SIGNS: Temp:  [98.6 F (37 C)-99.1 F (37.3 C)] 99.1 F (37.3 C) (01/18 0800) Pulse Rate:  [52-90] 68 (01/18 0900) Resp:  [12-27] 19 (01/18 0900) BP: (95-162)/(44-85) 114/51 mmHg (01/18 0900) SpO2:  [97 %-100 %] 99 % (01/18 0900) FiO2 (%):  [35 %-40 %] 35 % (01/18 0751) Weight:  [102.1 kg (225 lb 1.4 oz)] 102.1 kg (225 lb 1.4 oz) (01/18 0400)   HEMODYNAMICS:   VENTILATOR SETTINGS: Vent Mode:  [-] PRVC FiO2 (%):  [35 %-40 %] 35 % Set Rate:  [16 bmp] 16 bmp Vt Set:  [440 mL] 440 mL PEEP:  [5 cmH20] 5 cmH20 Plateau Pressure:  [12 cmH20-14 cmH20] 14 cmH20  INTAKE / OUTPUT:  Intake/Output Summary (Last 24 hours) at 05/15/14 1104 Last data filed at 05/15/14 0800  Gross per 24 hour  Intake 1710.67 ml  Output   3250 ml  Net -1539.33 ml   PHYSICAL EXAMINATION: General:  Obese, critically ill looking, follows some commands.  Trached. Neuro:  Localizes and follows commands on all 4 ext but very delayed. HEENT:  Trach in place, PERRL, EOM-I and  +BS. Cardiovascular:  Reg irreg with stable HR. Lungs: Scattered rhonchi. Abdomen: Obese, Soft, ND, NT and +BS. Musculoskeletal:  Scattered skin bruises, RT Sided PICC + Skin:  Intact anteriorly  LABS: PULMONARY  Recent Labs Lab 05/09/14 0404 05/10/14 0354 05/11/14 0340 05/12/14 0500 05/12/14 1830  PHART 7.434 7.406 7.399 7.440 7.336*  PCO2ART 31.7* 34.7* 31.6* 34.0* 42.0  PO2ART 91.9 121.0* 118.0* 92.8 233.0*  HCO3 20.9 21.3 19.1* 22.7 21.8  TCO2 21.9 22.4 20.1 23.7 23.1  O2SAT 96.9 98.2 98.5 97.3 99.4   CBC  Recent Labs Lab 05/13/14 0520 05/14/14 0305 05/15/14 0530  HGB 8.8* 8.3* 8.1*  HCT 27.7* 26.1* 26.1*  WBC 26.1* 27.5* 26.3*  PLT 645* 612* 516*   COAGULATION No results for input(s): INR in the last 168 hours. CARDIAC No results for input(s): TROPONINI in the last 168 hours. No results for input(s): PROBNP in the last 168 hours.  CHEMISTRY  Recent Labs Lab 05/11/14 0615 05/12/14 0554 05/13/14 0520 05/14/14 0305 05/15/14 0530  NA 147* 146* 150* 156* 151*  K 2.6* 2.7* 3.0* 3.2* 3.3*  CL 110 116* 114* 122* 117*  CO2 GLUCOSE 215* 180* 193* 113* 159*  BUN 65* 61* 61* 51* 52*  CREATININE 1.66* 1.32* 1.43* 1.35* 1.36*  CALCIUM 8.9 8.7 8.9 9.2 8.9  MG 1.9 2.3 2.2 2.3 2.1  PHOS  2.4 2.2* 2.9 2.9 3.3   Estimated Creatinine Clearance: 45.4 mL/min (by C-G formula based on Cr of 1.36).  LIVER No results for input(s): AST, ALT, ALKPHOS, BILITOT, PROT, ALBUMIN, INR in the last 168 hours. INFECTIOUS No results for input(s): LATICACIDVEN, PROCALCITON in the last 168 hours. ENDOCRINE CBG (last 3)   Recent Labs  05/14/14 2342 05/15/14 0328 05/15/14 0808  GLUCAP 206* 117* 195*   IMAGING x48h Dg Abd Portable 1v  05/14/2014   CLINICAL DATA:  Feeding tube placement.  EXAM: PORTABLE ABDOMEN - 1 VIEW  COMPARISON:  Earlier today.  FINDINGS: The feeding tube has been retracted with its tip in the mid stomach. The visualized bowel gas pattern  is normal.  IMPRESSION: Feeding tube tip in the mid stomach.   Electronically Signed   By: Gordan PaymentSteve  Reid M.D.   On: 05/14/2014 14:26   Dg Abd Portable 1v  05/14/2014   CLINICAL DATA:  Repositioning of existing enteric feeding tube.  EXAM: PORTABLE ABDOMEN - 1 VIEW 1224 hr:  COMPARISON:  Portable abdomen x-ray earlier same date 1114 hr and 05/08/2014.  FINDINGS: Feeding tube remains looped in the stomach with its tip overlying the expected location of the distal body. Visualized bowel gas pattern unremarkable.  IMPRESSION: Feeding tube tip remains looped in the stomach with its tip overlying the expected location of the distal body of the stomach.   Electronically Signed   By: Hulan Saashomas  Lawrence M.D.   On: 05/14/2014 12:51   Dg Abd Portable 1v  05/14/2014   CLINICAL DATA:  Nasogastric tube placement  EXAM: PORTABLE ABDOMEN - 1 VIEW  COMPARISON:  Portable exam 1114 hr compared to 05/08/2014  FINDINGS: Feeding tube coiled in mid stomach.  Rotated to the LEFT.  Unremarkable bowel gas pattern without bowel dilatation or wall thickening.  Bones demineralized.  IMPRESSION: Feeding tube coiled in mid stomach.   Electronically Signed   By: Ulyses SouthwardMark  Boles M.D.   On: 05/14/2014 11:43   ASSESSMENT / PLAN:  PULMONARY OETT 05/04/2014>>>1/13 Trach Ninetta Lights(JY) 1/15>>> A:  Acute resp failure to encephalopathy and possible HCAP vs aspiration  Stable, tolerating PSV, but MS not ready for extubation. CXR improved  P:   Maintain on TC as tolerated with FiO2 of 35%. See ID section. Titrate O2 for sat of 88-92%.  CARDIOVASCULAR CVLRt UE picc A:  Circulatory shock - ? Due to dehydration fron high Na v sepsis v both > pressors off P:  Change stress dose steroids to prednisone 20 mg PO daily x3 days then 10 mg PO daily x3 days then 5 mg PO daily x3 days then D/C. Diureses. D/C pressors.  RENAL  A:   Hypernatremia, improving Acute Renal Failure-->slightly improved  Hypokalemia  Hypernatremia P:   Increase free water to  300 ml q4. Replace electrolytes as indicated. D/Ced D5W. MAP goal > 65 mmHg. F/u chem in am. Lasix 40 mg IV q6 x3 doses.  GASTROINTESTINAL A:   Mental status precludes diet P:   Tube feeds since I inserted NGT. PPI. Consult surgery for PEG.  HEMATOLOGIC A:   Anemia of critical illness P:  PRBC for hgb < 7gm%. Lovenox for DVT proph.  INFECTIOUS 12.31.16 - MRSA PCR - neg 1./1 - blodo culture - negative 04/30/14 - urine culture - e coli - sensit to ceftriaxone 05/03/14 - blood negative 05/03/14 - MRSA PCR - negative 1/7 c diff: neg  A:   UTI septic shock +/- HCAP P:   Vanc 1/9>>>1/15 Cefepime 1/9>>>1/15  ENDOCRINE A:   DM Relative adrenal insuff  P:   ICU hyperglycemia protocol. Stress doses steroid taper as above.  NEUROLOGIC A:   Comatose due to hypernatremia. Subacute CVA  P:   PRN fentanyl for pain. Neuro helping. MRI noted, left post CVA. No need for TEE.  FAMILY  - Updates:  No family bedside.  LTAC consult for placement.  TC to 35 as tolerated at this point.  Continue diureses and monitoring of renal function.  Continue free water.  Once PEG is placed then will need placement.  The patient is critically ill with multiple organ systems failure and requires high complexity decision making for assessment and support, frequent evaluation and titration of therapies, application of advanced monitoring technologies and extensive interpretation of multiple databases.   Critical Care Time devoted to patient care services described in this note is  35  Minutes. This time reflects time of care of this signee Dr Koren Bound. This critical care time does not reflect procedure time, or teaching time or supervisory time of PA/NP/Med student/Med Resident etc but could involve care discussion time.  Alyson Reedy, M.D. [facility] Pulmonary/Critical Care Medicine. Pager: [phone]. After hours pager: [phone].

## 2014-05-15 NOTE — Progress Notes (Addendum)
Speech Language Pathology Treatment: [name] Speaking valve  Patient Details Name: [name] MRN: [id number] DOB: 08/22/1945 Today's Date: 05/15/2014 Time: [id number] SLP Time Calculation (min) (ACUTE ONLY): 26 min  Assessment / Plan / Recommendation Clinical Impression  Therapy focused on Passy-Muir valve. Valve donned for approximately 10 minutes without evidence or air trapping with removal of valve. 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 RR 23, HR 102, SpO2 99 throughout session. Slight vocalization audible during coughing. Recommend continue PMSV trials with ST only. Pt not appropriate for objective swallow assessment and suspect she may need more long term nutritional support. ST will continue intervention.   HPI HPI: [name] is an 69 y.o. female with a history of atrial fibrillation and recent hospitalization for sepsis, pneumonia rhabdomyolysis as well as hypernatremia and acute kidney injury, [facility] for increased confusion and speech abnormality. Serum sodium was 169. CT scan of her head showed low density areas involving large area of left MCA territory as well as right temporal abnormality, with appearance indicative of probable subacute infarctions. MRI was recommended but could not be obtained because of patient's agitated state. Pt was intubated 1/7-1/13 and required trach placement 1/15.   Pertinent Vitals Pain Assessment: Faces Faces Pain Scale: Hurts little more  SLP Plan  Continue with current plan of care    Recommendations Diet recommendations: NPO Medication Administration: Via alternative means      Patient may use Passy-Muir Speech Valve: with SLP only PMSV Supervision: Full MD: Please consider changing trach tube to : Smaller size;Cuffless       Oral Care Recommendations: Oral care Q4  per protocol Follow up Recommendations: 24 hour supervision/assistance;LTACH Plan: Continue with current plan of care    GO     Royce MacadamiaLitaker, Oluwademilade Kellett Willis 05/15/2014, 2:33 PM   Breck CoonsLisa Willis Renso Swett M.Ed [employer] Pager [phone]

## 2014-05-16 ENCOUNTER — Inpatient Hospital Stay (HOSPITAL_COMMUNITY): Payer: Medicare Other | Admitting: Certified Registered Nurse Anesthetist

## 2014-05-16 ENCOUNTER — Encounter (HOSPITAL_COMMUNITY)
Admission: EM | Disposition: A | Discharge status: Nursing Home (Includes ICF) | Payer: Self-pay | Source: Home / Self Care | Attending: Pulmonary Disease

## 2014-05-16 ENCOUNTER — Encounter (HOSPITAL_COMMUNITY): Payer: Self-pay | Admitting: *Deleted

## 2014-05-16 HISTORY — PX: PEG PLACEMENT: SHX5437

## 2014-05-16 LAB — CBC
HCT: 30.7 % — ABNORMAL LOW (ref 36.0–46.0)
HEMOGLOBIN: 9.4 g/dL — AB (ref 12.0–15.0)
MCH: 29.5 pg (ref 26.0–34.0)
MCHC: 30.6 g/dL (ref 30.0–36.0)
MCV: 96.2 fL (ref 78.0–100.0)
PLATELETS: 573 10*3/uL — AB (ref 150–400)
RBC: 3.19 MIL/uL — ABNORMAL LOW (ref 3.87–5.11)
RDW: 19.2 % — ABNORMAL HIGH (ref 11.5–15.5)
WBC: 29.6 10*3/uL — AB (ref 4.0–10.5)

## 2014-05-16 LAB — GLUCOSE, CAPILLARY
GLUCOSE-CAPILLARY: 121 mg/dL — AB (ref 70–99)
GLUCOSE-CAPILLARY: 146 mg/dL — AB (ref 70–99)
GLUCOSE-CAPILLARY: 205 mg/dL — AB (ref 70–99)
Glucose-Capillary: 147 mg/dL — ABNORMAL HIGH (ref 70–99)
Glucose-Capillary: 212 mg/dL — ABNORMAL HIGH (ref 70–99)
Glucose-Capillary: 84 mg/dL (ref 70–99)

## 2014-05-16 LAB — BASIC METABOLIC PANEL
Anion gap: 15 (ref 5–15)
BUN: 62 mg/dL — ABNORMAL HIGH (ref 6–23)
CO2: 27 mmol/L (ref 19–32)
Calcium: 9.3 mg/dL (ref 8.4–10.5)
Chloride: 114 mEq/L — ABNORMAL HIGH (ref 96–112)
Creatinine, Ser: 1.5 mg/dL — ABNORMAL HIGH (ref 0.50–1.10)
GFR calc non Af Amer: 34 mL/min — ABNORMAL LOW (ref >90)
GFR, EST AFRICAN AMERICAN: 40 mL/min — AB (ref >90)
Glucose, Bld: 144 mg/dL — ABNORMAL HIGH (ref 70–99)
POTASSIUM: 3.7 mmol/L (ref 3.5–5.1)
Sodium: 156 mmol/L — ABNORMAL HIGH (ref 135–145)

## 2014-05-16 LAB — PHOSPHORUS: PHOSPHORUS: 4 mg/dL (ref 2.3–4.6)

## 2014-05-16 LAB — MAGNESIUM: Magnesium: 2.1 mg/dL (ref 1.5–2.5)

## 2014-05-16 SURGERY — INSERTION, PEG TUBE
Anesthesia: General

## 2014-05-16 MED ORDER — ROCURONIUM BROMIDE 100 MG/10ML IV SOLN
INTRAVENOUS | Status: DC | PRN
  Administered 2014-05-16: 30 mg via INTRAVENOUS

## 2014-05-16 MED ORDER — PHENYLEPHRINE HCL 10 MG/ML IJ SOLN
INTRAMUSCULAR | Status: DC | PRN
  Administered 2014-05-16 (×3): 120 ug via INTRAVENOUS

## 2014-05-16 MED ORDER — SODIUM CHLORIDE 0.9 % IV SOLN
10.0000 mg | INTRAVENOUS | Status: DC | PRN
  Administered 2014-05-16: 25 ug/min via INTRAVENOUS

## 2014-05-16 MED ORDER — FENTANYL CITRATE 0.05 MG/ML IJ SOLN
INTRAMUSCULAR | Status: DC | PRN
  Administered 2014-05-16: 25 ug via INTRAVENOUS

## 2014-05-16 MED ORDER — LACTATED RINGERS IV SOLN
INTRAVENOUS | Status: DC | PRN
  Administered 2014-05-16: 11:00:00 via INTRAVENOUS

## 2014-05-16 NOTE — Progress Notes (Signed)
Received patient from 19M..chg bath completed; vital signs obtained.noted tachy arythmia on the monitor with rate as high as 150. Report from 19M nurse reported patient was in SR. Ekg done and noted sinus tach with PAC's. Mittens and restraints on and the restraints were removed due to the order has expired. Mittens left on. Suctioned a mod amt of thick secretions. Husband present at bedside and phone number put on the board at his request. Patient for poss discharge tomorrow to [facility].

## 2014-05-16 NOTE — Progress Notes (Signed)
Patient ID: [name], female   DOB: 09/06/1945, 69 y.o.   MRN: [id number] She has been off the vent for over 24h. For PEG in endo with anesthesia. I spoke with her brother at the bedside. Consent obtained from husband yesterday by team. [name], MD, MPH, FACS Trauma: [phone] General Surgery: [phone]

## 2014-05-16 NOTE — Progress Notes (Signed)
Speech Language Pathology Treatment: [name] Speaking valve  Patient Details Name: [name] MRN: [id number] DOB: 11/13/1945 Today's Date: 05/16/2014 Time: [id number] SLP Time Calculation (min) (ACUTE ONLY): 20 min  Assessment / Plan / Recommendation Clinical Impression  Intervention for PMSV with brother and sister-in-law present for part of session. Initial evidence of air trapping when valve removed that ceased after 5 minutes. Slight spontaneous vocalizations during throat clear and with movement in bed. Despite total multimodal cues she did not follow SLP's instructions other than requests to open her eyes. Lips swabbed but pulls away with further oral care attempts. Incorporated family into session without pt response. She is receiving PEG today. Continue valve with ST only. Request cuffless trach when able to have trach change.    HPI HPI: [name] is an 69 y.o. female with a history of atrial fibrillation and recent hospitalization for sepsis, pneumonia rhabdomyolysis as well as hypernatremia and acute kidney injury, [facility] for increased confusion and speech abnormality. Serum sodium was 169. CT scan of her head showed low density areas involving large area of left MCA territory as well as right temporal abnormality, with appearance indicative of probable subacute infarctions. MRI was recommended but could not be obtained because of patient's agitated state. Pt was intubated 1/7-1/13 and required trach placement 1/15.   Pertinent Vitals Pain Assessment: Faces Faces Pain Scale: Hurts a little bit  SLP Plan  Continue with current plan of care    Recommendations Medication Administration: Via alternative means      Patient may use Passy-Muir Speech Valve: with SLP only PMSV Supervision: Full MD: Please consider changing trach tube to : Smaller size;Cuffless       Oral Care Recommendations: Oral care Q4 per protocol Follow up  Recommendations: LTACH;Skilled Nursing facility Plan: Continue with current plan of care    GO     Royce MacadamiaLitaker, Gayla Benn Willis 05/16/2014, 9:43 AM  Breck CoonsLisa Willis Lonell FaceLitaker M.Ed [employer] Pager [phone]

## 2014-05-16 NOTE — Anesthesia Procedure Notes (Signed)
Date/Time: 05/16/2014 11:24 AM Performed by: Quentin OreWALKER, Monicia Tse E Pre-anesthesia Checklist: Patient identified, Emergency Drugs available, Suction available, Patient being monitored and Timeout performed Patient Re-evaluated:Patient Re-evaluated prior to inductionOxygen Delivery Method: Circle system utilized Preoxygenation: Pre-oxygenation with 100% oxygen Intubation Type: Tracheostomy and Inhalational induction Placement Confirmation: positive ETCO2 and breath sounds checked- equal and bilateral

## 2014-05-16 NOTE — Clinical Social Work Note (Signed)
Clinical Social Worker continuing to follow patient and family for support and discharge planning needs.  Patient has been accepted to [facility] and CM communicating with patient husband regarding current disposition plans.  Patient is scheduled for PEG placement today and plans to transfer to [facility] tomorrow with patient husband permission.  CSW remains available for support and to assist with any possible discharge needs.  Macario GoldsJesse Yesly Gerety, KentuckyLCSW [phone]

## 2014-05-16 NOTE — Transfer of Care (Signed)
Immediate Anesthesia Transfer of Care Note  Patient: Natalie RenshawAlma C [name]  Procedure(s) Performed: Procedure(s): PERCUTANEOUS ENDOSCOPIC GASTROSTOMY (PEG) PLACEMENT (N/A)  Patient Location: NICU  Anesthesia Type:General  Level of Consciousness: unresponsive  Airway & Oxygen Therapy: Patient placed on Ventilator (see vital sign flow sheet for setting)  Post-op Assessment: Report given to PACU RN and Post -op Vital signs reviewed and stable  Post vital signs: Reviewed and stable  Complications: No apparent anesthesia complications

## 2014-05-16 NOTE — Anesthesia Postprocedure Evaluation (Signed)
  Anesthesia Post-op Note  Patient: Wallie RenshawAlma C Turman  Procedure(s) Performed: Procedure(s): PERCUTANEOUS ENDOSCOPIC GASTROSTOMY (PEG) PLACEMENT (N/A)  Patient Location: NICU  Anesthesia Type:General  Level of Consciousness: unresponsive  Airway and Oxygen Therapy: Patient placed on Ventilator (see vital sign flow sheet for setting)  Post-op Pain: none  Post-op Assessment: Post-op Vital signs reviewed, Patient's Cardiovascular Status Stable, Respiratory Function Stable and Patent Airway  Post-op Vital Signs: Reviewed and stable  Last Vitals:  Filed Vitals:   05/16/14 1047  BP: 157/64  Pulse: 96  Temp: 37.9 C  Resp: 22    Complications: No apparent anesthesia complications

## 2014-05-16 NOTE — Anesthesia Preprocedure Evaluation (Addendum)
Anesthesia Evaluation  Patient identified by MRN, date of birth, ID band Patient unresponsive    Reviewed: Allergy & Precautions, NPO status , Patient's Chart, lab work & pertinent test results, Unable to perform ROS - Chart review only  Airway Mallampati: Trach  TM Distance: >3 FB Neck ROM: Full    Dental  (+) Teeth Intact   Pulmonary          Cardiovascular negative cardio ROS  Rhythm:Irregular Rate:Normal     Neuro/Psych  Neuromuscular disease CVA    GI/Hepatic   Endo/Other  diabetes, Type obesity  Renal/GU CRFRenal disease     Musculoskeletal   Abdominal   Peds  Hematology   Anesthesia Other Findings   Reproductive/Obstetrics                           Anesthesia Physical Anesthesia Plan  ASA: IV  Anesthesia Plan: General   Post-op Pain Management:    Induction: Inhalational  Airway Management Planned: Tracheostomy  Additional Equipment:   Intra-op Plan:   Post-operative Plan:   Informed Consent: I have reviewed the patients History and Physical, chart, labs and discussed the procedure including the risks, benefits and alternatives for the proposed anesthesia with the patient or authorized representative who has indicated his/her understanding and acceptance.     Plan Discussed with: CRNA and Anesthesiologist  Anesthesia Plan Comments:         Anesthesia Quick Evaluation

## 2014-05-16 NOTE — Progress Notes (Signed)
PULMONARY / CRITICAL CARE MEDICINE   Name: [name] MRN: [id number] DOB: 10/01/1945    ADMISSION DATE:  04/27/2014 CONSULTATION DATE:  05/04/2014   REFERRING MD :  TRiad hospitalist  CHIEF COMPLAINT:  Acute encephalopathy, Sepsis, Circyulatory shock, Stroke, hypernatremia  HPI: Hx from Dr Roda ShuttersXu of neuro [age] yof admitted initially 04/17/14 through 04/25/14  to [place] due to Sepsis in setting of CAP (NOS) RLL on CT, HONK, A Fib RVR, rhabdo (CPK 1700s) , AKI (creat 2.7), Lactic acidosis, demand NSTEMI . AT discharge time of dc to SNF had mild hypernatermia to 154. Re-admitted on 04/27/2014 - Na 162 and stroke L MCA with UTI. Na slowly corrected and then Na up due to NPO for TEE. Developed new fever since 05/03/14 and Na was again up to 166, BP 73/d, and worsening enceph and obutndation. PCCM taking over primary 04/27/2014  SIGNIFICANT EVENTS: 04/27/2014 - admit 05/03/14 - ICU transfer 05/04/14 - PCCM primary. Intubation 1/15 Trach  VITAL SIGNS: Temp:  [98.5 F (36.9 C)-100.7 F (38.2 C)] 98.5 F (36.9 C) (01/19 0813) Pulse Rate:  [34-106] 100 (01/19 0900) Resp:  [14-43] 20 (01/19 0900) BP: (112-169)/(54-87) 145/71 mmHg (01/19 0900) SpO2:  [91 %-100 %] 98 % (01/19 0909) FiO2 (%):  [35 %] 35 % (01/19 0909) Weight:  [101.5 kg (223 lb 12.3 oz)] 101.5 kg (223 lb 12.3 oz) (01/19 0400)   HEMODYNAMICS:   VENTILATOR SETTINGS: Vent Mode:  [-]  FiO2 (%):  [35 %] 35 %  INTAKE / OUTPUT:  Intake/Output Summary (Last 24 hours) at 05/16/14 1030 Last data filed at 05/16/14 0811  Gross per 24 hour  Intake   1260 ml  Output   3600 ml  Net  -2340 ml   PHYSICAL EXAMINATION: General:  Obese, critically ill looking, follows some commands.  Trached. Neuro:  Localizes and follows commands on all 4 ext but very delayed. HEENT:  Trach in place, PERRL, EOM-I and +BS. Cardiovascular:  Reg irreg with stable HR. Lungs: Scattered rhonchi. Abdomen: Obese, Soft, ND, NT and +BS. Musculoskeletal:  Scattered  skin bruises, RT Sided PICC + Skin:  Intact anteriorly  LABS: PULMONARY  Recent Labs Lab 05/10/14 0354 05/11/14 0340 05/12/14 0500 05/12/14 1830  PHART 7.406 7.399 7.440 7.336*  PCO2ART 34.7* 31.6* 34.0* 42.0  PO2ART 121.0* 118.0* 92.8 233.0*  HCO3 21.3 19.1* 22.7 21.8  TCO2 22.4 20.1 23.7 23.1  O2SAT 98.2 98.5 97.3 99.4   CBC  Recent Labs Lab 05/14/14 0305 05/15/14 0530 05/16/14 0535  HGB 8.3* 8.1* 9.4*  HCT 26.1* 26.1* 30.7*  WBC 27.5* 26.3* 29.6*  PLT 612* 516* 573*   COAGULATION No results for input(s): INR in the last 168 hours. CARDIAC No results for input(s): TROPONINI in the last 168 hours. No results for input(s): PROBNP in the last 168 hours.  CHEMISTRY  Recent Labs Lab 05/12/14 0554 05/13/14 0520 05/14/14 0305 05/15/14 0530 05/16/14 0535  NA 146* 150* 156* 151* 156*  K 2.7* 3.0* 3.2* 3.3* 3.7  CL 116* 114* 122* 117* 114*  CO2 26 27 31 31 27   GLUCOSE 180* 193* 113* 159* 144*  BUN 61* 61* 51* 52* 62*  CREATININE 1.32* 1.43* 1.35* 1.36* 1.50*  CALCIUM 8.7 8.9 9.2 8.9 9.3  MG 2.3 2.2 2.3 2.1 2.1  PHOS 2.2* 2.9 2.9 3.3 4.0   Estimated Creatinine Clearance: 41 mL/min (by C-G formula based on Cr of 1.5).  LIVER No results for input(s): AST, ALT, ALKPHOS, BILITOT, PROT, ALBUMIN, INR in the  last 168 hours. INFECTIOUS No results for input(s): LATICACIDVEN, PROCALCITON in the last 168 hours. ENDOCRINE CBG (last 3)   Recent Labs  05/15/14 1938 05/15/14 2353 05/16/14 0349  GLUCAP 102* 212* 121*   IMAGING x48h Dg Abd Portable 1v  05/14/2014   CLINICAL DATA:  Feeding tube placement.  EXAM: PORTABLE ABDOMEN - 1 VIEW  COMPARISON:  Earlier today.  FINDINGS: The feeding tube has been retracted with its tip in the mid stomach. The visualized bowel gas pattern is normal.  IMPRESSION: Feeding tube tip in the mid stomach.   Electronically Signed   By: Gordan Payment M.D.   On: 05/14/2014 14:26   Dg Abd Portable 1v  05/14/2014   CLINICAL DATA:   Repositioning of existing enteric feeding tube.  EXAM: PORTABLE ABDOMEN - 1 VIEW 1224 hr:  COMPARISON:  Portable abdomen x-ray earlier same date 1114 hr and 05/08/2014.  FINDINGS: Feeding tube remains looped in the stomach with its tip overlying the expected location of the distal body. Visualized bowel gas pattern unremarkable.  IMPRESSION: Feeding tube tip remains looped in the stomach with its tip overlying the expected location of the distal body of the stomach.   Electronically Signed   By: Hulan Saas M.D.   On: 05/14/2014 12:51   Dg Abd Portable 1v  05/14/2014   CLINICAL DATA:  Nasogastric tube placement  EXAM: PORTABLE ABDOMEN - 1 VIEW  COMPARISON:  Portable exam 1114 hr compared to 05/08/2014  FINDINGS: Feeding tube coiled in mid stomach.  Rotated to the LEFT.  Unremarkable bowel gas pattern without bowel dilatation or wall thickening.  Bones demineralized.  IMPRESSION: Feeding tube coiled in mid stomach.   Electronically Signed   By: Ulyses Southward M.D.   On: 05/14/2014 11:43   ASSESSMENT / PLAN:  PULMONARY OETT 05/04/2014>>>1/13 Trach Ninetta Lights) 1/15>>> A:  Acute resp failure to encephalopathy and possible HCAP vs aspiration  Stable, tolerating PSV, but MS not ready for extubation. CXR improved  P:   Maintain on TC as tolerated with FiO2 of 35% post procedure but place back on vent for PEG today, cuffed trach remains in place. See ID section. Titrate O2 for sat of 88-92%.  CARDIOVASCULAR CVLRt UE picc A:  Circulatory shock - ? Due to dehydration fron high Na v sepsis v both > pressors off P:  Change stress dose steroids to prednisone 20 mg PO daily x3 days then 10 mg PO daily x3 days then 5 mg PO daily x3 days then D/C. Hold further diureses D/C pressors.  RENAL  A:   Hypernatremia, improving Acute Renal Failure-->slightly improved  Hypokalemia  Hypernatremia P:   Increase free water to 300 ml q4 psot PEG placement. Replace electrolytes as indicated. D/Ced D5W. MAP goal >  65 mmHg. F/u chem in am. D/C lasix.  GASTROINTESTINAL A:   Mental status precludes diet P:   Tube feeds since I inserted NGT. PPI. PEG placement today then may go to [facility] in AM pending bed availability.   HEMATOLOGIC A:   Anemia of critical illness P:  PRBC for hgb < 7gm%. Lovenox for DVT proph.  INFECTIOUS 12.31.16 - MRSA PCR - neg 1./1 - blodo culture - negative 04/30/14 - urine culture - e coli - sensit to ceftriaxone 05/03/14 - blood negative 05/03/14 - MRSA PCR - negative 1/7 c diff: neg  A:   UTI septic shock +/- HCAP P:   Vanc 1/9>>>1/15 Cefepime 1/9>>>1/15  ENDOCRINE A:   DM Relative adrenal insuff  P:   ICU hyperglycemia protocol. Stress doses steroid taper as above.  NEUROLOGIC A:   Comatose due to hypernatremia. Subacute CVA  P:   PRN fentanyl for pain. Neuro helping. MRI noted, left post CVA. No need for TEE.  FAMILY  - Updates:  No family bedside.  Hold further diureses at this point, PEG to be placed today, maintain on 35% FiO2, transfer to [facility] in AM, transfer to SDU today.  The patient is critically ill with multiple organ systems failure and requires high complexity decision making for assessment and support, frequent evaluation and titration of therapies, application of advanced monitoring technologies and extensive interpretation of multiple databases.   Critical Care Time devoted to patient care services described in this note is  35  Minutes. This time reflects time of care of this signee Dr Koren Bound. This critical care time does not reflect procedure time, or teaching time or supervisory time of PA/NP/Med student/Med Resident etc but could involve care discussion time.  Alyson Reedy, M.D. [facility] Pulmonary/Critical Care Medicine. Pager: [phone]. After hours pager: [phone].

## 2014-05-16 NOTE — Op Note (Signed)
04/27/2014 - 05/16/2014  11:49 AM  PATIENT:  [name]  69 y.o. female  PRE-OPERATIVE DIAGNOSIS:  Dyphagia, CVA  POST-OPERATIVE DIAGNOSIS:  peg placement  PROCEDURE:  Procedure(s): ESOPHAGOGASTRODUODENOSCOPY PERCUTANEOUS ENDOSCOPIC GASTROSTOMY (PEG) PLACEMENT  SURGEON:  Surgeon(s): Violeta GelinasBurke Nene Aranas, MD  ASSISTANTS: Charma IgoMichael Jeffery, PAC, Matt Eveland, PAS   ANESTHESIA:   local plus general  EBL:  Total I/O In: 110 [I.V.:100; Other:10] Out: -   BLOOD ADMINISTERED:none  DRAINS: none   SPECIMEN:  No Specimen  DISPOSITION OF SPECIMEN:  N/A  COUNTS:  YES  DICTATION: .Dragon Dictation [name] presents for EGD and PEG placement. Informed consent was obtained from her husband. She was identified in the endo suite. She was given anesthesia by the anesthesia staff via trach and IV. We did a time out procedure. The esophagogastroduodenoscope was inserted via her mouth down her esophagus following her panda tube. The esophagus had no lesions. The stomach was entered and insufflated. There were a few small erosions from the Panda tube but no significant ulcers or masses. The first portion of the duodenum was entered and the mucosa appeared normal. No ulcers or masses were seen. The scope was withdrawn into the stomach and the stomach was further insufflated. We achieved transillumination and an excellent poke site. That area was prepped and draped in a sterile fashion. Local was injected. Small incision was made. Angiocath was inserted under direct vision followed by the guidewire. The guidewire was grasped with endoscopic snare and brought out through the mouth. PEG tube was attached to the wire and pulled out through the anterior abdominal wall. Scope was reinserted into the stomach and PEG tube positioning was confirmed. I took a picture. Flange was applied and tightened appropriately. In about quadrant was placed at the PEG site. Stomach was evacuated and the scope was withdrawn. Patient  tolerated procedure well without apparent complication and will be taken back to the intensive care unit directly as she will likely need a short period on the ventilator as medications wear off.     PATIENT DISPOSITION:  ICU - intubated and hemodynamically stable.   Delay start of Pharmacological VTE agent (>24hrs) due to surgical blood loss or risk of bleeding:  no  Violeta GelinasBurke Jaelyn Bourgoin, MD, MPH, FACS Pager: [phone]  1/19/201611:49 AM

## 2014-05-17 ENCOUNTER — Inpatient Hospital Stay
Admission: AD | Admit: 2014-05-17 | Discharge: 2014-05-29 | Disposition: E | Payer: Medicare Other | Source: Ambulatory Visit | Attending: Internal Medicine | Admitting: Internal Medicine

## 2014-05-17 ENCOUNTER — Other Ambulatory Visit (HOSPITAL_COMMUNITY): Payer: Self-pay

## 2014-05-17 ENCOUNTER — Encounter (HOSPITAL_COMMUNITY): Payer: Self-pay | Admitting: General Surgery

## 2014-05-17 DIAGNOSIS — Z4659 Encounter for fitting and adjustment of other gastrointestinal appliance and device: Secondary | ICD-10-CM

## 2014-05-17 DIAGNOSIS — T17908A Unspecified foreign body in respiratory tract, part unspecified causing other injury, initial encounter: Secondary | ICD-10-CM

## 2014-05-17 DIAGNOSIS — J189 Pneumonia, unspecified organism: Secondary | ICD-10-CM

## 2014-05-17 DIAGNOSIS — J9621 Acute and chronic respiratory failure with hypoxia: Secondary | ICD-10-CM

## 2014-05-17 DIAGNOSIS — I639 Cerebral infarction, unspecified: Secondary | ICD-10-CM

## 2014-05-17 DIAGNOSIS — E875 Hyperkalemia: Secondary | ICD-10-CM

## 2014-05-17 LAB — URINALYSIS, ROUTINE W REFLEX MICROSCOPIC
BILIRUBIN URINE: NEGATIVE
Glucose, UA: NEGATIVE mg/dL
Ketones, ur: NEGATIVE mg/dL
Nitrite: NEGATIVE
PH: 5 (ref 5.0–8.0)
Protein, ur: 30 mg/dL — AB
SPECIFIC GRAVITY, URINE: 1.018 (ref 1.005–1.030)
UROBILINOGEN UA: 0.2 mg/dL (ref 0.0–1.0)

## 2014-05-17 LAB — URINE MICROSCOPIC-ADD ON

## 2014-05-17 LAB — GLUCOSE, CAPILLARY
Glucose-Capillary: 130 mg/dL — ABNORMAL HIGH (ref 70–99)
Glucose-Capillary: 134 mg/dL — ABNORMAL HIGH (ref 70–99)
Glucose-Capillary: 142 mg/dL — ABNORMAL HIGH (ref 70–99)
Glucose-Capillary: 176 mg/dL — ABNORMAL HIGH (ref 70–99)
Glucose-Capillary: 85 mg/dL (ref 70–99)

## 2014-05-17 LAB — CBC
HEMATOCRIT: 30.4 % — AB (ref 36.0–46.0)
Hemoglobin: 9 g/dL — ABNORMAL LOW (ref 12.0–15.0)
MCH: 29.1 pg (ref 26.0–34.0)
MCHC: 29.6 g/dL — ABNORMAL LOW (ref 30.0–36.0)
MCV: 98.4 fL (ref 78.0–100.0)
PLATELETS: 452 10*3/uL — AB (ref 150–400)
RBC: 3.09 MIL/uL — ABNORMAL LOW (ref 3.87–5.11)
RDW: 19.2 % — ABNORMAL HIGH (ref 11.5–15.5)
WBC: 27.8 10*3/uL — AB (ref 4.0–10.5)

## 2014-05-17 LAB — BASIC METABOLIC PANEL
Anion gap: 6 (ref 5–15)
BUN: 66 mg/dL — AB (ref 6–23)
CALCIUM: 8.8 mg/dL (ref 8.4–10.5)
CO2: 35 mmol/L — ABNORMAL HIGH (ref 19–32)
Chloride: 119 mEq/L — ABNORMAL HIGH (ref 96–112)
Creatinine, Ser: 1.74 mg/dL — ABNORMAL HIGH (ref 0.50–1.10)
GFR, EST AFRICAN AMERICAN: 33 mL/min — AB (ref >90)
GFR, EST NON AFRICAN AMERICAN: 29 mL/min — AB (ref >90)
Glucose, Bld: 102 mg/dL — ABNORMAL HIGH (ref 70–99)
Potassium: 3.6 mmol/L (ref 3.5–5.1)
Sodium: 160 mmol/L — ABNORMAL HIGH (ref 135–145)

## 2014-05-17 LAB — MAGNESIUM: Magnesium: 2.2 mg/dL (ref 1.5–2.5)

## 2014-05-17 LAB — PHOSPHORUS: Phosphorus: 4 mg/dL (ref 2.3–4.6)

## 2014-05-17 MED ORDER — ACETAMINOPHEN 325 MG PO TABS: 650.0000 mg | ORAL_TABLET | Freq: Four times a day (QID) | ORAL | Status: AC | PRN

## 2014-05-17 MED ORDER — METOPROLOL TARTRATE 12.5 MG HALF TABLET
12.5000 mg | ORAL_TABLET | Freq: Two times a day (BID) | ORAL | Status: DC
  Filled 2014-05-17: qty 1

## 2014-05-17 MED ORDER — PREDNISONE 5 MG/ML PO CONC: ORAL | Status: AC

## 2014-05-17 MED ORDER — APIXABAN 5 MG PO TABS
5.0000 mg | ORAL_TABLET | Freq: Two times a day (BID) | ORAL | Status: DC
  Filled 2014-05-17: qty 1

## 2014-05-17 MED ORDER — CETYLPYRIDINIUM CHLORIDE 0.05 % MT LIQD: 7.0000 mL | Freq: Four times a day (QID) | OROMUCOSAL | Status: AC

## 2014-05-17 MED ORDER — LOPERAMIDE HCL 1 MG/5ML PO LIQD: 2.0000 mg | ORAL | Status: AC | PRN

## 2014-05-17 MED ORDER — ASPIRIN 81 MG PO TABS: 81.0000 mg | ORAL_TABLET | Freq: Every day | ORAL | Status: AC

## 2014-05-17 MED ORDER — APIXABAN 5 MG PO TABS: 5.0000 mg | ORAL_TABLET | Freq: Two times a day (BID) | ORAL | Status: AC

## 2014-05-17 MED ORDER — ENOXAPARIN SODIUM 40 MG/0.4ML ~~LOC~~ SOLN: 40.0000 mg | SUBCUTANEOUS | Status: DC

## 2014-05-17 MED ORDER — PANTOPRAZOLE SODIUM 40 MG PO PACK: 40.0000 mg | PACK | ORAL | Status: AC

## 2014-05-17 MED ORDER — FENTANYL CITRATE 0.05 MG/ML IJ SOLN: 25.0000 ug | INTRAMUSCULAR | Status: AC | PRN

## 2014-05-17 MED ORDER — CHLORHEXIDINE GLUCONATE 0.12 % MT SOLN: 15.0000 mL | Freq: Two times a day (BID) | OROMUCOSAL | Status: AC

## 2014-05-17 MED ORDER — INSULIN ASPART 100 UNIT/ML ~~LOC~~ SOLN: 0.0000 [IU] | SUBCUTANEOUS | Status: AC

## 2014-05-17 MED ORDER — ACETAMINOPHEN 650 MG RE SUPP: 650.0000 mg | Freq: Four times a day (QID) | RECTAL | Status: AC | PRN

## 2014-05-17 MED ORDER — PRO-STAT SUGAR FREE PO LIQD: 30.0000 mL | Freq: Two times a day (BID) | ORAL | Status: AC

## 2014-05-17 MED ORDER — PRO-STAT SUGAR FREE PO LIQD: 60.0000 mL | Freq: Two times a day (BID) | ORAL | Status: AC

## 2014-05-17 MED ORDER — JEVITY 1.2 CAL PO LIQD
1000.0000 mL | ORAL | Status: DC
  Administered 2014-05-17: 1000 mL
  Filled 2014-05-17 (×2): qty 1000

## 2014-05-17 MED ORDER — TERBINAFINE HCL 1 % EX CREA: TOPICAL_CREAM | Freq: Two times a day (BID) | CUTANEOUS | Status: AC

## 2014-05-17 MED ORDER — VITAL HIGH PROTEIN PO LIQD: 1000.0000 mL | ORAL | Status: AC

## 2014-05-17 MED ORDER — PRO-STAT SUGAR FREE PO LIQD
30.0000 mL | Freq: Two times a day (BID) | ORAL | Status: DC
  Filled 2014-05-17: qty 30

## 2014-05-17 MED ORDER — JEVITY 1.2 CAL PO LIQD: 1000.0000 mL | ORAL | Status: AC

## 2014-05-17 MED ORDER — METOPROLOL TARTRATE 25 MG PO TABS: 12.5000 mg | ORAL_TABLET | Freq: Two times a day (BID) | ORAL | Status: AC

## 2014-05-17 MED ORDER — ASPIRIN 325 MG PO TABS: 325.0000 mg | ORAL_TABLET | Freq: Every day | ORAL | Status: DC

## 2014-05-17 MED ORDER — FREE WATER: 300.0000 mL | Status: AC

## 2014-05-17 NOTE — Progress Notes (Signed)
ANTICOAGULATION CONSULT NOTE - Initial Consult  Pharmacy Consult for Apixaban Indication: stroke  Allergies  Allergen Reactions  . Morphine And Related     Patient Measurements: Height: 5\' 4"  (162.6 cm) Weight: 222 lb 3.6 oz (100.8 kg) IBW/kg (Calculated) : 54.7  Vital Signs: Temp: 99.8 F (37.7 C) (01/20 1545) Temp Source: Axillary (01/20 1545) BP: 100/35 mmHg (01/20 1545) Pulse Rate: 102 (01/20 1545)  Labs:  Recent Labs  05/15/14 0530 05/16/14 0535 05/23/2014 0440  HGB 8.1* 9.4* 9.0*  HCT 26.1* 30.7* 30.4*  PLT 516* 573* 452*  CREATININE 1.36* 1.50* 1.74*    Estimated Creatinine Clearance: 35.2 mL/min (by C-G formula based on Cr of 1.74).   Medical History: Past Medical History  Diagnosis Date  . Thyroid disease   . Rhabdomyolysis 04/18/2014  . Hyperosmolar non-ketotic state in patient with type 2 diabetes mellitus 04/17/2014  . AKI (acute kidney injury) 04/18/2014  . Sepsis 04/18/2014  . Atrial fibrillation 04/27/2014  . Diabetes mellitus, type 2 04/27/2014    Medications:  Scheduled:  . antiseptic oral rinse  7 mL Mouth Rinse QID  . apixaban  5 mg Per Tube BID  . aspirin  325 mg Oral Daily   Or  . aspirin  300 mg Rectal Daily  .  ceFAZolin (ANCEF) IV  2 g Intravenous Once  . chlorhexidine  15 mL Mouth Rinse BID  . feeding supplement (PRO-STAT SUGAR FREE 64)  30 mL Per Tube BID  . free water  300 mL Per Tube Q4H  . Influenza vac split quadrivalent PF  0.5 mL Intramuscular Tomorrow-1000  . insulin aspart  0-20 Units Subcutaneous 6 times per day  . metoprolol tartrate  12.5 mg Per Tube BID  . pantoprazole sodium  40 mg Per Tube Q24H  . terbinafine   Topical BID    Assessment: 69 y.o. female with afib and readmitted with L MCA. To be discharged to SNF on apixaban. SCr 1.74, Wt 100.8 kg so pt is appropriate for 5mg  po BID dosing. Hgb low but stable.  Goal of Therapy:  Prevention of stroke Monitor platelets by anticoagulation protocol: Yes    Plan:  1. Apixaban 5mg  po BID 2. Will need to f/u renal function, CBC periodically at SNF 3. Discussed with Joneen RoachPaul Hoffman, NP to decrease ASA to 81mg  and d/c Lovenox from discharge orders since pt starting on full-dose anticoagulation.  [name], PharmD, BCPS Clinical pharmacist, pager [phone] 05/10/2014,4:32 PM

## 2014-05-17 NOTE — Progress Notes (Signed)
Trauma Service Note  Subjective: G-tube had a little tension on it this AM  When examined.  Capped and partially underneath the binder.  Objective: Vital signs in last 24 hours: Temp:  [99.7 F (37.6 C)-102 F (38.9 C)] 101.6 F (38.7 C) (01/20 0402) Pulse Rate:  [67-115] 96 (01/20 0740) Resp:  [19-32] 19 (01/20 0740) BP: (94-157)/(49-71) 94/63 mmHg (01/20 0605) SpO2:  [91 %-100 %] 99 % (01/20 0740) FiO2 (%):  [28 %-35 %] 28 % (01/20 0740) Weight:  [100.8 kg (222 lb 3.6 oz)] 100.8 kg (222 lb 3.6 oz) (01/19 1817) Last BM Date: 05/15/14  Intake/Output from previous day: 01/19 0701 - 01/20 0700 In: 470 [I.V.:100; NG/GT:300] Out: 1545 [Urine:1545] Intake/Output this shift:    General: No acute distress.  Agitated.  Lungs: Clear  Abd: G-tube only partially underneath binder.    Extremities: No changes  Neuro: Stable without acute change  Lab Results: CBC   Recent Labs  05/16/14 0535 05/21/2014 0440  WBC 29.6* 27.8*  HGB 9.4* 9.0*  HCT 30.7* 30.4*  PLT 573* 452*   BMET  Recent Labs  05/16/14 0535 05/14/2014 0440  NA 156* 160*  K 3.7 3.6  CL 114* 119*  CO2 27 35*  GLUCOSE 144* 102*  BUN 62* 66*  CREATININE 1.50* 1.74*  CALCIUM 9.3 8.8   PT/INR No results for input(s): LABPROT, INR in the last 72 hours. ABG No results for input(s): PHART, HCO3 in the last 72 hours.  Invalid input(s): PCO2, PO2  Studies/Results: No results found.  Anti-infectives: Anti-infectives    Start     Dose/Rate Route Frequency Ordered Stop   05/16/14 1000  ceFAZolin (ANCEF) IVPB 2 g/50 mL premix     2 g100 mL/hr over 30 Minutes Intravenous  Once 05/15/14 1653     05/08/14 1000  vancomycin (VANCOCIN) 1,250 mg in sodium chloride 0.9 % 250 mL IVPB  Status:  Discontinued     1,250 mg166.7 mL/hr over 90 Minutes Intravenous Every 24 hours 05/07/14 1204 05/12/14 1039   05/05/14 1200  ceFEPIme (MAXIPIME) 1 g in dextrose 5 % 50 mL IVPB  Status:  Discontinued     1 g100 mL/hr over 30  Minutes Intravenous Every 24 hours 05/05/14 1039 05/12/14 1039   05/05/14 1045  vancomycin (VANCOCIN) 1,500 mg in sodium chloride 0.9 % 500 mL IVPB  Status:  Discontinued     1,500 mg250 mL/hr over 120 Minutes Intravenous Every 48 hours 05/03/14 1039 05/07/14 1204   05/03/14 2200  piperacillin-tazobactam (ZOSYN) IVPB 3.375 g  Status:  Discontinued     3.375 g12.5 mL/hr over 240 Minutes Intravenous 3 times per day 05/03/14 1039 05/05/14 1014   05/03/14 1045  piperacillin-tazobactam (ZOSYN) IVPB 3.375 g     3.375 g100 mL/hr over 30 Minutes Intravenous  Once 05/03/14 1039 05/03/14 1213   05/03/14 1045  vancomycin (VANCOCIN) 2,000 mg in sodium chloride 0.9 % 500 mL IVPB     2,000 mg250 mL/hr over 120 Minutes Intravenous  Once 05/03/14 1039 05/03/14 1350   05/02/14 1015  cefTRIAXone (ROCEPHIN) 2 g in dextrose 5 % 50 mL IVPB - Premix  Status:  Discontinued     2 g100 mL/hr over 30 Minutes Intravenous Every 24 hours 05/02/14 1006 05/03/14 1023      Assessment/Plan: s/p Procedure(s): PERCUTANEOUS ENDOSCOPIC GASTROSTOMY (PEG) PLACEMENT Advance diet Tube feedings to start today.  Protect the G-tube. Sign off.  Call us if needed.  LOS: 20 days   Marta LamasJames O. Lindie SpruceWyatt,  III, MD, FACS [phone] Trauma Surgeon 05/21/2014

## 2014-05-17 NOTE — Progress Notes (Signed)
NUTRITION FOLLOW UP  Intervention:   Change TF to better meet re-estimated energy/protein needs now that patient is off of Vent. Initiate Jevity 1.2 @ 20 ml/hr and increase by 10 ml/hr every 4 hours to goal rate of 55 ml/hr  30 ml Prostat BID.    Tube feeding regimen provides 1784 kcal (100% of needs), 103 grams of protein, and 1065 ml of H2O. Tube feeding regimen plus free water flushes (300 ml every 4 hours) provides a total of 2865 ml daily.   Nutrition Dx:   Inadequate oral intake related to AMS as evidenced by NPO status. Ongoing.  Goal:   Intake to meet >90% of estimated nutrition needs; being met  Monitor:   TF tolerance/adequacy, weight trend, labs  Assessment:   69 y.o. female with a history of atrial fibrillation and recent hospitalization for sepsis, pneumonia rhabdomyolysis as well as hypernatremia and acute kidney injury, admitted with bilateral MCA embolic infarcts.  Pt failed swallow evaluation and had a nasoenteric feeding tube placed (tip in distal duodenum near ligament of Treitz)  Pt had PEG placed on 1/19, put on Vent support for placement, now on O2 via trach. Per MD note, tube feedings to start today. No tube feedings running at time of visit. Pt is receiving 300 ml of free water via PEG every 4 hours, providing 1800 ml daily. Note, pt's weight has dropped another 16 lbs in the past 2 weeks with a total of 30 lbs lost since admission. Re-estimating energy and protein needs now that patient is off of vent support.   Low hemoglobin, high sodium, high BUN, low GFR; blood glucose ranging 85 to 322 mg/dL  Height: Ht Readings from Last 1 Encounters:  05/16/14 _0  (1.626 m)    Weight Status:   Wt Readings from Last 1 Encounters:  05/16/14 222 lb 3.6 oz (100.8 kg)  04/29/14 238 lb 04/20/14 252 lb  Re-estimated needs:  Kcal: 1750-1900 Protein: 100-120 grams Fluid: > 2 L/day  Skin: stage 2 pressure ulcer to buttocks  Diet Order: Diet NPO time  specified   Intake/Output Summary (Last 24 hours) at 04/28/2014 1340 Last data filed at 05/19/2014 1004  Gross per 24 hour  Intake    330 ml  Output   1425 ml  Net  -1095 ml    Last BM: 1/18 via rectal tube (inserted 1/8)   Labs:   Recent Labs Lab 05/15/14 0530 05/16/14 0535 05/22/2014 0440  NA 151* 156* 160*  K 3.3* 3.7 3.6  CL 117* 114* 119*  CO2 31 27 35*  BUN 52* 62* 66*  CREATININE 1.36* 1.50* 1.74*  CALCIUM 8.9 9.3 8.8  MG 2.1 2.1 2.2  PHOS 3.3 4.0 4.0  GLUCOSE 159* 144* 102*    CBG (last 3)   Recent Labs  05/13/2014 0401 05/22/2014 0820 05/13/2014 1139  GLUCAP 85 142* 130*    Scheduled Meds: . antiseptic oral rinse  7 mL Mouth Rinse QID  . aspirin  325 mg Oral Daily   Or  . aspirin  300 mg Rectal Daily  .  ceFAZolin (ANCEF) IV  2 g Intravenous Once  . chlorhexidine  15 mL Mouth Rinse BID  . enoxaparin (LOVENOX) injection  40 mg Subcutaneous Q24H  . feeding supplement (PRO-STAT SUGAR FREE 64)  60 mL Per Tube BID  . feeding supplement (VITAL HIGH PROTEIN)  1,000 mL Per Tube Q24H  . free water  300 mL Per Tube Q4H  . Influenza vac split quadrivalent PF  0.5 mL Intramuscular Tomorrow-1000  . insulin aspart  0-20 Units Subcutaneous 6 times per day  . pantoprazole sodium  40 mg Per Tube Q24H  . terbinafine   Topical BID    Continuous Infusions: . norepinephrine (LEVOPHED) Adult infusion Stopped (05/06/14 2218)  . phenylephrine (NEO-SYNEPHRINE) Adult infusion Stopped (05/11/14 1430)    Pryor Ochoa RD, LDN Inpatient Clinical Dietitian Pager: [phone] After Hours Pager: [phone]

## 2014-05-17 NOTE — Discharge Summary (Signed)
Physician Discharge Summary       Patient ID: [name] MRN: [id number] DOB/AGE: 70/08/1945 70 y.o.  Admit date: 04/27/2014 Discharge date: 05/10/2014  Discharge Diagnoses:  Principal Problem:   Hypernatremia Active Problems:   Hypokalemia   Altered mental status   Weakness   Yeast dermatitis   Atrial fibrillation   Diabetes mellitus, type 2   Dehydration   CKD (chronic kidney disease), stage III   Abnormal CT of the head   Cerebral thrombosis with cerebral infarction   Fever   Cerebral infarction due to embolism of cerebral artery   Abnormal CT of brain   Encephalopathy   Acute respiratory failure with hypoxia   Shock circulatory   UTI (urinary tract infection), bacterial   Coma   Respiratory failure, acute   Severe sepsis with septic shock   Dysphagia, pharyngoesophageal phase   Detailed Hospital Course:    Natalie Renshawlma C Jake is an 69 y.o. female with a PMH of atrial fibillation, recently hospitalized 04/17/14-04/25/14 for treatment of sepsis, pneumonia, rhabdomyolysis, and demand ischemia from afib w/ RVR. She was also hypernatremic at discharge with a sodium of 154 and a creatinine of 1.74. She was discharged on lasix. According to notes, she was back to her normal baseline mental status at discharge. She had routine labs drawn 12/31, for hospital follow up of electrolytes, and her sodium had increased to 169. Her creatinine was 1.66. Her mental status had deteriorated and she had been weak all day, according to family, who visited her at her facility to have lunch with her. The patient is very confused and unable to provide any additional information. SOB. Denies pain, shortness of breath and cough. Denies nausea and vomiting.   In ED she had CT head done for AMS which showed evidence of CVA. She was being managed for hypernatremia (thought 2nd to poor PO intake and diuretics), sepsis 2nd to HCAP, CVA for several days. 1/6 she had respiratory distress and a rapid  response was called. She was transferred to ICU. Once there she was found to be obtunded. She was intubated for airway protection and placed on the ventilator. She also required vasoactive infusions for septic/hypovolemic shock. She was started on empiric antibiotics for HCAP vs aspiration PNA. She was on pressors for 3 days before being weaned off completely. 1/10 family meeting yielded that we would attempt extubation, and if she failed we would re-intubated and pursue trach/peg, which unfortunately was the case. She had peg placed on 1/19 and was ready for discharge. At time of DC NA is 160.    Discharge Plan by active problems    Acute resp failure to encephalopathy and possible HCAP vs aspiration  -Maintain on TC as tolerated with FiO2 of 35% post procedure but place back on vent for PEG today, cuffed trach remains in place. -Titrate O2 for sat of 88-92%.  Circulatory shock - ? Due to dehydration fron high Na v sepsis v both AF-RVR (now rate controlled) P:  -Change stress dose steroids to prednisone 20 mg PO daily x3 days then 10 mg PO daily x3 days then 5 mg PO daily x3 days then D/C. -Hold further diureses for now -Start eliquis per tube 1/21. Must be suspended in 60 mL D5W  Hypernatremia Acute Renal Failure-->slightly improved  Hypokalemia  Hypernatremia - Free water to 300 ml q4 - Replace electrolytes as indicated - MAP goal > 65 mmHg - D/C lasix  Mental status precludes diet -Tube feeds since I inserted  NGT. -PPI  Anemia of critical illness -PRBC for hgb < 7gm%. -Lovenox for DVT proph.  UTI septic shock +/- HCAP -Vanc 1/9>>>1/15 -Cefepime 1/9>>>1/15  DM Relative adrenal insuff  -SSI -Stress doses steroid taper as above.  Comatose due to hypernatremia. Subacute CVA - Monitor    Significant Hospital tests/ studies  12/31 > admitted, CT head Moderate region of decreased density in the left parietal-occipital lobe concerning for subacute infarct. There is  additional questionable region of decreased density in the right temporal lobe. 1./1 - blood culture - negative 1/3 - urine culture - e colii - sensit to ceftriaxone 1/5 > Ct head Atrophy with again identified subacute infarct involving the LEFT temporoparietal region extending into the occipital lobe. 1/6 transferred to ICU for respiratory distress 05/03/14 >blood cx negative 05/03/14 > MRSA PCR - negative 1/7 > intubated, CVL placed 1/8> remains febrile, on pressors. NA, creat improving.  1/9 restarted antibiotics for HCAP vs aspiration 1/10 > off pressors, AKI and Na slowly improving. Vent weaning limited by mental status.  1/13 > family meeting to establish goals of care: Attempt extubation, if fails, will trach peg 1/14 > MRI : subacute posterior left MCA/PCA infarct with petechial hemorrhage and laminar necrosis. Fetal type left PCA origin also noted. 1/15 > re-intubated for airway protection, Trach placed (JY). Empiric ABX stopped after 7 day course. 1/17 > Panda tube placed 1/19 > PEG placed   Consults  Trauma Neurology  Discharge Exam: BP 100/35 mmHg  Pulse 102  Temp(Src) 99.8 F (37.7 C) (Axillary)  Resp 28  Ht  (1.626 m)  Wt 100.8 kg (222 lb 3.6 oz)  BMI 38.13 kg/m2  SpO2 96%  PHYSICAL EXAMINATION: General: Obese, critically ill looking, follows some commands. Trached. Neuro: Localizes and follows commands on all 4 ext but very delayed. HEENT: Trach in place, PERRL, EOM-I and +BS. Cardiovascular: Reg irreg with stable HR. Lungs: Scattered rhonchi. Abdomen: Obese, Soft, ND, NT and +BS. Musculoskeletal: Scattered skin bruises, RT Sided PICC + Skin: Intact anteriorly  Labs at discharge Lab Results  Component Value Date   CREATININE 1.74* 05/18/2014   BUN 66* 05/26/2014   NA 160* 05/10/2014   K 3.6 05/21/2014   CL 119* 05/04/2014   CO2 35* 05/02/2014   Lab Results  Component Value Date   WBC 27.8* 05/11/2014   HGB 9.0* 05/11/2014   HCT 30.4*  05/21/2014   MCV 98.4 05/24/2014   PLT 452* 05/09/2014   Lab Results  Component Value Date   ALT 24 05/07/2014   AST 57* 05/07/2014   ALKPHOS 545* 05/07/2014   BILITOT 0.3 05/07/2014   Lab Results  Component Value Date   INR 1.34 05/05/2014   INR 1.49 04/17/2014    Current radiology studies No results found.  Disposition:  03-Skilled Nursing Facility     Medication List    STOP taking these medications        aspirin 81 MG chewable tablet  Replaced by:  aspirin 81 MG tablet     diphenoxylate-atropine 2.5-0.025 MG per tablet  Commonly known as:  LOMOTIL     furosemide 20 MG tablet  Commonly known as:  LASIX     insulin glargine 100 UNIT/ML injection  Commonly known as:  LANTUS     magic mouthwash w/lidocaine Soln     ondansetron 4 MG tablet  Commonly known as:  ZOFRAN     polyethylene glycol packet  Commonly known as:  MIRALAX / GLYCOLAX  TAKE these medications        acetaminophen 325 MG tablet  Commonly known as:  TYLENOL  Take 2 tablets (650 mg total) by mouth every 6 (six) hours as needed (temp>101.5).     acetaminophen 650 MG suppository  Commonly known as:  TYLENOL  Place 1 suppository (650 mg total) rectally every 6 (six) hours as needed for fever.     antiseptic oral rinse 0.05 % Liqd solution  Commonly known as:  CPC / CETYLPYRIDINIUM CHLORIDE 0.05%  7 mLs by Mouth Rinse route QID.     apixaban 5 MG Tabs tablet  Commonly known as:  ELIQUIS  Place 1 tablet (5 mg total) into feeding tube 2 (two) times daily.     aspirin 81 MG tablet  Place 1 tablet (81 mg total) into feeding tube daily.     chlorhexidine 0.12 % solution  Commonly known as:  PERIDEX  15 mLs by Mouth Rinse route 2 (two) times daily.     feeding supplement (PRO-STAT SUGAR FREE 64) Liqd  Place 60 mLs into feeding tube 2 (two) times daily.     feeding supplement (PRO-STAT SUGAR FREE 64) Liqd  Place 30 mLs into feeding tube 2 (two) times daily.     feeding  supplement (VITAL HIGH PROTEIN) Liqd liquid  Place 1,000 mLs into feeding tube daily.     feeding supplement (JEVITY 1.2 CAL) Liqd  Place 1,000 mLs into feeding tube continuous.     fentaNYL 0.05 MG/ML injection  Commonly known as:  SUBLIMAZE  Inject 0.5-1 mLs (25-50 mcg total) into the vein every 2 (two) hours as needed (to maintain RASS goal.).     free water Soln  Place 300 mLs into feeding tube every 4 (four) hours.     insulin aspart 100 UNIT/ML injection  Commonly known as:  novoLOG  Inject 0-20 Units into the skin every 4 (four) hours.     loperamide 1 MG/5ML solution  Commonly known as:  IMODIUM  Place 10 mLs (2 mg total) into feeding tube as needed for diarrhea or loose stools.     metoprolol tartrate 25 MG tablet  Commonly known as:  LOPRESSOR  Place 0.5 tablets (12.5 mg total) into feeding tube 2 (two) times daily.     pantoprazole sodium 40 mg/20 mL Pack  Commonly known as:  PROTONIX  Place 20 mLs (40 mg total) into feeding tube daily.     predniSONE 5 MG/ML concentrated solution  As of 05/14/2014: 20 mg PO daily x3 days then 10 mg PO daily x3 days then 5 mg PO daily x3 days then D/C     terbinafine 1 % cream  Commonly known as:  LAMISIL  Apply topically 2 (two) times daily.         Discharged Condition: stable   Joneen Roach, AGACNP-BC [facility] Pager [phone] or [phone]    Levy Pupa, MD, PhD 05/22/2014, 3:28 PM [facility] [phone] or if no answer [phone]

## 2014-05-18 DIAGNOSIS — R1310 Dysphagia, unspecified: Secondary | ICD-10-CM

## 2014-05-18 DIAGNOSIS — J690 Pneumonitis due to inhalation of food and vomit: Secondary | ICD-10-CM

## 2014-05-18 DIAGNOSIS — J9620 Acute and chronic respiratory failure, unspecified whether with hypoxia or hypercapnia: Secondary | ICD-10-CM

## 2014-05-18 DIAGNOSIS — Z930 Tracheostomy status: Secondary | ICD-10-CM

## 2014-05-18 LAB — COMPREHENSIVE METABOLIC PANEL
ALBUMIN: 1.8 g/dL — AB (ref 3.5–5.2)
ALT: 74 U/L — ABNORMAL HIGH (ref 0–35)
AST: 85 U/L — AB (ref 0–37)
Alkaline Phosphatase: 1002 U/L — ABNORMAL HIGH (ref 39–117)
Anion gap: 10 (ref 5–15)
BUN: 59 mg/dL — ABNORMAL HIGH (ref 6–23)
CHLORIDE: 108 meq/L (ref 96–112)
CO2: 32 mmol/L (ref 19–32)
Calcium: 8.3 mg/dL — ABNORMAL LOW (ref 8.4–10.5)
Creatinine, Ser: 1.82 mg/dL — ABNORMAL HIGH (ref 0.50–1.10)
GFR calc Af Amer: 32 mL/min — ABNORMAL LOW (ref >90)
GFR calc non Af Amer: 27 mL/min — ABNORMAL LOW (ref >90)
Glucose, Bld: 197 mg/dL — ABNORMAL HIGH (ref 70–99)
POTASSIUM: 3.5 mmol/L (ref 3.5–5.1)
Sodium: 150 mmol/L — ABNORMAL HIGH (ref 135–145)
Total Bilirubin: 0.9 mg/dL (ref 0.3–1.2)
Total Protein: 5.9 g/dL — ABNORMAL LOW (ref 6.0–8.3)

## 2014-05-18 LAB — CBC
HEMATOCRIT: 28.6 % — AB (ref 36.0–46.0)
HEMOGLOBIN: 8.7 g/dL — AB (ref 12.0–15.0)
MCH: 30.4 pg (ref 26.0–34.0)
MCHC: 30.4 g/dL (ref 30.0–36.0)
MCV: 100 fL (ref 78.0–100.0)
PLATELETS: 368 10*3/uL (ref 150–400)
RBC: 2.86 MIL/uL — ABNORMAL LOW (ref 3.87–5.11)
RDW: 18.6 % — AB (ref 11.5–15.5)
WBC: 29.4 10*3/uL — ABNORMAL HIGH (ref 4.0–10.5)

## 2014-05-18 LAB — TSH: TSH: 3.979 u[IU]/mL (ref 0.350–4.500)

## 2014-05-18 LAB — CLOSTRIDIUM DIFFICILE BY PCR: Toxigenic C. Difficile by PCR: NEGATIVE

## 2014-05-18 NOTE — Consult Note (Signed)
Name: [name] MRN: [id number] DOB: 06/30/1945    ADMISSION DATE:  05/08/2014 CONSULTATION DATE:  1/21  REFERRING MD :  Sharyon MedicusHijazi ([facility])  CHIEF COMPLAINT:  Respiratory failure  BRIEF PATIENT DESCRIPTION:  69 yo [facility] with Lt MCA CVA, UTI, hypernatremia.  Developed respiratory failure from HCAP and required tracheostomy.  [facility].  PCCM consulted to assist with trach/respiratory management.   SIGNIFICANT EVENTS: 12/31 [facility] 1/3 urine culture - e colii - sensit to ceftriaxone 1/6 transferred to ICU for respiratory distress 1/7 intubated, CVL placed 1/8 remains febrile, on pressors. NA, creat improving.  1/9 restarted antibiotics for HCAP vs aspiration 1/10 off pressors, AKI and Na slowly improving. Vent weaning limited by mental status.  1/13 family meeting to establish goals of care: Attempt extubation, if fails, will trach peg 1/15 re-intubated for airway protection, Trach placed (JY). Empiric ABX stopped after 7 day course. 1/19 PEG placed    STUDIES: 12/31 CT head >>Moderate region of decreased density in the left parietal-occipital lobe concerning for subacute infarct 1/5 CT head >> atrophy, subacute infarct Lt temporoparietal region to occipital lobe 1/14 MRI brain >> subacute posterior left MCA/PCA infarct   HISTORY OF PRESENT ILLNESS:  69 yo female with hx AFib initially admitted 12/21-12/25 to [facility] with sepsis r/t CAP, Afib with RVR and related NSTEMI, HHNK and rhabdo with AKI.  She was d/c to SNF and readmitted 12/31 with hypernatremia (Na 162), UTI and acute L MCA stroke.  On 1/6 she developed worsening respiratory distress and AMS requiring intubation. She was treated for HCAP with septic shock and ultimately required trach after failed attempt at extubation.  She underwent trach placement 1/15, PEG placed 1/19.  She quickly tolerated trach collar trials and [facility] 1[facility].  P[facility]M consulted on [facility] for  assist with vent/weaning efforts.   PAST MEDICAL HISTORY :   has a past medical history of Thyroid disease; Rhabdomyolysis (04/18/2014); Hyperosmolar non-ketotic state in patient with type 2 diabetes mellitus (04/17/2014); AKI (acute kidney injury) (04/18/2014); Sepsis (04/18/2014); Atrial fibrillation (04/27/2014); and Diabetes mellitus, type 2 (04/27/2014).  has past surgical history that includes Cholecystectomy and PEG placement (N/A, 05/16/2014). Prior to Admission medications   Medication Sig Start Date End Date Taking? Authorizing Provider  acetaminophen (TYLENOL) 325 MG tablet Take 2 tablets (650 mg total) by mouth every 6 (six) hours as needed (temp>101.5). 05/06/2014   Bernadene PersonKathryn A Whiteheart, NP  acetaminophen (TYLENOL) 650 MG suppository Place 1 suppository (650 mg total) rectally every 6 (six) hours as needed for fever. 05/18/2014   Bernadene PersonKathryn A Whiteheart, NP  Amino Acids-Protein Hydrolys (FEEDING SUPPLEMENT, PRO-STAT SUGAR FREE 64,) LIQD Place 60 mLs into feeding tube 2 (two) times daily. 04/29/2014   Bernadene PersonKathryn A Whiteheart, NP  Amino Acids-Protein Hydrolys (FEEDING SUPPLEMENT, PRO-STAT SUGAR FREE 64,) LIQD Place 30 mLs into feeding tube 2 (two) times daily. 05/28/2014   Duayne CalPaul W Hoffman, NP  antiseptic oral rinse (CPC / CETYLPYRIDINIUM CHLORIDE 0.05%) 0.05 % LIQD solution 7 mLs by Mouth Rinse route QID. 05/15/2014   Bernadene PersonKathryn A Whiteheart, NP  apixaban (ELIQUIS) 5 MG TABS tablet Place 1 tablet (5 mg total) into feeding tube 2 (two) times daily. 05/14/2014   Duayne CalPaul W Hoffman, NP  aspirin 81 MG tablet Place 1 tablet (81 mg total) into feeding tube daily. 05/14/2014   Duayne CalPaul W Hoffman, NP  chlorhexidine (PERIDEX) 0.12 % solution 15 mLs by Mouth Rinse route 2 (  two) times daily. 05/18/2014   Bernadene Person, NP  fentaNYL (SUBLIMAZE) 0.05 MG/ML injection Inject 0.5-1 mLs (25-50 mcg total) into the vein every 2 (two) hours as needed (to maintain RASS goal.). 05/18/2014   Bernadene Person, NP  insulin aspart (NOVOLOG) 100  UNIT/ML injection Inject 0-20 Units into the skin every 4 (four) hours. 05/28/2014   Bernadene Person, NP  loperamide (IMODIUM) 1 MG/5ML solution Place 10 mLs (2 mg total) into feeding tube as needed for diarrhea or loose stools. 05/25/2014   Bernadene Person, NP  metoprolol tartrate (LOPRESSOR) 25 MG tablet Place 0.5 tablets (12.5 mg total) into feeding tube 2 (two) times daily. 05/11/2014   Duayne Cal, NP  Nutritional Supplements (FEEDING SUPPLEMENT, JEVITY 1.2 CAL,) LIQD Place 1,000 mLs into feeding tube continuous. 05/21/2014   Duayne Cal, NP  Nutritional Supplements (FEEDING SUPPLEMENT, VITAL HIGH PROTEIN,) LIQD liquid Place 1,000 mLs into feeding tube daily. 05/26/2014   Bernadene Person, NP  pantoprazole sodium (PROTONIX) 40 mg/20 mL PACK Place 20 mLs (40 mg total) into feeding tube daily. 05/15/2014   Bernadene Person, NP  predniSONE 5 MG/ML concentrated solution As of 05/14/2014: 20 mg PO daily x3 days then 10 mg PO daily x3 days then 5 mg PO daily x3 days then D/C 05/26/2014   Bernadene Person, NP  terbinafine (LAMISIL) 1 % cream Apply topically 2 (two) times daily. 05/12/2014   Bernadene Person, NP  Water For Irrigation, Sterile (FREE WATER) SOLN Place 300 mLs into feeding tube every 4 (four) hours. 05/11/2014   Bernadene Person, NP   Allergies  Allergen Reactions  . Morphine And Related     FAMILY HISTORY:  family history includes Cancer in her brother, father, mother, and sister; Diabetes in her mother; Heart failure in her mother; Hypertension in her mother; Osteoarthritis in her brother, father, mother, and sister. SOCIAL HISTORY:  reports that she has never smoked. She does not have any smokeless tobacco history on file. She reports that she does not drink alcohol or use illicit drugs.  REVIEW OF SYSTEMS:   Difficult to obtain.  As per HPI.   SUBJECTIVE:   VITAL SIGNS: Temp:  [99.8 F (37.7 C)-100.5 F (38.1 C)] 99.8 F (37.7 C) (01/20 1545) Pulse Rate:  [98-149]  102 (01/20 1545) Resp:  [22-28] 28 (01/20 1545) BP: (100-112)/(35-62) 100/35 mmHg (01/20 1545) SpO2:  [96 %-97 %] 96 % (01/20 1545) FiO2 (%):  [28 %] 28 % (01/20 1545)  PHYSICAL EXAMINATION: General: Ill appearing Neuro: Opens eyes with stimulation, non verbal, does not follow commands HEENT: trach site clean with clear secretions Cardiovascular: regular, no murmur Lungs: scattered rhonchi Abdomen: PEG site clean with abd binder in place Musculoskeletal: no edema Skin: no rashes   Recent Labs Lab 05/16/14 0535 05/23/2014 0440 05/18/14 0500  NA 156* 160* 150*  K 3.7 3.6 3.5  CL 114* 119* 108  CO2 27 35* 32  BUN 62* 66* 59*  CREATININE 1.50* 1.74* 1.82*  GLUCOSE 144* 102* 197*    Recent Labs Lab 05/16/14 0535 04/30/2014 0440 05/18/14 0500  HGB 9.4* 9.0* 8.7*  HCT 30.7* 30.4* 28.6*  WBC 29.6* 27.8* 29.4*  PLT 573* 452* 368   Dg Chest Port 1 View  05/12/2014   CLINICAL DATA:  Pneumonia  EXAM: PORTABLE CHEST - 1 VIEW  COMPARISON:  05/12/2014  FINDINGS: Stable positioning of right upper extremity PICC, tip at the SVC. The tracheostomy tube remains well seated.  A feeding tube has been removed.  A right base opacity obscuring the diaphragm persists. No edema, effusion, or pneumothorax.  IMPRESSION: Hypoaeration with unchanged right basilar atelectasis or pneumonia.   Electronically Signed   By: Tiburcio Pea M.D.   On: 05/14/2014 22:44   Dg Abd Portable 1v  05/24/2014   CLINICAL DATA:  Peg tube placement.  EXAM: PORTABLE ABDOMEN - 1 VIEW  COMPARISON:  05/14/2014  FINDINGS: A percutaneous gastrostomy tube projects over the left abdomen. There is no evidence of bowel obstruction. Streaky lower lung opacities consistent with atelectasis.  IMPRESSION: PEG tube overlaps the left upper abdomen. If needed, contrast could be injected to confirm intraluminal placement.   Electronically Signed   By: Tiburcio Pea M.D.   On: 05/24/2014 22:05    ASSESSMENT / PLAN:  Acute trach dependent  respiratory failure r/t encephalopathy and possible HCAP vs aspiration.  Plan -  Continue ATC as tolerated  Continue cuffed trach for now  Not candidate for decannulation r/t mental status  S/p course abx  Supplemental O2 as needed  Intermittent f/u CXR  Hold further diuresis for now   Hypernatremia  AFib RVR  Acute renal failure  UTI DM  MCA stroke  Plan -  Per [facility] MD  eliquis per pharmacy    Dirk Dress, NP 05/18/2014  9:21 AM Pager: [phone] or [phone]   Reviewed above, examined.  69 yo female with CVA, hypernatremia, PNA ?aspiration, VDRF s/p trach.  She is not able to provide hx.  She is not needing vent, but is still needing trach until mental status and airway protection improve.  She has b/l rhonchi on exam, trach site clean.  Will continue trach collar as tolerated.  F/u CXR intermittently.  Coralyn Helling, MD [facility]/Critical Care 05/18/2014, 10:52 AM Pager:  [phone] After 3pm call: [phone]

## 2014-05-19 LAB — URINE CULTURE

## 2014-05-19 LAB — CBC
HCT: 25.3 % — ABNORMAL LOW (ref 36.0–46.0)
Hemoglobin: 8 g/dL — ABNORMAL LOW (ref 12.0–15.0)
MCH: 29.5 pg (ref 26.0–34.0)
MCHC: 31.6 g/dL (ref 30.0–36.0)
MCV: 93.4 fL (ref 78.0–100.0)
Platelets: 277 10*3/uL (ref 150–400)
RBC: 2.71 MIL/uL — AB (ref 3.87–5.11)
RDW: 16.7 % — ABNORMAL HIGH (ref 11.5–15.5)
WBC: 25.7 10*3/uL — ABNORMAL HIGH (ref 4.0–10.5)

## 2014-05-19 LAB — OSMOLALITY, URINE: Osmolality, Ur: 379 mOsm/kg — ABNORMAL LOW (ref 390–1090)

## 2014-05-19 LAB — OSMOLALITY: Osmolality: 297 mOsm/kg (ref 275–300)

## 2014-05-19 LAB — BASIC METABOLIC PANEL
Anion gap: 8 (ref 5–15)
BUN: 50 mg/dL — AB (ref 6–23)
CO2: 31 mmol/L (ref 19–32)
Calcium: 8 mg/dL — ABNORMAL LOW (ref 8.4–10.5)
Chloride: 94 mEq/L — ABNORMAL LOW (ref 96–112)
Creatinine, Ser: 1.65 mg/dL — ABNORMAL HIGH (ref 0.50–1.10)
GFR calc Af Amer: 36 mL/min — ABNORMAL LOW (ref >90)
GFR calc non Af Amer: 31 mL/min — ABNORMAL LOW (ref >90)
Glucose, Bld: 286 mg/dL — ABNORMAL HIGH (ref 70–99)
POTASSIUM: 3.7 mmol/L (ref 3.5–5.1)
Sodium: 133 mmol/L — ABNORMAL LOW (ref 135–145)

## 2014-05-19 LAB — SODIUM, URINE, RANDOM: Sodium, Ur: 12 mmol/L

## 2014-05-19 NOTE — Addendum Note (Signed)
Addendum  created 05/19/14 0949 by Judie Petitharlene Edwards, MD   Modules edited: Anesthesia Attestations

## 2014-05-20 LAB — BASIC METABOLIC PANEL
Anion gap: 13 (ref 5–15)
BUN: 49 mg/dL — ABNORMAL HIGH (ref 6–23)
CALCIUM: 8.5 mg/dL (ref 8.4–10.5)
CHLORIDE: 94 mmol/L — AB (ref 96–112)
CO2: 25 mmol/L (ref 19–32)
Creatinine, Ser: 1.47 mg/dL — ABNORMAL HIGH (ref 0.50–1.10)
GFR, EST AFRICAN AMERICAN: 41 mL/min — AB (ref >90)
GFR, EST NON AFRICAN AMERICAN: 35 mL/min — AB (ref >90)
GLUCOSE: 272 mg/dL — AB (ref 70–99)
POTASSIUM: 3.5 mmol/L (ref 3.5–5.1)
Sodium: 132 mmol/L — ABNORMAL LOW (ref 135–145)

## 2014-05-20 LAB — CULTURE, BLOOD (ROUTINE X 2)

## 2014-05-22 ENCOUNTER — Other Ambulatory Visit (HOSPITAL_COMMUNITY): Payer: Self-pay

## 2014-05-22 DIAGNOSIS — J9621 Acute and chronic respiratory failure with hypoxia: Secondary | ICD-10-CM

## 2014-05-22 NOTE — Consult Note (Signed)
Name: [name] MRN: [id number] DOB: 08-17-1945    ADMISSION DATE:  June 12, 2014 CONSULTATION DATE:  1/21  REFERRING MD :  Sharyon Medicus ([facility])  CHIEF COMPLAINT:  Respiratory failure  BRIEF PATIENT DESCRIPTION:  69 yo female admitted to [facility] with Lt MCA CVA, UTI, hypernatremia.  Developed respiratory failure from HCAP and required tracheostomy.  She was transferred to [facility] for rehab.  PCCM consulted to assist with trach/respiratory management.   SIGNIFICANT EVENTS: 12/31 admitted to [facility] 1/3 urine culture - e colii - sensit to ceftriaxone 1/6 transferred to ICU for respiratory distress 1/7 intubated, CVL placed 1/8 remains febrile, on pressors. NA, creat improving.  1/9 restarted antibiotics for HCAP vs aspiration 1/10 off pressors, AKI and Na slowly improving. Vent weaning limited by mental status.  1/13 family meeting to establish goals of care: Attempt extubation, if fails, will trach peg 1/15 re-intubated for airway protection, Trach placed (JY). Empiric ABX stopped after 7 day course. 1/19 PEG placed    STUDIES: 12/31 CT head >>Moderate region of decreased density in the left parietal-occipital lobe concerning for subacute infarct 1/5 CT head >> atrophy, subacute infarct Lt temporoparietal region to occipital lobe 1/14 MRI brain >> subacute posterior left MCA/PCA infarct   HISTORY OF PRESENT ILLNESS:  69 yo female with hx AFib initially admitted 12/21-12/25 to [facility] with sepsis r/t CAP, Afib with RVR and related NSTEMI, HHNK and rhabdo with AKI.  She was d/c to SNF and readmitted 12/31 with hypernatremia (Na 162), UTI and acute L MCA stroke.  On 1/6 she developed worsening respiratory distress and AMS requiring intubation. She was treated for HCAP with septic shock and ultimately required trach after failed attempt at extubation.  She underwent trach placement 1/15, PEG placed 1/19.  She quickly tolerated trach collar trials and was [facility] 1[facility].  PCCM consulted on [facility] for  assist with vent/weaning efforts.     SUBJECTIVE:  On t collar VITAL SIGNS: Vital signs reviewed. Abnormal values will appear under impression plan section.    PHYSICAL EXAMINATION: General: Ill appearing Neuro: Opens eyes with stimulation, non verbal, does not follow commands HEENT: trach site clean with clear secretions Cardiovascular: regular, no murmur Lungs: scattered rhonchi, on t collar currently Abdomen: PEG site clean with abd binder in place Musculoskeletal: no edema Skin: no rashes   Recent Labs Lab 05/18/14 0500 05/19/14 0500 05/20/14 0608  NA 150* 133* 132*  K 3.5 3.7 3.5  CL 108 94* 94*  CO2 32 31 25  BUN 59* 50* 49*  CREATININE 1.82* 1.65* 1.47*  GLUCOSE 197* 286* 272*    Recent Labs Lab 06-12-14 0440 05/18/14 0500 05/19/14 0500  HGB 9.0* 8.7* 8.0*  HCT 30.4* 28.6* 25.3*  WBC 27.8* 29.4* 25.7*  PLT 452* 368 277   No results found.  ASSESSMENT / PLAN:  Acute trach dependent respiratory failure r/t encephalopathy and possible HCAP vs aspiration.  Plan -  Continue ATC as tolerated  Continue cuffed trach for now  Not candidate for decannulation r/t mental status  S/p course abx  Supplemental O2 as needed  Intermittent f/u CXR  Hold further diuresis for now   Hypernatremia  AFib RVR  Acute renal failure  UTI DM  MCA stroke  Plan -  Per [facility] MD  eliquis per pharmacy   Brett Canales Minor ACNP [facility] PCCM Pager [phone] till 3 pm If no answer page [phone]  Maintain on TC as tolerated, would not decannulate given mental status.  Supplement  O2 as needed.  Will defer anti-coag to [facility]-MD.  Will need a trach SNF.  Patient seen and examined, agree with above note.  I dictated the care and orders written for this patient under my direction.  Alyson ReedyWesam G Ulyess Muto, MD [phone]  05/22/2014, 10:02 AM

## 2014-05-23 LAB — VANCOMYCIN, TROUGH: Vancomycin Tr: 18 ug/mL (ref 10.0–20.0)

## 2014-05-24 LAB — BASIC METABOLIC PANEL
Anion gap: 5 (ref 5–15)
BUN: 41 mg/dL — AB (ref 6–23)
CHLORIDE: 101 mmol/L (ref 96–112)
CO2: 32 mmol/L (ref 19–32)
CREATININE: 1.13 mg/dL — AB (ref 0.50–1.10)
Calcium: 8.3 mg/dL — ABNORMAL LOW (ref 8.4–10.5)
GFR calc Af Amer: 56 mL/min — ABNORMAL LOW (ref >90)
GFR calc non Af Amer: 48 mL/min — ABNORMAL LOW (ref >90)
GLUCOSE: 238 mg/dL — AB (ref 70–99)
POTASSIUM: 3.3 mmol/L — AB (ref 3.5–5.1)
Sodium: 138 mmol/L (ref 135–145)

## 2014-05-24 LAB — CULTURE, BLOOD (ROUTINE X 2): Culture: NO GROWTH

## 2014-05-24 LAB — CBC
HCT: 24.6 % — ABNORMAL LOW (ref 36.0–46.0)
HEMOGLOBIN: 7.9 g/dL — AB (ref 12.0–15.0)
MCH: 29.9 pg (ref 26.0–34.0)
MCHC: 32.1 g/dL (ref 30.0–36.0)
MCV: 93.2 fL (ref 78.0–100.0)
Platelets: 152 10*3/uL (ref 150–400)
RBC: 2.64 MIL/uL — ABNORMAL LOW (ref 3.87–5.11)
RDW: 18.5 % — ABNORMAL HIGH (ref 11.5–15.5)
WBC: 7.9 10*3/uL (ref 4.0–10.5)

## 2014-05-24 NOTE — Progress Notes (Signed)
   Name: [name] MRN: [id number] DOB: 01/20/1946    ADMISSION DATE:  11-26-14 CONSULTATION DATE:  1/21  REFERRING MD :  Sharyon MedicusHijazi ([facility])  CHIEF COMPLAINT:  Respiratory failure  BRIEF PATIENT DESCRIPTION:  69 [facility] with [facility] MCA CVA, UTI, hypernatremia.  Developed respiratory failure from HCAP and required tracheostomy.  [facility]  PCCM consulted to assist with trach/respiratory management.   SIGNIFICANT EVENTS: 12/31 [facility] 1/3 urine culture - e colii - sensit to ceftriaxone 1/6 transferred to ICU for respiratory distress 1/7 intubated, CVL placed 1/8 remains febrile, on pressors. NA, creat improving.  1/9 restarted antibiotics for HCAP vs aspiration 1/10 off pressors, AKI and Na slowly improving. Vent weaning limited by mental status.  1/13 family meeting to establish goals of care: Attempt extubation, if fails, will trach peg 1/15 re-intubated for airway protection, Trach placed (JY). Empiric ABX stopped after 7 day course. 1/19 PEG placed    STUDIES: 12/31 CT head >>Moderate region of decreased density in the left parietal-occipital lobe concerning for subacute infarct 1/5 CT head >> atrophy, subacute infarct Lt temporoparietal region to occipital lobe 1/14 MRI brain >> subacute posterior left MCA/PCA infarct  SUBJECTIVE:  On t collar  VITAL SIGNS: Vital signs reviewed. Abnormal values will appear under impression plan section.   PHYSICAL EXAMINATION: General: Ill appearing Neuro: Opens eyes with stimulation, non verbal, does not follow commands HEENT: trach site clean with clear secretions Cardiovascular: regular, no murmur Lungs: scattered rhonchi, on t collar currently Abdomen: PEG site clean with abd binder in place Musculoskeletal: no edema Skin: no rashes  Recent Labs Lab 05/19/14 0500 05/20/14 0608 05/24/14 0630  NA 133* 132* 138  K 3.7 3.5 3.3*  CL 94* 94* 101  CO2 31 25 32  BUN 50* 49* 41*    CREATININE 1.65* 1.47* 1.13*  GLUCOSE 286* 272* 238*   Recent Labs Lab 05/18/14 0500 05/19/14 0500 05/24/14 0630  HGB 8.7* 8.0* 7.9*  HCT 28.6* 25.3* 24.6*  WBC 29.4* 25.7* 7.9  PLT 368 277 152   No results found.  ASSESSMENT / PLAN:  Acute trach dependent respiratory failure r/t encephalopathy and possible HCAP vs aspiration.  Plan -  Continue ATC as tolerated  Continue cuffed trach for now  Not candidate for decannulation r/t mental status  S/p course abx  Supplemental O2 as needed  Intermittent f/u CXR   Hypernatremia  AFib RVR  Acute renal failure  UTI DM  MCA stroke  Plan -  Per [facility] MD  eliquis per pharmacy   Will continue TC 24/7, never decannulate given neuro condition.   Alyson ReedyWesam G. Yacoub, M.D. [facility]. Pager: [phone]. After hours pager: [phone].  05/24/2014, 2:23 PM

## 2014-05-25 LAB — CBC
HCT: 26.4 % — ABNORMAL LOW (ref 36.0–46.0)
Hemoglobin: 8.3 g/dL — ABNORMAL LOW (ref 12.0–15.0)
MCH: 29.5 pg (ref 26.0–34.0)
MCHC: 31.4 g/dL (ref 30.0–36.0)
MCV: 94 fL (ref 78.0–100.0)
Platelets: 147 10*3/uL — ABNORMAL LOW (ref 150–400)
RBC: 2.81 MIL/uL — ABNORMAL LOW (ref 3.87–5.11)
RDW: 18.5 % — ABNORMAL HIGH (ref 11.5–15.5)
WBC: 2 10*3/uL — AB (ref 4.0–10.5)

## 2014-05-26 ENCOUNTER — Other Ambulatory Visit (HOSPITAL_COMMUNITY): Payer: Medicare Other

## 2014-05-26 LAB — CBC WITH DIFFERENTIAL/PLATELET
BASOS PCT: 0 % (ref 0–1)
Basophils Absolute: 0 10*3/uL (ref 0.0–0.1)
EOS ABS: 0 10*3/uL (ref 0.0–0.7)
EOS PCT: 0 % (ref 0–5)
HCT: 25.9 % — ABNORMAL LOW (ref 36.0–46.0)
Hemoglobin: 8 g/dL — ABNORMAL LOW (ref 12.0–15.0)
Lymphocytes Relative: 23 % (ref 12–46)
Lymphs Abs: 0.4 10*3/uL — ABNORMAL LOW (ref 0.7–4.0)
MCH: 28.9 pg (ref 26.0–34.0)
MCHC: 30.9 g/dL (ref 30.0–36.0)
MCV: 93.5 fL (ref 78.0–100.0)
Monocytes Absolute: 0.2 10*3/uL (ref 0.1–1.0)
Monocytes Relative: 9 % (ref 3–12)
NEUTROS ABS: 1.3 10*3/uL — AB (ref 1.7–7.7)
NEUTROS PCT: 68 % (ref 43–77)
Platelets: 165 10*3/uL (ref 150–400)
RBC: 2.77 MIL/uL — AB (ref 3.87–5.11)
RDW: 18.5 % — ABNORMAL HIGH (ref 11.5–15.5)
WBC: 1.9 10*3/uL — AB (ref 4.0–10.5)

## 2014-05-26 LAB — URINALYSIS, ROUTINE W REFLEX MICROSCOPIC
BILIRUBIN URINE: NEGATIVE
Glucose, UA: NEGATIVE mg/dL
Ketones, ur: 15 mg/dL — AB
LEUKOCYTES UA: NEGATIVE
Nitrite: NEGATIVE
Protein, ur: 100 mg/dL — AB
SPECIFIC GRAVITY, URINE: 1.03 (ref 1.005–1.030)
UROBILINOGEN UA: 0.2 mg/dL (ref 0.0–1.0)
pH: 5 (ref 5.0–8.0)

## 2014-05-26 LAB — URINE MICROSCOPIC-ADD ON

## 2014-05-27 LAB — CBC WITH DIFFERENTIAL/PLATELET
BASOS ABS: 0 10*3/uL (ref 0.0–0.1)
BASOS PCT: 1 % (ref 0–1)
EOS PCT: 1 % (ref 0–5)
Eosinophils Absolute: 0 10*3/uL (ref 0.0–0.7)
HEMATOCRIT: 27.3 % — AB (ref 36.0–46.0)
Hemoglobin: 8.3 g/dL — ABNORMAL LOW (ref 12.0–15.0)
Lymphocytes Relative: 48 % — ABNORMAL HIGH (ref 12–46)
Lymphs Abs: 0.7 10*3/uL (ref 0.7–4.0)
MCH: 29.3 pg (ref 26.0–34.0)
MCHC: 30.4 g/dL (ref 30.0–36.0)
MCV: 96.5 fL (ref 78.0–100.0)
Monocytes Absolute: 0.2 10*3/uL (ref 0.1–1.0)
Monocytes Relative: 16 % — ABNORMAL HIGH (ref 3–12)
NEUTROS PCT: 34 % — AB (ref 43–77)
Neutro Abs: 0.4 10*3/uL — ABNORMAL LOW (ref 1.7–7.7)
PLATELETS: 197 10*3/uL (ref 150–400)
RBC: 2.83 MIL/uL — AB (ref 3.87–5.11)
RDW: 19 % — ABNORMAL HIGH (ref 11.5–15.5)
WBC: 1.3 10*3/uL — CL (ref 4.0–10.5)

## 2014-05-27 LAB — CBC
HEMATOCRIT: 27.9 % — AB (ref 36.0–46.0)
HEMOGLOBIN: 8.5 g/dL — AB (ref 12.0–15.0)
MCH: 29.5 pg (ref 26.0–34.0)
MCHC: 30.5 g/dL (ref 30.0–36.0)
MCV: 96.9 fL (ref 78.0–100.0)
Platelets: 223 10*3/uL (ref 150–400)
RBC: 2.88 MIL/uL — AB (ref 3.87–5.11)
RDW: 19.2 % — ABNORMAL HIGH (ref 11.5–15.5)
WBC: 4.5 10*3/uL (ref 4.0–10.5)

## 2014-05-27 LAB — POTASSIUM: Potassium: >7.5 mmol/L (ref 3.5–5.1)

## 2014-05-28 LAB — URINE CULTURE: Colony Count: 80000

## 2014-05-28 MED FILL — Medication: Qty: 1 | Status: AC

## 2014-05-29 LAB — CULTURE, BLOOD (ROUTINE X 2)
Culture: NO GROWTH
Culture: NO GROWTH

## 2014-05-29 DEATH — deceased

## 2014-05-31 LAB — CULTURE, BLOOD (ROUTINE X 2)
Culture: NO GROWTH
Culture: NO GROWTH

## 2016-09-05 IMAGING — DX DG TIBIA/FIBULA 2V*L*
3 series · 3 of 3 positions shown · non-contrast
Comparison: None.

CLINICAL DATA: Status post fall; acute onset of left lower leg
pain. Initial encounter.

EXAM:
LEFT TIBIA AND FIBULA - 2 VIEW

[tibia ap (1 of 2)]
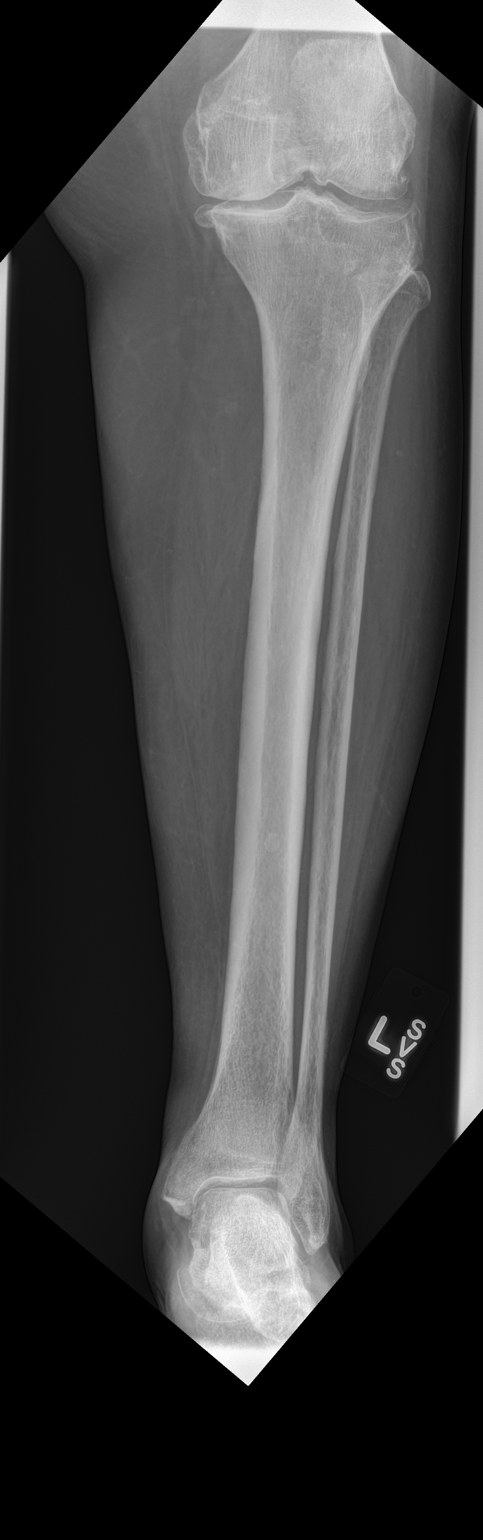

[tibia ap (2 of 2)]
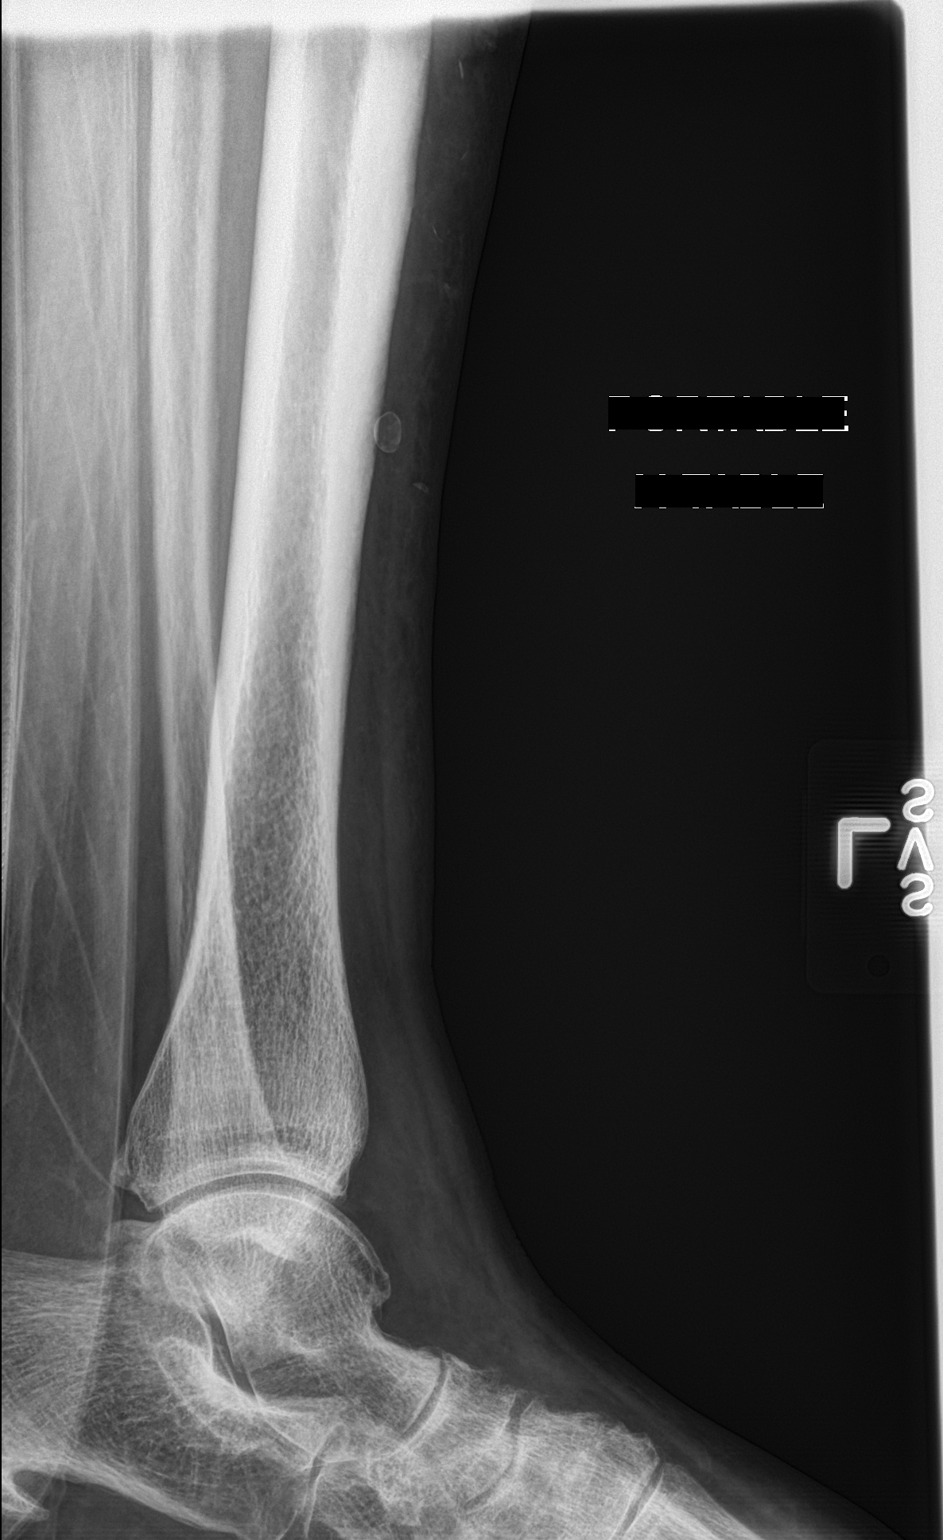

[tibia lat]
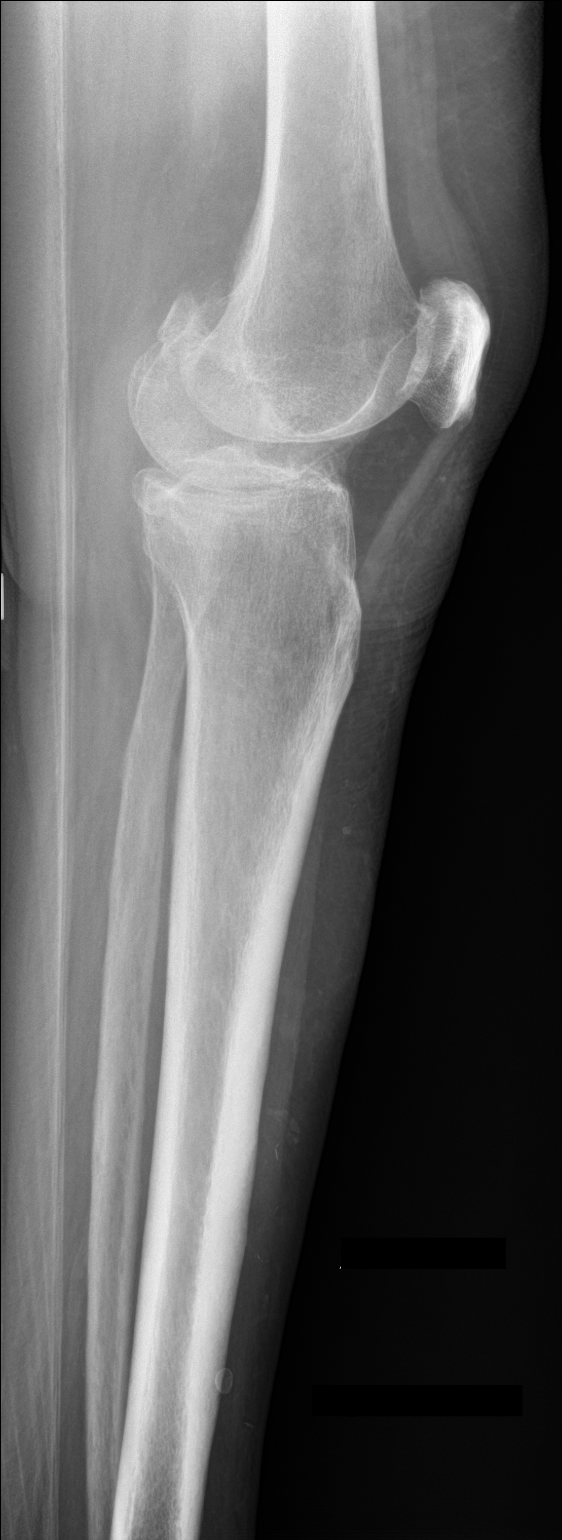

[3 of 3 positions shown; findings below may reference images not displayed]

FINDINGS: There is no evidence of fracture or dislocation. The tibia and
fibula appear intact.

Marginal osteophytes are seen arising at the medial and lateral
compartments of the knee, with wall osteophytes also seen. No knee
joint effusion is identified.

The ankle mortise is incompletely assessed, but appears grossly
unremarkable. A plantar calcaneal spur is incidentally noted. No
significant soft tissue abnormalities are identified.
IMPRESSION: No evidence of fracture or dislocation.

## 2016-09-05 IMAGING — CR DG LUMBAR SPINE 2-3V
4 series · 4 of 4 positions shown · non-contrast
Comparison: None.

CLINICAL DATA: Fall.  Weakness.  Initial evaluation.

EXAM:
LUMBAR SPINE - 2-3 VIEW

[t lumbar spine ap (1 of 2)]
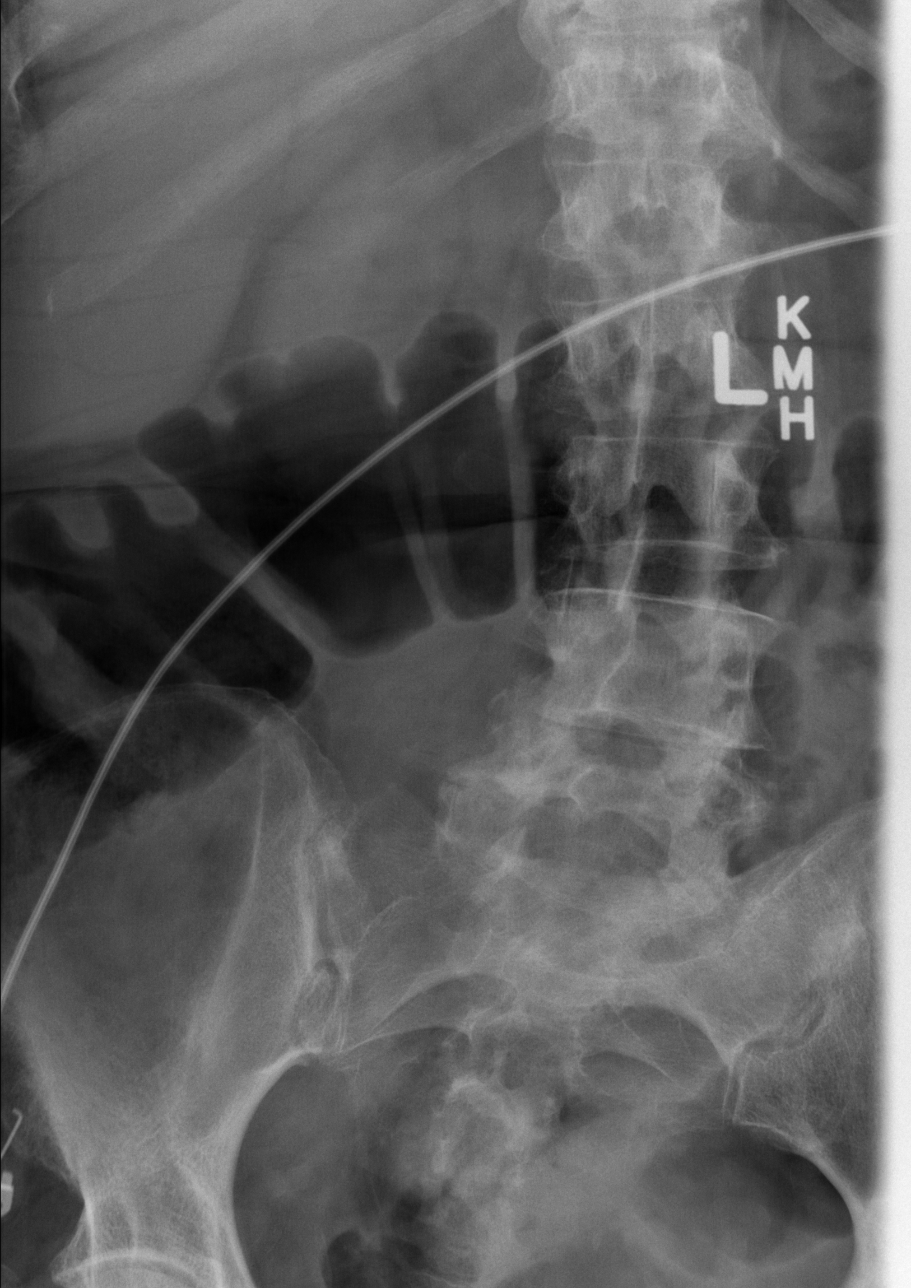

[t lumbar spine ap (2 of 2)]
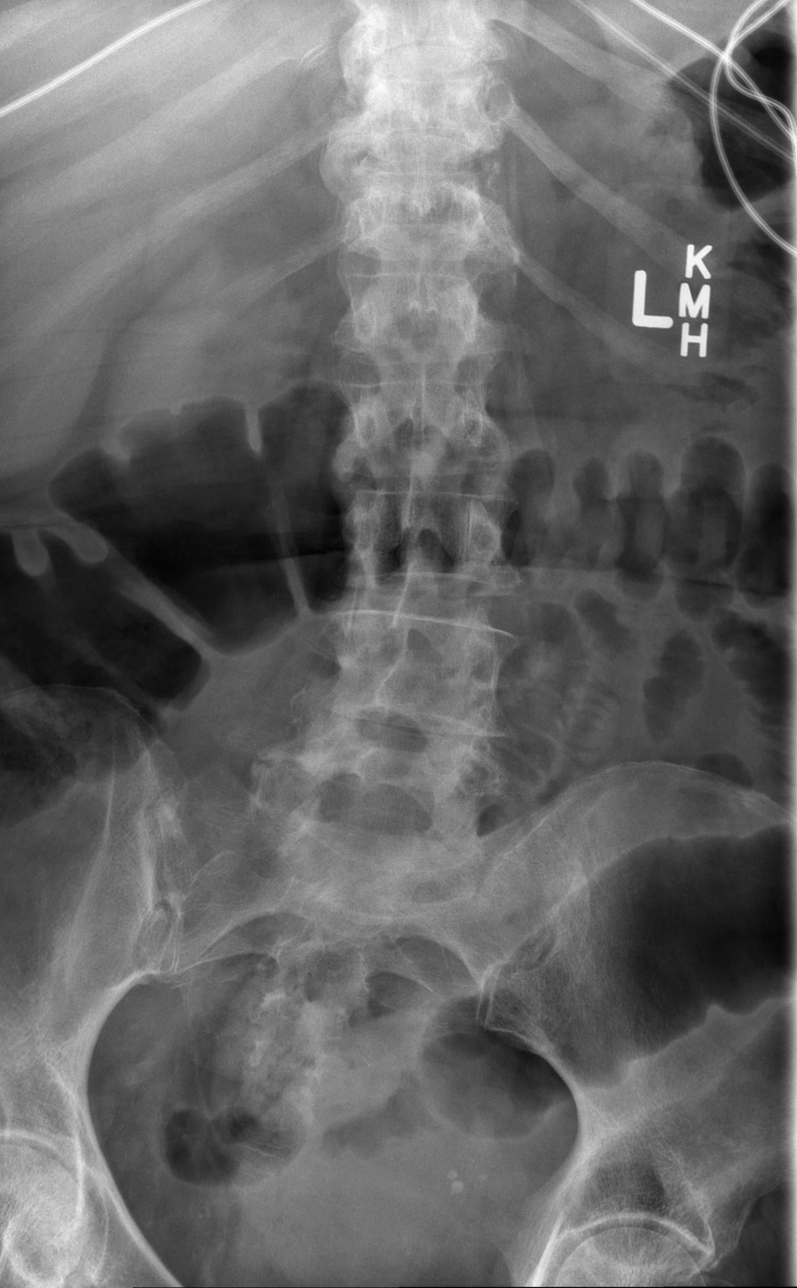

[w lumbar spine lat (1 of 2)]
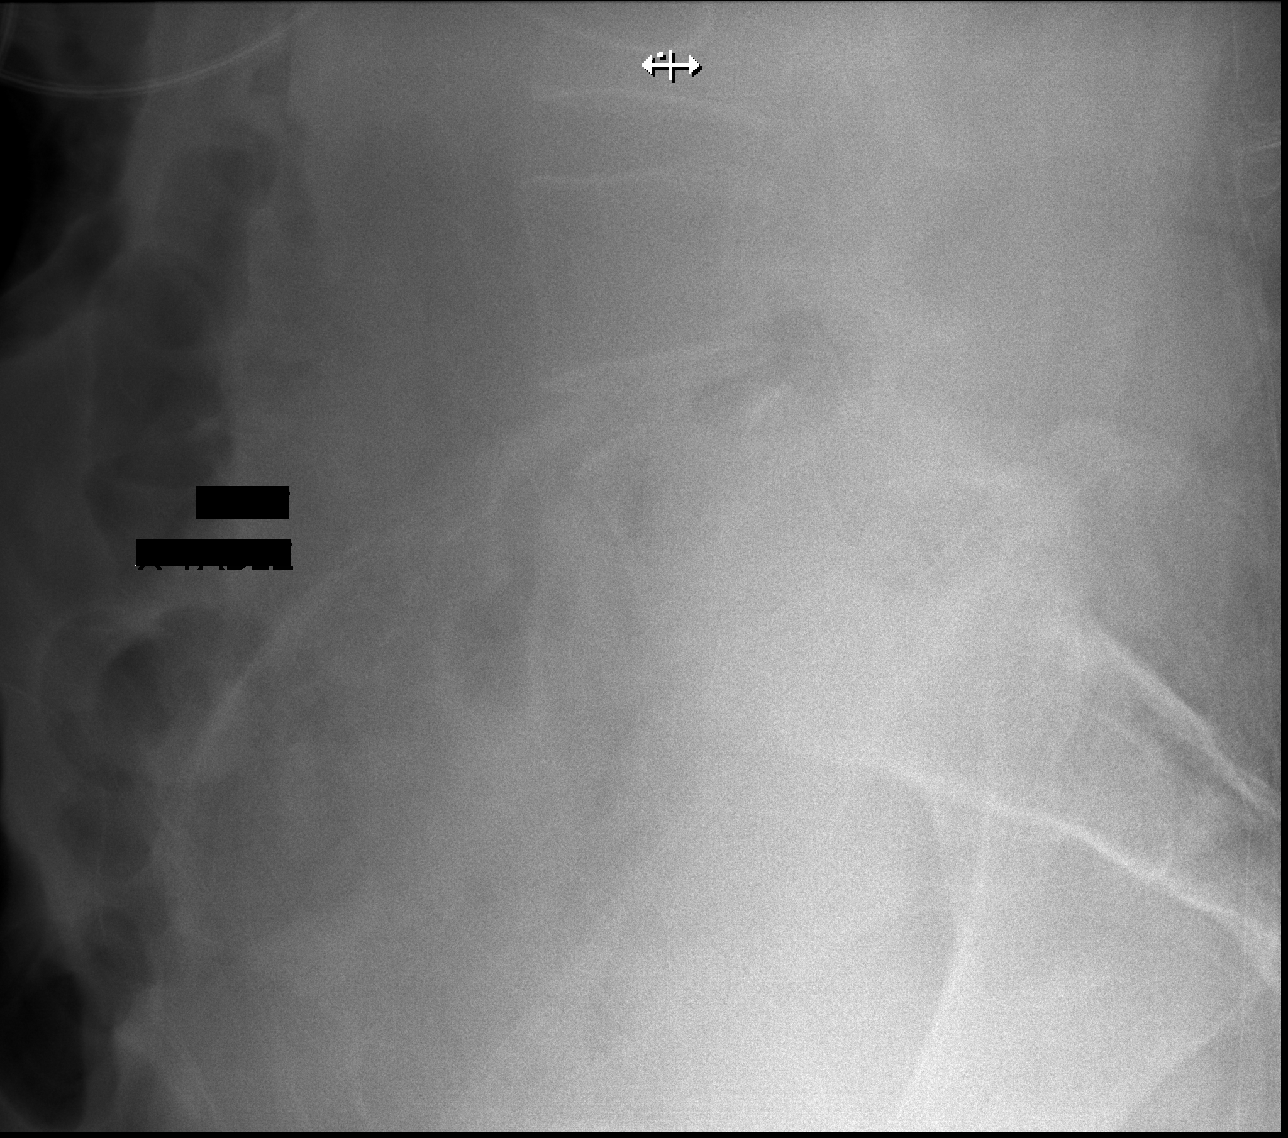

[w lumbar spine lat (2 of 2)]
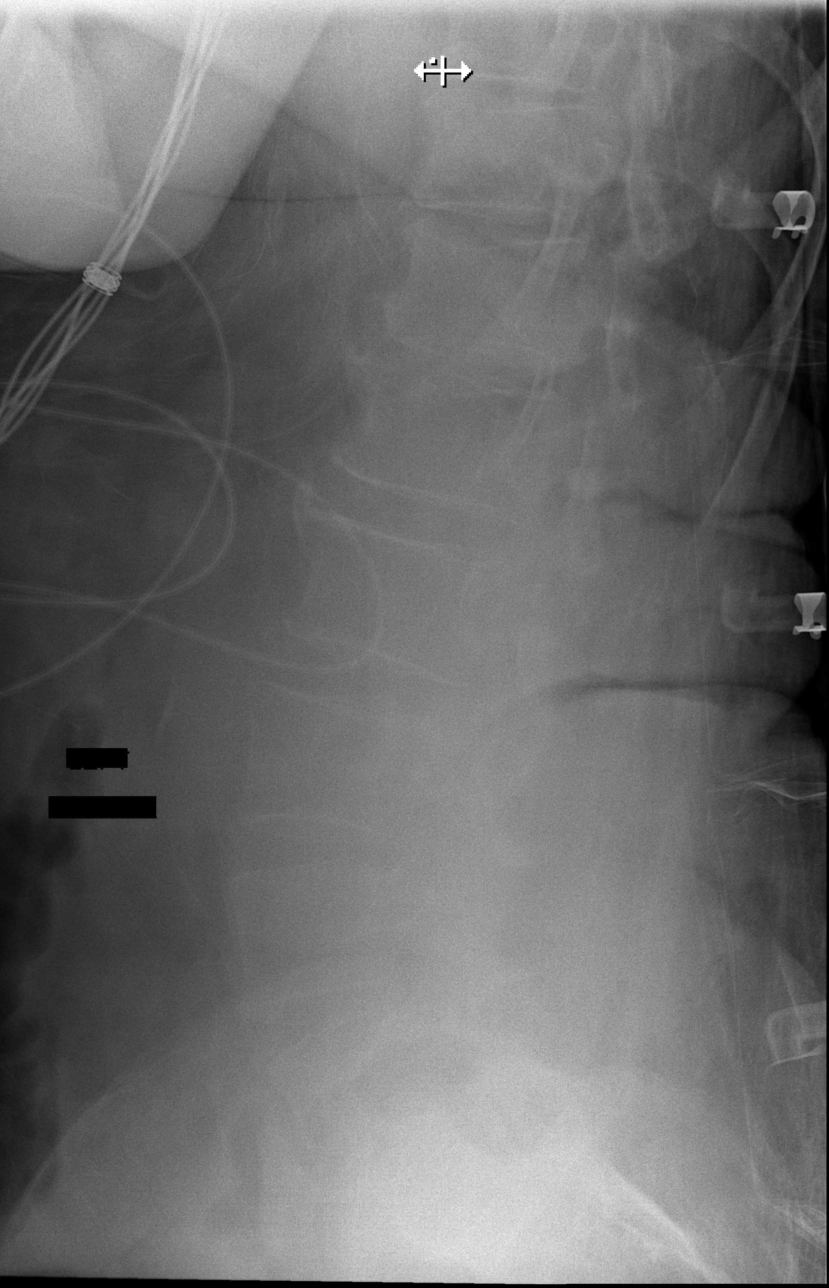

[4 of 4 positions shown; findings below may reference images not displayed]

FINDINGS: Five lumbar type vertebral bodies are present. There is mild
levoscoliosis. Vertebral bodies are otherwise normally aligned with
preservation of the normal lumbar lordosis.

There is anterior wedging of the L1 and L2 vertebral bodies, favored
to be chronic in nature. Vertebral body heights are otherwise
preserved. No definite acute fracture listhesis.

No soft tissue abnormality.

Moderate multilevel degenerative disc disease present within the
visualized spine.
IMPRESSION: 1. No definite acute traumatic injury within the lumbar spine.
2. Anterior wedging of the L1 and L2 vertebral bodies, favored to be
chronic in nature. Possible acute compression fractures are not
entirely excluded. Correlation with site of pain recommended.
